# Patient Record
Sex: Male | Born: 1985 | Race: Black or African American | Hispanic: No | Marital: Single
Health system: Southern US, Community
[De-identification: ages and names within clinical notes are randomized; demographics above are authoritative.]

## PROBLEM LIST (undated history)

## (undated) DIAGNOSIS — S27329A Contusion of lung, unspecified, initial encounter: Secondary | ICD-10-CM

---

## 2013-02-24 DIAGNOSIS — W3400XA Accidental discharge from unspecified firearms or gun, initial encounter: Secondary | ICD-10-CM

## 2013-02-24 HISTORY — DX: Accidental discharge from unspecified firearms or gun, initial encounter: W34.00XA

## 2013-03-06 ENCOUNTER — Inpatient Hospital Stay (HOSPITAL_COMMUNITY): Payer: Self-pay

## 2013-03-06 ENCOUNTER — Inpatient Hospital Stay (HOSPITAL_COMMUNITY)
Admission: EM | Admit: 2013-03-06 | Discharge: 2013-03-09 | DRG: 958 | Disposition: A | Discharge status: Home / Self Care | Payer: Self-pay | Attending: General Surgery | Admitting: General Surgery

## 2013-03-06 ENCOUNTER — Emergency Department (HOSPITAL_COMMUNITY): Payer: Self-pay

## 2013-03-06 ENCOUNTER — Inpatient Hospital Stay (HOSPITAL_COMMUNITY): Payer: Self-pay | Admitting: Anesthesiology

## 2013-03-06 ENCOUNTER — Encounter (HOSPITAL_COMMUNITY): Admission: EM | Disposition: A | Discharge status: Home / Self Care | Payer: Self-pay | Source: Home / Self Care

## 2013-03-06 ENCOUNTER — Encounter (HOSPITAL_COMMUNITY): Payer: Self-pay | Admitting: Emergency Medicine

## 2013-03-06 ENCOUNTER — Encounter (HOSPITAL_COMMUNITY): Payer: Self-pay | Admitting: Anesthesiology

## 2013-03-06 DIAGNOSIS — S51839A Puncture wound without foreign body of unspecified forearm, initial encounter: Secondary | ICD-10-CM

## 2013-03-06 DIAGNOSIS — S50851A Superficial foreign body of right forearm, initial encounter: Secondary | ICD-10-CM | POA: Diagnosis present

## 2013-03-06 DIAGNOSIS — S42109A Fracture of unspecified part of scapula, unspecified shoulder, initial encounter for closed fracture: Secondary | ICD-10-CM

## 2013-03-06 DIAGNOSIS — S27329A Contusion of lung, unspecified, initial encounter: Secondary | ICD-10-CM | POA: Diagnosis present

## 2013-03-06 DIAGNOSIS — S21109A Unspecified open wound of unspecified front wall of thorax without penetration into thoracic cavity, initial encounter: Secondary | ICD-10-CM

## 2013-03-06 DIAGNOSIS — S21339A Puncture wound without foreign body of unspecified front wall of thorax with penetration into thoracic cavity, initial encounter: Secondary | ICD-10-CM

## 2013-03-06 DIAGNOSIS — Z1810 Retained metal fragments, unspecified: Secondary | ICD-10-CM

## 2013-03-06 DIAGNOSIS — S20359A Superficial foreign body of unspecified front wall of thorax, initial encounter: Secondary | ICD-10-CM | POA: Diagnosis present

## 2013-03-06 DIAGNOSIS — S52209A Unspecified fracture of shaft of unspecified ulna, initial encounter for closed fracture: Secondary | ICD-10-CM

## 2013-03-06 DIAGNOSIS — F1910 Other psychoactive substance abuse, uncomplicated: Secondary | ICD-10-CM | POA: Diagnosis present

## 2013-03-06 DIAGNOSIS — S2190XA Unspecified open wound of unspecified part of thorax, initial encounter: Secondary | ICD-10-CM

## 2013-03-06 DIAGNOSIS — F1210 Cannabis abuse, uncomplicated: Secondary | ICD-10-CM | POA: Diagnosis present

## 2013-03-06 DIAGNOSIS — S51809A Unspecified open wound of unspecified forearm, initial encounter: Secondary | ICD-10-CM

## 2013-03-06 DIAGNOSIS — S52201A Unspecified fracture of shaft of right ulna, initial encounter for closed fracture: Secondary | ICD-10-CM

## 2013-03-06 DIAGNOSIS — F1110 Opioid abuse, uncomplicated: Secondary | ICD-10-CM | POA: Diagnosis present

## 2013-03-06 DIAGNOSIS — S21331A Puncture wound without foreign body of right front wall of thorax with penetration into thoracic cavity, initial encounter: Secondary | ICD-10-CM

## 2013-03-06 DIAGNOSIS — R042 Hemoptysis: Secondary | ICD-10-CM | POA: Diagnosis present

## 2013-03-06 DIAGNOSIS — F1010 Alcohol abuse, uncomplicated: Secondary | ICD-10-CM | POA: Diagnosis present

## 2013-03-06 DIAGNOSIS — S2239XA Fracture of one rib, unspecified side, initial encounter for closed fracture: Secondary | ICD-10-CM | POA: Diagnosis present

## 2013-03-06 DIAGNOSIS — S52209B Unspecified fracture of shaft of unspecified ulna, initial encounter for open fracture type I or II: Principal | ICD-10-CM | POA: Diagnosis present

## 2013-03-06 DIAGNOSIS — S27309A Unspecified injury of lung, unspecified, initial encounter: Secondary | ICD-10-CM

## 2013-03-06 DIAGNOSIS — S21131A Puncture wound without foreign body of right front wall of thorax without penetration into thoracic cavity, initial encounter: Secondary | ICD-10-CM

## 2013-03-06 DIAGNOSIS — F17200 Nicotine dependence, unspecified, uncomplicated: Secondary | ICD-10-CM | POA: Diagnosis present

## 2013-03-06 DIAGNOSIS — W3400XA Accidental discharge from unspecified firearms or gun, initial encounter: Secondary | ICD-10-CM

## 2013-03-06 DIAGNOSIS — S27321A Contusion of lung, unilateral, initial encounter: Secondary | ICD-10-CM

## 2013-03-06 HISTORY — PX: FOREIGN BODY REMOVAL: SHX962

## 2013-03-06 HISTORY — PX: ORIF ULNAR FRACTURE: SHX5417

## 2013-03-06 LAB — POCT I-STAT, CHEM 8
BUN: 20 mg/dL (ref 6–23)
Calcium, Ion: 1.1 mmol/L — ABNORMAL LOW (ref 1.12–1.23)
Chloride: 103 mEq/L (ref 96–112)
Creatinine, Ser: 1.2 mg/dL (ref 0.50–1.35)
Glucose, Bld: 181 mg/dL — ABNORMAL HIGH (ref 70–99)
HCT: 46 % (ref 39.0–52.0)
Hemoglobin: 15.6 g/dL (ref 13.0–17.0)
Potassium: 3.4 mEq/L — ABNORMAL LOW (ref 3.7–5.3)
Sodium: 140 mEq/L (ref 137–147)
TCO2: 20 mmol/L (ref 0–100)

## 2013-03-06 LAB — COMPREHENSIVE METABOLIC PANEL
ALT: 16 U/L (ref 0–53)
AST: 26 U/L (ref 0–37)
Albumin: 3.8 g/dL (ref 3.5–5.2)
Alkaline Phosphatase: 43 U/L (ref 39–117)
BUN: 20 mg/dL (ref 6–23)
CO2: 21 mEq/L (ref 19–32)
Calcium: 8.2 mg/dL — ABNORMAL LOW (ref 8.4–10.5)
Chloride: 101 mEq/L (ref 96–112)
Creatinine, Ser: 1.21 mg/dL (ref 0.50–1.35)
GFR calc Af Amer: >90 mL/min (ref >90)
GFR calc non Af Amer: 81 mL/min — ABNORMAL LOW (ref >90)
Glucose, Bld: 177 mg/dL — ABNORMAL HIGH (ref 70–99)
Potassium: 3.7 mEq/L (ref 3.7–5.3)
Sodium: 139 mEq/L (ref 137–147)
Total Bilirubin: 0.2 mg/dL — ABNORMAL LOW (ref 0.3–1.2)
Total Protein: 7.2 g/dL (ref 6.0–8.3)

## 2013-03-06 LAB — CBC
HCT: 40.5 % (ref 39.0–52.0)
Hemoglobin: 13.7 g/dL (ref 13.0–17.0)
MCH: 30.9 pg (ref 26.0–34.0)
MCHC: 33.8 g/dL (ref 30.0–36.0)
MCV: 91.4 fL (ref 78.0–100.0)
Platelets: 211 10*3/uL (ref 150–400)
RBC: 4.43 MIL/uL (ref 4.22–5.81)
RDW: 12.4 % (ref 11.5–15.5)
WBC: 13 10*3/uL — ABNORMAL HIGH (ref 4.0–10.5)

## 2013-03-06 LAB — CG4 I-STAT (LACTIC ACID): Lactic Acid, Venous: 4.18 mmol/L — ABNORMAL HIGH (ref 0.5–2.2)

## 2013-03-06 LAB — PROTIME-INR
INR: 1.24 (ref 0.00–1.49)
Prothrombin Time: 15.3 seconds — ABNORMAL HIGH (ref 11.6–15.2)

## 2013-03-06 LAB — MRSA PCR SCREENING: MRSA by PCR: NEGATIVE

## 2013-03-06 LAB — SAMPLE TO BLOOD BANK

## 2013-03-06 SURGERY — OPEN REDUCTION INTERNAL FIXATION (ORIF) ULNAR FRACTURE
Anesthesia: General | Site: Chest | Laterality: Right

## 2013-03-06 MED ORDER — POLYETHYLENE GLYCOL 3350 17 G PO PACK
17.0000 g | PACK | Freq: Every day | ORAL | Status: DC | PRN
  Filled 2013-03-06: qty 1

## 2013-03-06 MED ORDER — ONDANSETRON HCL 4 MG/2ML IJ SOLN: 4.0000 mg | Freq: Four times a day (QID) | INTRAMUSCULAR | Status: DC | PRN

## 2013-03-06 MED ORDER — PROMETHAZINE HCL 25 MG/ML IJ SOLN: 6.2500 mg | INTRAMUSCULAR | Status: DC | PRN

## 2013-03-06 MED ORDER — PHENOL 1.4 % MT LIQD: 1.0000 | OROMUCOSAL | Status: DC | PRN

## 2013-03-06 MED ORDER — ROCURONIUM BROMIDE 100 MG/10ML IV SOLN
INTRAVENOUS | Status: DC | PRN
  Administered 2013-03-06: 20 mg via INTRAVENOUS

## 2013-03-06 MED ORDER — METHOCARBAMOL 100 MG/ML IJ SOLN
500.0000 mg | Freq: Four times a day (QID) | INTRAVENOUS | Status: DC | PRN
  Filled 2013-03-06: qty 5

## 2013-03-06 MED ORDER — HYDROMORPHONE HCL PF 1 MG/ML IJ SOLN
INTRAMUSCULAR | Status: AC
  Filled 2013-03-06: qty 1

## 2013-03-06 MED ORDER — CEFAZOLIN SODIUM 1-5 GM-% IV SOLN: 1.0000 g | INTRAVENOUS | Status: DC

## 2013-03-06 MED ORDER — OXYCODONE HCL 5 MG/5ML PO SOLN: 5.0000 mg | Freq: Once | ORAL | Status: DC | PRN

## 2013-03-06 MED ORDER — OXYCODONE HCL 5 MG PO TABS
5.0000 mg | ORAL_TABLET | ORAL | Status: DC | PRN
  Administered 2013-03-07 – 2013-03-08 (×4): 10 mg via ORAL
  Filled 2013-03-06 (×4): qty 2

## 2013-03-06 MED ORDER — SUCCINYLCHOLINE CHLORIDE 20 MG/ML IJ SOLN
INTRAMUSCULAR | Status: DC | PRN
  Administered 2013-03-06: 120 mg via INTRAVENOUS

## 2013-03-06 MED ORDER — TETANUS-DIPHTHERIA TOXOIDS TD 5-2 LFU IM INJ: 0.5000 mL | INJECTION | Freq: Once | INTRAMUSCULAR | Status: DC

## 2013-03-06 MED ORDER — FENTANYL CITRATE 0.05 MG/ML IJ SOLN
INTRAMUSCULAR | Status: DC | PRN
  Administered 2013-03-06: 50 ug via INTRAVENOUS
  Administered 2013-03-06: 100 ug via INTRAVENOUS
  Administered 2013-03-06: 50 ug via INTRAVENOUS
  Administered 2013-03-06: 100 ug via INTRAVENOUS
  Administered 2013-03-06: 50 ug via INTRAVENOUS

## 2013-03-06 MED ORDER — HYDROMORPHONE HCL PF 1 MG/ML IJ SOLN
0.2500 mg | INTRAMUSCULAR | Status: DC | PRN
  Administered 2013-03-06 (×4): 0.5 mg via INTRAVENOUS

## 2013-03-06 MED ORDER — FENTANYL CITRATE 0.05 MG/ML IJ SOLN
50.0000 ug | Freq: Once | INTRAMUSCULAR | Status: AC
  Administered 2013-03-06: 50 ug via INTRAVENOUS

## 2013-03-06 MED ORDER — DIPHENHYDRAMINE HCL 50 MG/ML IJ SOLN
INTRAMUSCULAR | Status: DC | PRN
  Administered 2013-03-06: 25 mg via INTRAVENOUS

## 2013-03-06 MED ORDER — PANTOPRAZOLE SODIUM 40 MG PO TBEC: 40.0000 mg | DELAYED_RELEASE_TABLET | Freq: Every day | ORAL | Status: DC

## 2013-03-06 MED ORDER — ONDANSETRON HCL 4 MG PO TABS: 4.0000 mg | ORAL_TABLET | Freq: Four times a day (QID) | ORAL | Status: DC | PRN

## 2013-03-06 MED ORDER — PANTOPRAZOLE SODIUM 40 MG IV SOLR
40.0000 mg | Freq: Every day | INTRAVENOUS | Status: DC
  Administered 2013-03-06 – 2013-03-07 (×2): 40 mg via INTRAVENOUS
  Filled 2013-03-06 (×3): qty 40

## 2013-03-06 MED ORDER — ALUM & MAG HYDROXIDE-SIMETH 200-200-20 MG/5ML PO SUSP: 30.0000 mL | ORAL | Status: DC | PRN

## 2013-03-06 MED ORDER — PROPOFOL 10 MG/ML IV BOLUS
INTRAVENOUS | Status: DC | PRN
  Administered 2013-03-06: 150 mg via INTRAVENOUS

## 2013-03-06 MED ORDER — TETANUS-DIPHTH-ACELL PERTUSSIS 5-2.5-18.5 LF-MCG/0.5 IM SUSP
INTRAMUSCULAR | Status: AC
  Administered 2013-03-06: 0.5 mL via INTRAMUSCULAR
  Filled 2013-03-06: qty 0.5

## 2013-03-06 MED ORDER — LIDOCAINE HCL (CARDIAC) 20 MG/ML IV SOLN
INTRAVENOUS | Status: DC | PRN
  Administered 2013-03-06: 50 mg via INTRAVENOUS

## 2013-03-06 MED ORDER — SENNA 8.6 MG PO TABS
1.0000 | ORAL_TABLET | Freq: Two times a day (BID) | ORAL | Status: DC
  Administered 2013-03-07 – 2013-03-09 (×5): 8.6 mg via ORAL
  Filled 2013-03-06 (×7): qty 1

## 2013-03-06 MED ORDER — BUPIVACAINE HCL (PF) 0.25 % IJ SOLN
INTRAMUSCULAR | Status: AC
  Filled 2013-03-06: qty 30

## 2013-03-06 MED ORDER — HYDROMORPHONE HCL PF 1 MG/ML IJ SOLN
1.0000 mg | INTRAMUSCULAR | Status: DC | PRN
  Administered 2013-03-06 (×4): 1 mg via INTRAVENOUS
  Administered 2013-03-07 (×2): 2 mg via INTRAVENOUS
  Administered 2013-03-07: 1 mg via INTRAVENOUS
  Administered 2013-03-07: 2 mg via INTRAVENOUS
  Administered 2013-03-07: 1 mg via INTRAVENOUS
  Administered 2013-03-07 – 2013-03-08 (×7): 2 mg via INTRAVENOUS
  Filled 2013-03-06: qty 2
  Filled 2013-03-06: qty 1
  Filled 2013-03-06: qty 2
  Filled 2013-03-06 (×2): qty 1
  Filled 2013-03-06: qty 2
  Filled 2013-03-06: qty 1
  Filled 2013-03-06 (×4): qty 2
  Filled 2013-03-06: qty 1
  Filled 2013-03-06 (×3): qty 2

## 2013-03-06 MED ORDER — CEFAZOLIN SODIUM 1-5 GM-% IV SOLN
1.0000 g | Freq: Three times a day (TID) | INTRAVENOUS | Status: DC
  Administered 2013-03-06 (×2): 1 g via INTRAVENOUS
  Filled 2013-03-06 (×3): qty 50

## 2013-03-06 MED ORDER — ONDANSETRON HCL 4 MG/2ML IJ SOLN
INTRAMUSCULAR | Status: DC | PRN
  Administered 2013-03-06: 4 mg via INTRAVENOUS

## 2013-03-06 MED ORDER — ACETAMINOPHEN 325 MG PO TABS: 650.0000 mg | ORAL_TABLET | Freq: Four times a day (QID) | ORAL | Status: DC | PRN

## 2013-03-06 MED ORDER — DIPHENHYDRAMINE HCL 12.5 MG/5ML PO ELIX
12.5000 mg | ORAL_SOLUTION | ORAL | Status: DC | PRN
Start: 2013-03-06 — End: 2013-03-09
  Filled 2013-03-06: qty 10

## 2013-03-06 MED ORDER — BISACODYL 10 MG RE SUPP: 10.0000 mg | Freq: Every day | RECTAL | Status: DC | PRN

## 2013-03-06 MED ORDER — POTASSIUM CHLORIDE IN NACL 20-0.9 MEQ/L-% IV SOLN
INTRAVENOUS | Status: DC
  Administered 2013-03-06 – 2013-03-07 (×2): via INTRAVENOUS
  Filled 2013-03-06 (×4): qty 1000

## 2013-03-06 MED ORDER — LACTATED RINGERS IV SOLN
INTRAVENOUS | Status: DC | PRN
  Administered 2013-03-06 (×2): via INTRAVENOUS

## 2013-03-06 MED ORDER — GLYCOPYRROLATE 0.2 MG/ML IJ SOLN
INTRAMUSCULAR | Status: DC | PRN
  Administered 2013-03-06: 0.4 mg via INTRAVENOUS

## 2013-03-06 MED ORDER — NEOSTIGMINE METHYLSULFATE 1 MG/ML IJ SOLN
INTRAMUSCULAR | Status: DC | PRN
  Administered 2013-03-06: 3 mg via INTRAVENOUS

## 2013-03-06 MED ORDER — OXYCODONE HCL 5 MG PO TABS: 5.0000 mg | ORAL_TABLET | Freq: Once | ORAL | Status: DC | PRN

## 2013-03-06 MED ORDER — METHOCARBAMOL 500 MG PO TABS
500.0000 mg | ORAL_TABLET | Freq: Four times a day (QID) | ORAL | Status: DC | PRN
  Administered 2013-03-08 (×2): 500 mg via ORAL
  Filled 2013-03-06 (×3): qty 1

## 2013-03-06 MED ORDER — ACETAMINOPHEN 650 MG RE SUPP: 650.0000 mg | Freq: Four times a day (QID) | RECTAL | Status: DC | PRN

## 2013-03-06 MED ORDER — BUPIVACAINE HCL (PF) 0.25 % IJ SOLN
INTRAMUSCULAR | Status: DC | PRN
  Administered 2013-03-06: 20 mL

## 2013-03-06 MED ORDER — IOHEXOL 300 MG/ML  SOLN
80.0000 mL | Freq: Once | INTRAMUSCULAR | Status: AC | PRN
  Administered 2013-03-06: 80 mL via INTRAVENOUS

## 2013-03-06 MED ORDER — FENTANYL CITRATE 0.05 MG/ML IJ SOLN
INTRAMUSCULAR | Status: AC
  Filled 2013-03-06: qty 2

## 2013-03-06 MED ORDER — MIDAZOLAM HCL 5 MG/5ML IJ SOLN
INTRAMUSCULAR | Status: DC | PRN
  Administered 2013-03-06: 2 mg via INTRAVENOUS

## 2013-03-06 MED ORDER — MENTHOL 3 MG MT LOZG: 1.0000 | LOZENGE | OROMUCOSAL | Status: DC | PRN

## 2013-03-06 MED ORDER — HYDROMORPHONE HCL PF 1 MG/ML IJ SOLN
INTRAMUSCULAR | Status: AC
  Administered 2013-03-06: 1 mg via INTRAVENOUS
  Filled 2013-03-06: qty 1

## 2013-03-06 MED ORDER — HYDROMORPHONE HCL PF 1 MG/ML IJ SOLN
1.0000 mg | Freq: Once | INTRAMUSCULAR | Status: AC
  Administered 2013-03-06: 1 mg via INTRAVENOUS
  Filled 2013-03-06: qty 1

## 2013-03-06 MED ORDER — CEFAZOLIN SODIUM 1-5 GM-% IV SOLN
1.0000 g | Freq: Four times a day (QID) | INTRAVENOUS | Status: AC
  Administered 2013-03-07 (×3): 1 g via INTRAVENOUS
  Filled 2013-03-06 (×4): qty 50

## 2013-03-06 MED ORDER — ZOLPIDEM TARTRATE 5 MG PO TABS: 5.0000 mg | ORAL_TABLET | Freq: Every evening | ORAL | Status: DC | PRN

## 2013-03-06 MED ORDER — DOCUSATE SODIUM 100 MG PO CAPS
100.0000 mg | ORAL_CAPSULE | Freq: Two times a day (BID) | ORAL | Status: DC
  Administered 2013-03-07 – 2013-03-09 (×5): 100 mg via ORAL
  Filled 2013-03-06 (×8): qty 1

## 2013-03-06 MED ORDER — OXYCODONE-ACETAMINOPHEN 5-325 MG PO TABS
1.0000 | ORAL_TABLET | ORAL | Status: DC | PRN
  Filled 2013-03-06: qty 2

## 2013-03-06 MED ORDER — CEFAZOLIN SODIUM 1-5 GM-% IV SOLN
1.0000 g | Freq: Once | INTRAVENOUS | Status: AC
  Administered 2013-03-06: 2 g via INTRAVENOUS

## 2013-03-06 MED ORDER — ENOXAPARIN SODIUM 40 MG/0.4ML ~~LOC~~ SOLN
40.0000 mg | Freq: Every day | SUBCUTANEOUS | Status: DC
  Administered 2013-03-07 – 2013-03-09 (×3): 40 mg via SUBCUTANEOUS
  Filled 2013-03-06 (×3): qty 0.4

## 2013-03-06 SURGICAL SUPPLY — 89 items
BANDAGE ELASTIC 3 VELCRO ST LF (GAUZE/BANDAGES/DRESSINGS) ×4 IMPLANT
BANDAGE ELASTIC 4 VELCRO ST LF (GAUZE/BANDAGES/DRESSINGS) ×4 IMPLANT
BENZOIN TINCTURE PRP APPL 2/3 (GAUZE/BANDAGES/DRESSINGS) ×4 IMPLANT
BIT DRILL 2.5X110 QC LCP DISP (BIT) ×4 IMPLANT
BLADE SURG ROTATE 9660 (MISCELLANEOUS) IMPLANT
BNDG COHESIVE 4X5 TAN STRL (GAUZE/BANDAGES/DRESSINGS) ×4 IMPLANT
BNDG ESMARK 4X9 LF (GAUZE/BANDAGES/DRESSINGS) ×4 IMPLANT
CLOSURE STERI-STRIP 1/2X4 (GAUZE/BANDAGES/DRESSINGS) ×1
CLOTH BEACON ORANGE TIMEOUT ST (SAFETY) ×4 IMPLANT
CLSR STERI-STRIP ANTIMIC 1/2X4 (GAUZE/BANDAGES/DRESSINGS) ×3 IMPLANT
CONT SPEC 4OZ CLIKSEAL STRL BL (MISCELLANEOUS) ×8 IMPLANT
CORDS BIPOLAR (ELECTRODE) ×4 IMPLANT
COVER SURGICAL LIGHT HANDLE (MISCELLANEOUS) ×4 IMPLANT
CUFF TOURNIQUET SINGLE 18IN (TOURNIQUET CUFF) ×4 IMPLANT
CUFF TOURNIQUET SINGLE 24IN (TOURNIQUET CUFF) IMPLANT
DECANTER SPIKE VIAL GLASS SM (MISCELLANEOUS) IMPLANT
DRAPE C-ARM 42X72 X-RAY (DRAPES) ×4 IMPLANT
DRAPE INCISE IOBAN 66X45 STRL (DRAPES) IMPLANT
DRAPE OEC MINIVIEW 54X84 (DRAPES) IMPLANT
DRAPE ORTHO SPLIT 77X108 STRL (DRAPES) ×2
DRAPE SURG ORHT 6 SPLT 77X108 (DRAPES) ×2 IMPLANT
DRAPE U-SHAPE 47X51 STRL (DRAPES) ×4 IMPLANT
DRSG MEPILEX BORDER 4X4 (GAUZE/BANDAGES/DRESSINGS) ×4 IMPLANT
ELECT REM PT RETURN 9FT ADLT (ELECTROSURGICAL)
ELECTRODE REM PT RTRN 9FT ADLT (ELECTROSURGICAL) IMPLANT
GAUZE XEROFORM 1X8 LF (GAUZE/BANDAGES/DRESSINGS) ×4 IMPLANT
GLOVE BIO SURGEON STRL SZ7.5 (GLOVE) ×4 IMPLANT
GLOVE BIO SURGEON STRL SZ8 (GLOVE) ×8 IMPLANT
GLOVE BIOGEL PI IND STRL 8 (GLOVE) ×2 IMPLANT
GLOVE BIOGEL PI INDICATOR 8 (GLOVE) ×2
GLOVE BIOGEL PI ORTHO PRO SZ8 (GLOVE) ×2
GLOVE ORTHO TXT STRL SZ7.5 (GLOVE) IMPLANT
GLOVE PI ORTHO PRO STRL SZ8 (GLOVE) ×2 IMPLANT
GLOVE SS BIOGEL STRL SZ 8 (GLOVE) ×4 IMPLANT
GLOVE SUPERSENSE BIOGEL SZ 8 (GLOVE) ×4
GLOVE SURG ORTHO 8.0 STRL STRW (GLOVE) ×4 IMPLANT
GOWN BRE IMP SLV AUR XL STRL (GOWN DISPOSABLE) ×4 IMPLANT
GOWN STRL NON-REIN LRG LVL3 (GOWN DISPOSABLE) ×8 IMPLANT
GOWN STRL REIN 3XL LVL4 (GOWN DISPOSABLE) ×4 IMPLANT
KIT BASIN OR (CUSTOM PROCEDURE TRAY) ×4 IMPLANT
KIT ROOM TURNOVER OR (KITS) ×4 IMPLANT
LOOP VESSEL MAXI BLUE (MISCELLANEOUS) IMPLANT
MANIFOLD NEPTUNE II (INSTRUMENTS) ×4 IMPLANT
NEEDLE 22X1 1/2 (OR ONLY) (NEEDLE) IMPLANT
NEEDLE BLUNT 16X1.5 OR ONLY (NEEDLE) IMPLANT
NS IRRIG 1000ML POUR BTL (IV SOLUTION) ×4 IMPLANT
PACK ORTHO EXTREMITY (CUSTOM PROCEDURE TRAY) ×4 IMPLANT
PAD ARMBOARD 7.5X6 YLW CONV (MISCELLANEOUS) ×8 IMPLANT
PAD CAST 4YDX4 CTTN HI CHSV (CAST SUPPLIES) ×4 IMPLANT
PADDING CAST ABS 4INX4YD NS (CAST SUPPLIES) ×2
PADDING CAST ABS COTTON 4X4 ST (CAST SUPPLIES) ×2 IMPLANT
PADDING CAST COTTON 4X4 STRL (CAST SUPPLIES) ×4
PROS LCP PLATE 14 189M (Plate) ×4 IMPLANT
PROSTHESIS LCP PLATE 14 189M (Plate) ×2 IMPLANT
SCREW CORTEX 3.5 16MM (Screw) ×4 IMPLANT
SCREW CORTEX 3.5 18MM (Screw) ×4 IMPLANT
SCREW CORTEX 3.5 22MM (Screw) ×2 IMPLANT
SCREW CORTEX 3.5 26MM (Screw) ×2 IMPLANT
SCREW CORTEX 3.5 28MM (Screw) ×2 IMPLANT
SCREW CORTEX 3.5 32MM (Screw) ×2 IMPLANT
SCREW LOCK CORT ST 3.5X16 (Screw) ×4 IMPLANT
SCREW LOCK CORT ST 3.5X18 (Screw) ×4 IMPLANT
SCREW LOCK CORT ST 3.5X22 (Screw) ×2 IMPLANT
SCREW LOCK CORT ST 3.5X26 (Screw) ×2 IMPLANT
SCREW LOCK CORT ST 3.5X28 (Screw) ×2 IMPLANT
SCREW LOCK CORT ST 3.5X32 (Screw) ×2 IMPLANT
SLING ARM FOAM STRAP LRG (SOFTGOODS) ×4 IMPLANT
SPLINT PLASTER CAST XFAST 5X30 (CAST SUPPLIES) ×2 IMPLANT
SPLINT PLASTER XFAST SET 5X30 (CAST SUPPLIES) ×2
SPONGE GAUZE 4X4 12PLY (GAUZE/BANDAGES/DRESSINGS) ×12 IMPLANT
SPONGE GAUZE 4X4 12PLY STER LF (GAUZE/BANDAGES/DRESSINGS) ×4 IMPLANT
SPONGE LAP 4X18 X RAY DECT (DISPOSABLE) IMPLANT
STAPLER VISISTAT 35W (STAPLE) IMPLANT
SUCTION FRAZIER TIP 10 FR DISP (SUCTIONS) ×4 IMPLANT
SUT ETHILON 3 0 PS 1 (SUTURE) ×8 IMPLANT
SUT ETHILON 4 0 PS 2 18 (SUTURE) IMPLANT
SUT MNCRL AB 4-0 PS2 18 (SUTURE) ×4 IMPLANT
SUT PROLENE 4 0 PS 2 18 (SUTURE) IMPLANT
SUT VIC AB 0 CT1 27 (SUTURE) ×2
SUT VIC AB 0 CT1 27XBRD ANBCTR (SUTURE) ×2 IMPLANT
SUT VIC AB 3-0 FS2 27 (SUTURE) IMPLANT
SUT VIC AB 3-0 SH 8-18 (SUTURE) ×4 IMPLANT
SYR CONTROL 10ML LL (SYRINGE) ×4 IMPLANT
TOWEL OR 17X24 6PK STRL BLUE (TOWEL DISPOSABLE) ×4 IMPLANT
TOWEL OR 17X26 10 PK STRL BLUE (TOWEL DISPOSABLE) ×4 IMPLANT
TUBE CONNECTING 12'X1/4 (SUCTIONS) ×2
TUBE CONNECTING 12X1/4 (SUCTIONS) ×6 IMPLANT
WATER STERILE IRR 1000ML POUR (IV SOLUTION) IMPLANT
YANKAUER SUCT BULB TIP NO VENT (SUCTIONS) IMPLANT

## 2013-03-06 NOTE — Progress Notes (Signed)
Orthopedic Tech Progress Note Patient Details:  [name] Ford 04/02/1985 [id number]  Ortho Devices Type of Ortho Device: Ace wrap;Long arm splint Ortho Device/Splint Location: rue Ortho Device/Splint Interventions: Application   Ronnell Makarewicz 03/06/2013, 9:26 AM

## 2013-03-06 NOTE — ED Notes (Signed)
md at bedside to eval pt. Pt assisted with urinal and spo2 dropped to 85%, placed on 2L [place] and spo2 99%.

## 2013-03-06 NOTE — ED Notes (Signed)
Patient transported to CT with RN present. 
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
## 2013-03-06 NOTE — ED Notes (Signed)
Ortho md at bedside to eval pt
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
## 2013-03-06 NOTE — Transfer of Care (Signed)
Immediate Anesthesia Transfer of Care Note  Patient: [name] Ford  Procedure(s) Performed: Procedure(s): OPEN REDUCTION INTERNAL FIXATION (ORIF) ULNAR FRACTURE (Right) REMOVAL FOREIGN BODY RIGHT CHEST (Right)  Patient Location: PACU  Anesthesia Type:General  Level of Consciousness: sedated  Airway & Oxygen Therapy: Patient Spontanous Breathing and Patient connected to face mask oxygen  Post-op Assessment: Report given to PACU RN and Post -op Vital signs reviewed and stable  Post vital signs: Reviewed and stable  Complications: No apparent anesthesia complications

## 2013-03-06 NOTE — Anesthesia Procedure Notes (Signed)
Procedure Name: Intubation Date/Time: 03/06/2013 7:20 PM Performed by: Brien MatesMAHONY, Mj Willis D Pre-anesthesia Checklist: Patient identified, Emergency Drugs available, Suction available, Patient being monitored and Timeout performed Patient Re-evaluated:Patient Re-evaluated prior to inductionOxygen Delivery Method: Circle system utilized Preoxygenation: Pre-oxygenation with 100% oxygen Intubation Type: IV induction, Rapid sequence and Cricoid Pressure applied Laryngoscope Size: Miller and 2 Grade View: Grade I Tube type: Oral Tube size: 8.0 mm Number of attempts: 1 Airway Equipment and Method: Stylet Placement Confirmation: ETT inserted through vocal cords under direct vision,  positive ETCO2 and breath sounds checked- equal and bilateral Secured at: 22 cm Tube secured with: Tape Dental Injury: Teeth and Oropharynx as per pre-operative assessment

## 2013-03-06 NOTE — ED Notes (Signed)
50 mcg of fentanyl given to patient by Maryellen PileEd White RN, the other 50 mcg were wasted. Waste witnessed by Diona FoleyMorgan Barri Neidlinger RN, Vesta MixerKoula Pate RN
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
## 2013-03-06 NOTE — Preoperative (Signed)
Beta Blockers   Reason not to administer Beta Blockers:Not Applicable 
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
## 2013-03-06 NOTE — Anesthesia Preprocedure Evaluation (Addendum)
Anesthesia Evaluation  Patient identified by MRN, date of birth, ID band Patient awake    Reviewed: Allergy & Precautions, H&P , NPO status , Patient's Chart, lab work & pertinent test results  Airway Mallampati: I  Neck ROM: Full    Dental  (+) Dental Advisory Given and Teeth Intact   Pulmonary Current Smoker,  + rhonchi         Cardiovascular Rhythm:Regular Rate:Normal     Neuro/Psych    GI/Hepatic   Endo/Other    Renal/GU      Musculoskeletal   Abdominal   Peds  Hematology   Anesthesia Other Findings   Reproductive/Obstetrics                         Anesthesia Physical Anesthesia Plan  ASA: I and emergent  Anesthesia Plan: General   Post-op Pain Management:    Induction: Intravenous  Airway Management Planned: Oral ETT  Additional Equipment:   Intra-op Plan:   Post-operative Plan: Extubation in OR  Informed Consent: I have reviewed the patients History and Physical, chart, labs and discussed the procedure including the risks, benefits and alternatives for the proposed anesthesia with the patient or authorized representative who has indicated his/her understanding and acceptance.   Dental advisory given  Plan Discussed with: CRNA, Surgeon and Anesthesiologist  Anesthesia Plan Comments:        Anesthesia Quick Evaluation

## 2013-03-06 NOTE — Anesthesia Postprocedure Evaluation (Signed)
  Anesthesia Post-op Note  Patient: [name] Ford  Procedure(s) Performed: Procedure(s): OPEN REDUCTION INTERNAL FIXATION (ORIF) ULNAR FRACTURE (Right) REMOVAL FOREIGN BODY RIGHT CHEST (Right)  Patient Location: PACU  Anesthesia Type:General  Level of Consciousness: awake and sedated  Airway and Oxygen Therapy: Patient Spontanous Breathing  Post-op Pain: mild  Post-op Assessment: Post-op Vital signs reviewed  Post-op Vital Signs: stable  Complications: No apparent anesthesia complications

## 2013-03-06 NOTE — Progress Notes (Signed)
Pt taken to short stay waiting and bedside [facility]. Pt resting with no complaints or distress
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
## 2013-03-06 NOTE — ED Notes (Signed)
Ortho tech here to apply arm splint

## 2013-03-07 ENCOUNTER — Inpatient Hospital Stay (HOSPITAL_COMMUNITY): Payer: Self-pay

## 2013-03-07 ENCOUNTER — Encounter (HOSPITAL_COMMUNITY): Payer: Self-pay | Admitting: Orthopedic Surgery

## 2013-03-07 DIAGNOSIS — S27321A Contusion of lung, unilateral, initial encounter: Secondary | ICD-10-CM

## 2013-03-07 LAB — BASIC METABOLIC PANEL
BUN: 9 mg/dL (ref 6–23)
CO2: 23 mEq/L (ref 19–32)
Calcium: 8.1 mg/dL — ABNORMAL LOW (ref 8.4–10.5)
Chloride: 104 mEq/L (ref 96–112)
Creatinine, Ser: 1.02 mg/dL (ref 0.50–1.35)
GFR calc Af Amer: >90 mL/min (ref >90)
GFR calc non Af Amer: >90 mL/min (ref >90)
Glucose, Bld: 103 mg/dL — ABNORMAL HIGH (ref 70–99)
Potassium: 4 mEq/L (ref 3.7–5.3)
Sodium: 138 mEq/L (ref 137–147)

## 2013-03-07 LAB — CBC
HCT: 37.1 % — ABNORMAL LOW (ref 39.0–52.0)
Hemoglobin: 13.3 g/dL (ref 13.0–17.0)
MCH: 31.9 pg (ref 26.0–34.0)
MCHC: 35.8 g/dL (ref 30.0–36.0)
MCV: 89 fL (ref 78.0–100.0)
Platelets: 197 10*3/uL (ref 150–400)
RBC: 4.17 MIL/uL — ABNORMAL LOW (ref 4.22–5.81)
RDW: 12.2 % (ref 11.5–15.5)
WBC: 10.4 10*3/uL (ref 4.0–10.5)

## 2013-03-07 NOTE — Progress Notes (Signed)
Orthopedic Tech Progress Note Patient Details:  [name] 01/22/1986 [id number] OHF applied to bed. Arm sling provided for comfort use; not applied at this time because patient was asleep. Application instructions explained to patient's family.   Ortho Devices Type of Ortho Device: Arm sling Ortho Device/Splint Location: rue Ortho Device/Splint Interventions: Ordered   Asia R Janee Mornhompson 03/07/2013, 1:09 PM
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
## 2013-03-07 NOTE — Progress Notes (Signed)
OT Cancellation Note  Patient Details Name: [name] Ford MRN: [id number] DOB: 08/18/1985   Cancelled Treatment:    Reason Eval/Treat Not Completed: Pain limiting ability to participate (pt asking for therapist to return later. Will see in am)  [facility] Childrens HouseWARD,HILLARY Daoud Lobue, OTR/L  [phone] 03/07/2013 03/07/2013, 5:35 PM
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
## 2013-03-08 ENCOUNTER — Encounter (HOSPITAL_COMMUNITY): Payer: Self-pay | Admitting: General Practice

## 2013-03-08 DIAGNOSIS — S51839A Puncture wound without foreign body of unspecified forearm, initial encounter: Secondary | ICD-10-CM

## 2013-03-08 DIAGNOSIS — W3400XA Accidental discharge from unspecified firearms or gun, initial encounter: Secondary | ICD-10-CM

## 2013-03-08 LAB — CBC
HCT: 36.9 % — ABNORMAL LOW (ref 39.0–52.0)
Hemoglobin: 13 g/dL (ref 13.0–17.0)
MCH: 31.6 pg (ref 26.0–34.0)
MCHC: 35.2 g/dL (ref 30.0–36.0)
MCV: 89.8 fL (ref 78.0–100.0)
Platelets: 153 10*3/uL (ref 150–400)
RBC: 4.11 MIL/uL — ABNORMAL LOW (ref 4.22–5.81)
RDW: 12.2 % (ref 11.5–15.5)
WBC: 9.7 10*3/uL (ref 4.0–10.5)

## 2013-03-08 MED ORDER — TRAMADOL HCL 50 MG PO TABS
100.0000 mg | ORAL_TABLET | Freq: Four times a day (QID) | ORAL | Status: DC
  Administered 2013-03-08 – 2013-03-09 (×4): 100 mg via ORAL
  Filled 2013-03-08 (×4): qty 2

## 2013-03-08 MED ORDER — HYDROMORPHONE HCL PF 1 MG/ML IJ SOLN
0.5000 mg | INTRAMUSCULAR | Status: DC | PRN
  Administered 2013-03-08 – 2013-03-09 (×2): 0.5 mg via INTRAVENOUS
  Filled 2013-03-08 (×3): qty 1

## 2013-03-08 MED ORDER — MORPHINE SULFATE ER 15 MG PO TBCR
15.0000 mg | EXTENDED_RELEASE_TABLET | Freq: Two times a day (BID) | ORAL | Status: DC
  Administered 2013-03-08 – 2013-03-09 (×2): 15 mg via ORAL
  Filled 2013-03-08 (×3): qty 1

## 2013-03-08 MED ORDER — OXYCODONE HCL 5 MG PO TABS
10.0000 mg | ORAL_TABLET | ORAL | Status: DC | PRN
  Administered 2013-03-08 – 2013-03-09 (×5): 20 mg via ORAL
  Filled 2013-03-08 (×5): qty 4

## 2013-03-08 MED ORDER — HYDROMORPHONE HCL PF 1 MG/ML IJ SOLN
1.0000 mg | INTRAMUSCULAR | Status: DC | PRN
  Administered 2013-03-08 (×2): 1 mg via INTRAVENOUS
  Filled 2013-03-08 (×2): qty 1
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
## 2013-03-08 NOTE — H&P (Signed)
Patient agitated and stated he has already answered these questions .  Writer informed patient that she only intends to ask questions that where not documented and patient angry and refuses to answer . Writer stopped admission assessment for patient comfort /satisfaction.

## 2013-03-08 NOTE — Progress Notes (Signed)
UR completed.  Arihant Pennings, RN BSN [facility] CCM Trauma/Neuro ICU Case Manager [phone]  
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
## 2013-03-08 NOTE — Progress Notes (Signed)
Walking in hall with therapies. Working on pain control which is an issue - likely complicated by his recreational use of prescription pain meds. Patient examined and I agree with the assessment and plan  Violeta GelinasBurke Hallel Denherder, MD, MPH, FACS Pager: [phone]  03/08/2013 12:35 PM

## 2013-03-08 NOTE — Progress Notes (Signed)
Agree with PTA.    Koreen Lizaola, PT [phone]  

## 2013-03-09 DIAGNOSIS — F1910 Other psychoactive substance abuse, uncomplicated: Secondary | ICD-10-CM | POA: Diagnosis present

## 2013-03-09 DIAGNOSIS — S20359A Superficial foreign body of unspecified front wall of thorax, initial encounter: Secondary | ICD-10-CM | POA: Diagnosis present

## 2013-03-09 DIAGNOSIS — S50851A Superficial foreign body of right forearm, initial encounter: Secondary | ICD-10-CM | POA: Diagnosis present

## 2013-03-09 MED ORDER — TRAMADOL HCL 50 MG PO TABS
100.0000 mg | ORAL_TABLET | Freq: Four times a day (QID) | ORAL | Status: DC
Start: 2013-03-09 — End: 2019-06-01

## 2013-03-09 MED ORDER — METHOCARBAMOL 500 MG PO TABS: 500.0000 mg | ORAL_TABLET | Freq: Four times a day (QID) | ORAL | Status: DC | PRN

## 2013-03-09 MED ORDER — MORPHINE SULFATE ER 15 MG PO TBCR: EXTENDED_RELEASE_TABLET | ORAL | Status: DC

## 2013-03-09 MED ORDER — OXYCODONE-ACETAMINOPHEN 10-325 MG PO TABS: 1.0000 | ORAL_TABLET | ORAL | Status: DC | PRN
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
## 2013-03-09 NOTE — Progress Notes (Signed)
Stable for discharge This patient has been seen and I agree with the findings and treatment plan.  Marta LamasJames O. Gae BonWyatt, III, MD, FACS [phone] (pager) [phone] (direct pager) Trauma Surgeon
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
## 2013-03-09 NOTE — Discharge Summary (Signed)
Patient stable for discharge.  This patient has been seen and I agree with the findings and treatment plan.  Marta LamasJames O. Gae BonWyatt, III, MD, FACS [phone] (pager) [phone] (direct pager) Trauma Surgeon

## 2013-03-09 NOTE — Discharge Instructions (Signed)
Ulnar Fracture You have a fracture (broken bone) of the forearm. This is the part of your arm between the elbow and your wrist. Your forearm is made up of two bones. These are the radius and ulna. Your fracture is in the ulna. This is the bone in your forearm located on the little finger side of your forearm. A cast or splint is used to protect and keep your injured bone from moving. The cast or splint will be on generally for about 5 to 6 weeks, with individual variations. HOME CARE INSTRUCTIONS   Keep the injured part elevated while sitting or lying down. Keep the injury above the level of your heart (the center of the chest). This will decrease swelling and pain.  Apply ice to the injury for 15-20 minutes, 03-04 times per day while awake, for 2 days. Put the ice in a plastic bag and place a towel between the bag of ice and your cast or splint.  Move your fingers to avoid stiffness and minimize swelling.  If you have a plaster or fiberglass cast:  Do not try to scratch the skin under the cast using sharp or pointed objects.  Check the skin around the cast every day. You may put lotion on any red or sore areas.  Keep your cast dry and clean.  If you have a plaster splint:  Wear the splint as directed.  You may loosen the elastic around the splint if your fingers become numb, tingle, or turn cold or blue.  Do not put pressure on any part of your cast or splint. It may break. Rest your cast only on a pillow the first 24 hours until it is fully hardened.  Your cast or splint can be protected during bathing with a plastic bag. Do not lower the cast or splint into water.  Only take over-the-counter or prescription medicines for pain, discomfort, or fever as directed by your caregiver. SEEK IMMEDIATE MEDICAL CARE IF:   Your cast gets damaged or breaks.  You have more severe pain or swelling than you did before the cast.  You have severe pain when stretching your fingers.  There is a  bad smell or new stains and/or purulent (pus like) drainage coming from under the cast. Document Released: 07/24/2005 Document Revised: 05/05/2011 Document Reviewed: 12/26/2006 [facility] Patient Information 2014 [employer], [place].   Leave splint intact and keep dry. For other wounds, Wash daily in shower with soap and water. Do not soak. Apply antibiotic ointment (e.g. Neosporin) twice daily and as needed to keep moist. Cover with dry dressing.  No driving while taking oxycodone or morphine.
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
## 2013-03-14 NOTE — Progress Notes (Signed)
Agree with above 
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
## 2019-05-04 ENCOUNTER — Inpatient Hospital Stay (HOSPITAL_COMMUNITY): Payer: Medicaid Other | Admitting: Certified Registered Nurse Anesthetist

## 2019-05-04 ENCOUNTER — Emergency Department (HOSPITAL_COMMUNITY): Payer: Medicaid Other

## 2019-05-04 ENCOUNTER — Encounter (HOSPITAL_COMMUNITY): Admission: EM | Disposition: A | Discharge status: Rehabilitation Facility | Payer: Self-pay | Source: Home / Self Care

## 2019-05-04 ENCOUNTER — Inpatient Hospital Stay (HOSPITAL_COMMUNITY)
Admission: EM | Admit: 2019-05-04 | Discharge: 2019-05-11 | DRG: 957 | Disposition: A | Discharge status: Rehabilitation Facility | Payer: Medicaid Other | Attending: Surgery | Admitting: Surgery

## 2019-05-04 ENCOUNTER — Encounter (HOSPITAL_COMMUNITY): Payer: Self-pay | Admitting: *Deleted

## 2019-05-04 ENCOUNTER — Inpatient Hospital Stay (HOSPITAL_COMMUNITY): Payer: Medicaid Other

## 2019-05-04 ENCOUNTER — Other Ambulatory Visit: Payer: Self-pay

## 2019-05-04 DIAGNOSIS — K592 Neurogenic bowel, not elsewhere classified: Secondary | ICD-10-CM | POA: Diagnosis not present

## 2019-05-04 DIAGNOSIS — K5900 Constipation, unspecified: Secondary | ICD-10-CM | POA: Diagnosis not present

## 2019-05-04 DIAGNOSIS — M4184 Other forms of scoliosis, thoracic region: Secondary | ICD-10-CM | POA: Diagnosis present

## 2019-05-04 DIAGNOSIS — S12000A Unspecified displaced fracture of first cervical vertebra, initial encounter for closed fracture: Secondary | ICD-10-CM | POA: Diagnosis present

## 2019-05-04 DIAGNOSIS — Y92410 Unspecified street and highway as the place of occurrence of the external cause: Secondary | ICD-10-CM | POA: Diagnosis not present

## 2019-05-04 DIAGNOSIS — S22039D Unspecified fracture of third thoracic vertebra, subsequent encounter for fracture with routine healing: Secondary | ICD-10-CM | POA: Diagnosis not present

## 2019-05-04 DIAGNOSIS — S0231XD Fracture of orbital floor, right side, subsequent encounter for fracture with routine healing: Secondary | ICD-10-CM | POA: Diagnosis not present

## 2019-05-04 DIAGNOSIS — S14159A Other incomplete lesion at unspecified level of cervical spinal cord, initial encounter: Secondary | ICD-10-CM | POA: Diagnosis present

## 2019-05-04 DIAGNOSIS — G8220 Paraplegia, unspecified: Secondary | ICD-10-CM | POA: Diagnosis present

## 2019-05-04 DIAGNOSIS — S0231XA Fracture of orbital floor, right side, initial encounter for closed fracture: Secondary | ICD-10-CM | POA: Diagnosis present

## 2019-05-04 DIAGNOSIS — S12600A Unspecified displaced fracture of seventh cervical vertebra, initial encounter for closed fracture: Secondary | ICD-10-CM | POA: Diagnosis present

## 2019-05-04 DIAGNOSIS — D62 Acute posthemorrhagic anemia: Secondary | ICD-10-CM | POA: Diagnosis present

## 2019-05-04 DIAGNOSIS — J9690 Respiratory failure, unspecified, unspecified whether with hypoxia or hypercapnia: Secondary | ICD-10-CM

## 2019-05-04 DIAGNOSIS — N179 Acute kidney failure, unspecified: Secondary | ICD-10-CM | POA: Diagnosis present

## 2019-05-04 DIAGNOSIS — Z20822 Contact with and (suspected) exposure to covid-19: Secondary | ICD-10-CM | POA: Diagnosis present

## 2019-05-04 DIAGNOSIS — F10129 Alcohol abuse with intoxication, unspecified: Secondary | ICD-10-CM | POA: Diagnosis present

## 2019-05-04 DIAGNOSIS — S22049A Unspecified fracture of fourth thoracic vertebra, initial encounter for closed fracture: Secondary | ICD-10-CM | POA: Diagnosis present

## 2019-05-04 DIAGNOSIS — N4830 Priapism, unspecified: Secondary | ICD-10-CM | POA: Diagnosis present

## 2019-05-04 DIAGNOSIS — S27892A Contusion of other specified intrathoracic organs, initial encounter: Secondary | ICD-10-CM | POA: Diagnosis present

## 2019-05-04 DIAGNOSIS — W3400XS Accidental discharge from unspecified firearms or gun, sequela: Secondary | ICD-10-CM

## 2019-05-04 DIAGNOSIS — M549 Dorsalgia, unspecified: Secondary | ICD-10-CM | POA: Diagnosis not present

## 2019-05-04 DIAGNOSIS — Z4789 Encounter for other orthopedic aftercare: Secondary | ICD-10-CM | POA: Diagnosis not present

## 2019-05-04 DIAGNOSIS — S02113A Unspecified occipital condyle fracture, initial encounter for closed fracture: Secondary | ICD-10-CM | POA: Diagnosis present

## 2019-05-04 DIAGNOSIS — S24102A Unspecified injury at T2-T6 level of thoracic spinal cord, initial encounter: Secondary | ICD-10-CM | POA: Diagnosis present

## 2019-05-04 DIAGNOSIS — S22039A Unspecified fracture of third thoracic vertebra, initial encounter for closed fracture: Secondary | ICD-10-CM | POA: Diagnosis present

## 2019-05-04 DIAGNOSIS — N390 Urinary tract infection, site not specified: Secondary | ICD-10-CM | POA: Diagnosis not present

## 2019-05-04 DIAGNOSIS — S2232XA Fracture of one rib, left side, initial encounter for closed fracture: Secondary | ICD-10-CM | POA: Diagnosis present

## 2019-05-04 DIAGNOSIS — Z9911 Dependence on respirator [ventilator] status: Secondary | ICD-10-CM

## 2019-05-04 DIAGNOSIS — Y906 Blood alcohol level of 120-199 mg/100 ml: Secondary | ICD-10-CM | POA: Diagnosis present

## 2019-05-04 DIAGNOSIS — T1490XA Injury, unspecified, initial encounter: Secondary | ICD-10-CM

## 2019-05-04 DIAGNOSIS — F17200 Nicotine dependence, unspecified, uncomplicated: Secondary | ICD-10-CM | POA: Diagnosis present

## 2019-05-04 DIAGNOSIS — R339 Retention of urine, unspecified: Secondary | ICD-10-CM | POA: Diagnosis present

## 2019-05-04 DIAGNOSIS — S24109A Unspecified injury at unspecified level of thoracic spinal cord, initial encounter: Secondary | ICD-10-CM

## 2019-05-04 DIAGNOSIS — Z419 Encounter for procedure for purposes other than remedying health state, unspecified: Secondary | ICD-10-CM

## 2019-05-04 DIAGNOSIS — J9601 Acute respiratory failure with hypoxia: Secondary | ICD-10-CM | POA: Diagnosis not present

## 2019-05-04 DIAGNOSIS — F1290 Cannabis use, unspecified, uncomplicated: Secondary | ICD-10-CM | POA: Diagnosis present

## 2019-05-04 DIAGNOSIS — S12000D Unspecified displaced fracture of first cervical vertebra, subsequent encounter for fracture with routine healing: Secondary | ICD-10-CM | POA: Diagnosis not present

## 2019-05-04 DIAGNOSIS — S2231XA Fracture of one rib, right side, initial encounter for closed fracture: Secondary | ICD-10-CM | POA: Diagnosis present

## 2019-05-04 DIAGNOSIS — S22009A Unspecified fracture of unspecified thoracic vertebra, initial encounter for closed fracture: Secondary | ICD-10-CM | POA: Diagnosis present

## 2019-05-04 DIAGNOSIS — G8221 Paraplegia, complete: Secondary | ICD-10-CM | POA: Diagnosis not present

## 2019-05-04 DIAGNOSIS — R05 Cough: Secondary | ICD-10-CM | POA: Diagnosis not present

## 2019-05-04 DIAGNOSIS — S22049D Unspecified fracture of fourth thoracic vertebra, subsequent encounter for fracture with routine healing: Secondary | ICD-10-CM | POA: Diagnosis not present

## 2019-05-04 DIAGNOSIS — E872 Acidosis: Secondary | ICD-10-CM | POA: Diagnosis present

## 2019-05-04 DIAGNOSIS — G4700 Insomnia, unspecified: Secondary | ICD-10-CM | POA: Diagnosis not present

## 2019-05-04 DIAGNOSIS — Z978 Presence of other specified devices: Secondary | ICD-10-CM

## 2019-05-04 DIAGNOSIS — R509 Fever, unspecified: Secondary | ICD-10-CM

## 2019-05-04 HISTORY — PX: POSTERIOR LUMBAR FUSION 4 LEVEL: SHX6037

## 2019-05-04 HISTORY — DX: Contusion of lung, unspecified, initial encounter: S27.329A

## 2019-05-04 LAB — COMPREHENSIVE METABOLIC PANEL
ALT: 21 U/L (ref 0–44)
AST: 29 U/L (ref 15–41)
Albumin: 3.6 g/dL (ref 3.5–5.0)
Alkaline Phosphatase: 30 U/L — ABNORMAL LOW (ref 38–126)
Anion gap: 12 (ref 5–15)
BUN: 9 mg/dL (ref 6–20)
CO2: 19 mmol/L — ABNORMAL LOW (ref 22–32)
Calcium: 8.6 mg/dL — ABNORMAL LOW (ref 8.9–10.3)
Chloride: 110 mmol/L (ref 98–111)
Creatinine, Ser: 1.35 mg/dL — ABNORMAL HIGH (ref 0.61–1.24)
GFR calc Af Amer: >60 mL/min (ref >60)
GFR calc non Af Amer: >60 mL/min (ref >60)
Glucose, Bld: 109 mg/dL — ABNORMAL HIGH (ref 70–99)
Potassium: 3.4 mmol/L — ABNORMAL LOW (ref 3.5–5.1)
Sodium: 141 mmol/L (ref 135–145)
Total Bilirubin: 0.6 mg/dL (ref 0.3–1.2)
Total Protein: 6.4 g/dL — ABNORMAL LOW (ref 6.5–8.1)

## 2019-05-04 LAB — RAPID URINE DRUG SCREEN, HOSP PERFORMED
Amphetamines: NOT DETECTED
Barbiturates: NOT DETECTED
Benzodiazepines: NOT DETECTED
Cocaine: NOT DETECTED
Opiates: NOT DETECTED
Tetrahydrocannabinol: POSITIVE — AB

## 2019-05-04 LAB — URINALYSIS, ROUTINE W REFLEX MICROSCOPIC
Bilirubin Urine: NEGATIVE
Glucose, UA: NEGATIVE mg/dL
Ketones, ur: NEGATIVE mg/dL
Leukocytes,Ua: NEGATIVE
Nitrite: NEGATIVE
Protein, ur: 100 mg/dL — AB
Specific Gravity, Urine: 1.015 (ref 1.005–1.030)
pH: 7 (ref 5.0–8.0)

## 2019-05-04 LAB — PROTIME-INR
INR: 1.2 (ref 0.8–1.2)
Prothrombin Time: 15.2 seconds (ref 11.4–15.2)

## 2019-05-04 LAB — CBC
HCT: 41.3 % (ref 39.0–52.0)
Hemoglobin: 14.2 g/dL (ref 13.0–17.0)
MCH: 32.2 pg (ref 26.0–34.0)
MCHC: 34.4 g/dL (ref 30.0–36.0)
MCV: 93.7 fL (ref 80.0–100.0)
Platelets: 211 10*3/uL (ref 150–400)
RBC: 4.41 MIL/uL (ref 4.22–5.81)
RDW: 12.7 % (ref 11.5–15.5)
WBC: 10.4 10*3/uL (ref 4.0–10.5)
nRBC: 0 % (ref 0.0–0.2)

## 2019-05-04 LAB — SAMPLE TO BLOOD BANK

## 2019-05-04 LAB — I-STAT CHEM 8, ED
BUN: 10 mg/dL (ref 6–20)
Calcium, Ion: 1.1 mmol/L — ABNORMAL LOW (ref 1.15–1.40)
Chloride: 110 mmol/L (ref 98–111)
Creatinine, Ser: 1.6 mg/dL — ABNORMAL HIGH (ref 0.61–1.24)
Glucose, Bld: 103 mg/dL — ABNORMAL HIGH (ref 70–99)
HCT: 42 % (ref 39.0–52.0)
Hemoglobin: 14.3 g/dL (ref 13.0–17.0)
Potassium: 3.4 mmol/L — ABNORMAL LOW (ref 3.5–5.1)
Sodium: 142 mmol/L (ref 135–145)
TCO2: 22 mmol/L (ref 22–32)

## 2019-05-04 LAB — RESPIRATORY PANEL BY RT PCR (FLU A&B, COVID)
Influenza A by PCR: NEGATIVE
Influenza B by PCR: NEGATIVE
SARS Coronavirus 2 by RT PCR: NEGATIVE

## 2019-05-04 LAB — ABO/RH: ABO/RH(D): AB POS

## 2019-05-04 LAB — LACTIC ACID, PLASMA: Lactic Acid, Venous: 3.2 mmol/L (ref 0.5–1.9)

## 2019-05-04 LAB — ETHANOL: Alcohol, Ethyl (B): 137 mg/dL — ABNORMAL HIGH (ref <10)

## 2019-05-04 SURGERY — POSTERIOR LUMBAR FUSION 4 LEVEL
Anesthesia: General | Site: Back

## 2019-05-04 MED ORDER — PHENYLEPHRINE 40 MCG/ML (10ML) SYRINGE FOR IV PUSH (FOR BLOOD PRESSURE SUPPORT)
PREFILLED_SYRINGE | INTRAVENOUS | Status: AC
  Filled 2019-05-04: qty 10

## 2019-05-04 MED ORDER — PROPOFOL 10 MG/ML IV BOLUS
INTRAVENOUS | Status: AC
  Filled 2019-05-04: qty 20

## 2019-05-04 MED ORDER — ROCURONIUM BROMIDE 10 MG/ML (PF) SYRINGE
PREFILLED_SYRINGE | INTRAVENOUS | Status: DC | PRN
  Administered 2019-05-04 – 2019-05-05 (×4): 50 mg via INTRAVENOUS

## 2019-05-04 MED ORDER — PHENYLEPHRINE HCL-NACL 10-0.9 MG/250ML-% IV SOLN
INTRAVENOUS | Status: DC | PRN
  Administered 2019-05-04: 25 ug/min via INTRAVENOUS

## 2019-05-04 MED ORDER — FENTANYL CITRATE (PF) 100 MCG/2ML IJ SOLN
INTRAMUSCULAR | Status: AC | PRN
  Administered 2019-05-04: 50 ug via INTRAVENOUS

## 2019-05-04 MED ORDER — SODIUM CHLORIDE 0.9 % IV SOLN
10.0000 mL/h | Freq: Once | INTRAVENOUS | Status: AC
  Administered 2019-05-05: 10 mL/h via INTRAVENOUS

## 2019-05-04 MED ORDER — LACTATED RINGERS IV SOLN: INTRAVENOUS | Status: DC | PRN

## 2019-05-04 MED ORDER — FENTANYL CITRATE (PF) 100 MCG/2ML IJ SOLN
50.0000 ug | Freq: Once | INTRAMUSCULAR | Status: AC
  Administered 2019-05-04: 50 ug via INTRAVENOUS

## 2019-05-04 MED ORDER — CEFAZOLIN SODIUM-DEXTROSE 2-3 GM-%(50ML) IV SOLR
INTRAVENOUS | Status: DC | PRN
  Administered 2019-05-04 – 2019-05-05 (×2): 2 g via INTRAVENOUS

## 2019-05-04 MED ORDER — LIDOCAINE-EPINEPHRINE 1 %-1:100000 IJ SOLN
INTRAMUSCULAR | Status: DC | PRN
  Administered 2019-05-04: 5 mL via INTRADERMAL

## 2019-05-04 MED ORDER — MIDAZOLAM HCL 2 MG/2ML IJ SOLN
INTRAMUSCULAR | Status: DC | PRN
  Administered 2019-05-04: 2 mg via INTRAVENOUS

## 2019-05-04 MED ORDER — LIDOCAINE 2% (20 MG/ML) 5 ML SYRINGE
INTRAMUSCULAR | Status: DC | PRN
  Administered 2019-05-04: 60 mg via INTRAVENOUS

## 2019-05-04 MED ORDER — BUPIVACAINE HCL (PF) 0.5 % IJ SOLN
INTRAMUSCULAR | Status: DC | PRN
  Administered 2019-05-04: 5 mL

## 2019-05-04 MED ORDER — 0.9 % SODIUM CHLORIDE (POUR BTL) OPTIME
TOPICAL | Status: DC | PRN
  Administered 2019-05-04: 1000 mL

## 2019-05-04 MED ORDER — BUPIVACAINE HCL (PF) 0.5 % IJ SOLN
INTRAMUSCULAR | Status: AC
  Filled 2019-05-04: qty 30

## 2019-05-04 MED ORDER — PHENYLEPHRINE HCL (PRESSORS) 10 MG/ML IV SOLN
INTRAVENOUS | Status: DC | PRN
  Administered 2019-05-04 (×7): 80 ug via INTRAVENOUS

## 2019-05-04 MED ORDER — ETOMIDATE 2 MG/ML IV SOLN
INTRAVENOUS | Status: DC | PRN
  Administered 2019-05-04: 14 mg via INTRAVENOUS

## 2019-05-04 MED ORDER — SODIUM CHLORIDE 0.9 % IV BOLUS: 1000.0000 mL | Freq: Once | INTRAVENOUS | Status: DC

## 2019-05-04 MED ORDER — DEXAMETHASONE SODIUM PHOSPHATE 10 MG/ML IJ SOLN
INTRAMUSCULAR | Status: DC | PRN
  Administered 2019-05-04: 10 mg via INTRAVENOUS

## 2019-05-04 MED ORDER — LIDOCAINE-EPINEPHRINE 1 %-1:100000 IJ SOLN
INTRAMUSCULAR | Status: AC
  Filled 2019-05-04: qty 1

## 2019-05-04 MED ORDER — CEFAZOLIN SODIUM-DEXTROSE 2-4 GM/100ML-% IV SOLN
INTRAVENOUS | Status: AC
  Administered 2019-05-05: 2 g via INTRAVENOUS
  Filled 2019-05-04: qty 100

## 2019-05-04 MED ORDER — SODIUM CHLORIDE 0.9 % IV SOLN
INTRAVENOUS | Status: AC | PRN
  Administered 2019-05-04: 1000 mL via INTRAVENOUS

## 2019-05-04 MED ORDER — MIDAZOLAM HCL 2 MG/2ML IJ SOLN
INTRAMUSCULAR | Status: AC
  Filled 2019-05-04: qty 2

## 2019-05-04 MED ORDER — LACTATED RINGERS IV SOLN: INTRAVENOUS | Status: DC

## 2019-05-04 MED ORDER — SODIUM BICARBONATE 8.4 % IV SOLN
INTRAVENOUS | Status: AC
  Filled 2019-05-04: qty 150

## 2019-05-04 MED ORDER — ALBUMIN HUMAN 5 % IV SOLN: INTRAVENOUS | Status: DC | PRN

## 2019-05-04 MED ORDER — IOHEXOL 300 MG/ML  SOLN
100.0000 mL | Freq: Once | INTRAMUSCULAR | Status: AC | PRN
  Administered 2019-05-04: 100 mL via INTRAVENOUS

## 2019-05-04 MED ORDER — PROPOFOL 10 MG/ML IV BOLUS
INTRAVENOUS | Status: DC | PRN
  Administered 2019-05-04: 50 mg via INTRAVENOUS

## 2019-05-04 MED ORDER — SUCCINYLCHOLINE CHLORIDE 200 MG/10ML IV SOSY
PREFILLED_SYRINGE | INTRAVENOUS | Status: DC | PRN
  Administered 2019-05-04: 120 mg via INTRAVENOUS

## 2019-05-04 MED ORDER — FENTANYL CITRATE (PF) 100 MCG/2ML IJ SOLN
INTRAMUSCULAR | Status: AC
  Filled 2019-05-04: qty 2

## 2019-05-04 MED ORDER — THROMBIN 5000 UNITS EX SOLR
CUTANEOUS | Status: AC
  Filled 2019-05-04: qty 5000

## 2019-05-04 MED ORDER — THROMBIN 5000 UNITS EX SOLR
OROMUCOSAL | Status: DC | PRN
  Administered 2019-05-04 (×2): 5 mL via TOPICAL

## 2019-05-04 MED ORDER — SODIUM BICARBONATE 8.4 % IV SOLN
INTRAVENOUS | Status: DC | PRN
  Administered 2019-05-04 – 2019-05-05 (×3): 50 meq via INTRAVENOUS

## 2019-05-04 MED ORDER — FENTANYL CITRATE (PF) 250 MCG/5ML IJ SOLN
INTRAMUSCULAR | Status: AC
  Filled 2019-05-04: qty 5

## 2019-05-04 MED ORDER — FENTANYL CITRATE (PF) 250 MCG/5ML IJ SOLN
INTRAMUSCULAR | Status: DC | PRN
  Administered 2019-05-04 – 2019-05-05 (×6): 50 ug via INTRAVENOUS

## 2019-05-04 MED ORDER — PROPOFOL 10 MG/ML IV BOLUS
INTRAVENOUS | Status: AC
  Filled 2019-05-04: qty 40

## 2019-05-04 MED ORDER — CEFAZOLIN SODIUM-DEXTROSE 2-4 GM/100ML-% IV SOLN
2.0000 g | Freq: Once | INTRAVENOUS | Status: AC
  Filled 2019-05-04: qty 100

## 2019-05-04 MED ORDER — SODIUM CHLORIDE 0.9 % IV SOLN
INTRAVENOUS | Status: DC | PRN
  Administered 2019-05-04: 500 mL

## 2019-05-04 MED ORDER — EPHEDRINE SULFATE 50 MG/ML IJ SOLN
INTRAMUSCULAR | Status: DC | PRN
  Administered 2019-05-04: 10 mg via INTRAVENOUS

## 2019-05-04 SURGICAL SUPPLY — 77 items
BAG DECANTER FOR FLEXI CONT (MISCELLANEOUS) ×3 IMPLANT
BASKET BONE COLLECTION (BASKET) ×3 IMPLANT
BATTERY IQ STERILE (MISCELLANEOUS) IMPLANT
BENZOIN TINCTURE PRP APPL 2/3 (GAUZE/BANDAGES/DRESSINGS) IMPLANT
BIT DRILL LONG 3.0X30 (BIT) ×1 IMPLANT
BIT DRILL LONG 3.0X30MM (BIT) ×1
BLADE CLIPPER SURG (BLADE) IMPLANT
BLADE SURG 11 STRL SS (BLADE) ×3 IMPLANT
BUR MATCHSTICK NEURO 3.0 LAGG (BURR) ×3 IMPLANT
BUR PRECISION FLUTE 5.0 (BURR) ×3 IMPLANT
CANISTER SUCT 3000ML PPV (MISCELLANEOUS) ×3 IMPLANT
CLOSURE WOUND 1/2 X4 (GAUZE/BANDAGES/DRESSINGS)
CNTNR URN SCR LID CUP LEK RST (MISCELLANEOUS) ×1 IMPLANT
CONT SPEC 4OZ STRL OR WHT (MISCELLANEOUS) ×2
COVER BACK TABLE 60X90IN (DRAPES) ×3 IMPLANT
COVER WAND RF STERILE (DRAPES) ×3 IMPLANT
DECANTER SPIKE VIAL GLASS SM (MISCELLANEOUS) ×3 IMPLANT
DERMABOND ADVANCED (GAUZE/BANDAGES/DRESSINGS) ×2
DERMABOND ADVANCED .7 DNX12 (GAUZE/BANDAGES/DRESSINGS) ×1 IMPLANT
DRAIN SNY WOU 7FLT (WOUND CARE) ×2 IMPLANT
DRAPE C-ARM 42X72 X-RAY (DRAPES) ×6 IMPLANT
DRAPE C-ARMOR (DRAPES) ×2 IMPLANT
DRAPE LAPAROTOMY 100X72X124 (DRAPES) ×3 IMPLANT
DRAPE SURG 17X23 STRL (DRAPES) ×3 IMPLANT
DRSG OPSITE 4X5.5 SM (GAUZE/BANDAGES/DRESSINGS) ×2 IMPLANT
DRSG OPSITE POSTOP 4X10 (GAUZE/BANDAGES/DRESSINGS) ×2 IMPLANT
DURAPREP 26ML APPLICATOR (WOUND CARE) ×3 IMPLANT
ELECT REM PT RETURN 9FT ADLT (ELECTROSURGICAL) ×3
ELECTRODE REM PT RTRN 9FT ADLT (ELECTROSURGICAL) ×1 IMPLANT
EVACUATOR SILICONE 100CC (DRAIN) ×2 IMPLANT
GAUZE 4X4 16PLY RFD (DISPOSABLE) IMPLANT
GAUZE SPONGE 4X4 12PLY STRL (GAUZE/BANDAGES/DRESSINGS) IMPLANT
GAUZE SPONGE 4X4 16PLY XRAY LF (GAUZE/BANDAGES/DRESSINGS) ×2 IMPLANT
GLOVE BIO SURGEON STRL SZ7 (GLOVE) ×4 IMPLANT
GLOVE BIO SURGEON STRL SZ7.5 (GLOVE) ×8 IMPLANT
GLOVE BIOGEL PI IND STRL 7.5 (GLOVE) ×2 IMPLANT
GLOVE BIOGEL PI INDICATOR 7.5 (GLOVE) ×6
GLOVE EXAM NITRILE LRG STRL (GLOVE) IMPLANT
GLOVE EXAM NITRILE XL STR (GLOVE) IMPLANT
GLOVE EXAM NITRILE XS STR PU (GLOVE) IMPLANT
GOWN STRL REUS W/ TWL LRG LVL3 (GOWN DISPOSABLE) ×4 IMPLANT
GOWN STRL REUS W/ TWL XL LVL3 (GOWN DISPOSABLE) IMPLANT
GOWN STRL REUS W/TWL 2XL LVL3 (GOWN DISPOSABLE) IMPLANT
GOWN STRL REUS W/TWL LRG LVL3 (GOWN DISPOSABLE) ×8
GOWN STRL REUS W/TWL XL LVL3 (GOWN DISPOSABLE)
HEMOSTAT POWDER KIT SURGIFOAM (HEMOSTASIS) ×3 IMPLANT
KIT BASIN OR (CUSTOM PROCEDURE TRAY) ×3 IMPLANT
KIT POSITION SURG JACKSON T1 (MISCELLANEOUS) ×3 IMPLANT
KIT SPINE MAZOR X ROBO DISP (MISCELLANEOUS) ×2 IMPLANT
KIT TURNOVER KIT B (KITS) ×3 IMPLANT
MILL MEDIUM DISP (BLADE) ×5 IMPLANT
NDL HYPO 18GX1.5 BLUNT FILL (NEEDLE) IMPLANT
NDL SPNL 18GX3.5 QUINCKE PK (NEEDLE) IMPLANT
NEEDLE HYPO 18GX1.5 BLUNT FILL (NEEDLE) IMPLANT
NEEDLE HYPO 22GX1.5 SAFETY (NEEDLE) ×3 IMPLANT
NEEDLE SPNL 18GX3.5 QUINCKE PK (NEEDLE) IMPLANT
NS IRRIG 1000ML POUR BTL (IV SOLUTION) ×3 IMPLANT
PACK LAMINECTOMY NEURO (CUSTOM PROCEDURE TRAY) ×3 IMPLANT
PAD ARMBOARD 7.5X6 YLW CONV (MISCELLANEOUS) ×9 IMPLANT
ROD 5.5MM SPINAL SOLERA (Rod) ×2 IMPLANT
SCREW SET SOLERA (Screw) ×18 IMPLANT
SCREW SET SOLERA TI5.5 (Screw) IMPLANT
SCREW SOLERA 4.5X30 (Screw) ×12 IMPLANT
SCREW SPINAL 4.5X30MM COBALT (Screw) ×6 IMPLANT
SPONGE LAP 4X18 RFD (DISPOSABLE) IMPLANT
SPONGE SURGIFOAM ABS GEL 100 (HEMOSTASIS) IMPLANT
STRIP CLOSURE SKIN 1/2X4 (GAUZE/BANDAGES/DRESSINGS) IMPLANT
SUT MNCRL AB 3-0 PS2 18 (SUTURE) ×3 IMPLANT
SUT VIC AB 0 CT1 18XCR BRD8 (SUTURE) ×1 IMPLANT
SUT VIC AB 0 CT1 8-18 (SUTURE) ×4
SUT VIC AB 2-0 CP2 18 (SUTURE) ×5 IMPLANT
SYR 3ML LL SCALE MARK (SYRINGE) IMPLANT
TOWEL GREEN STERILE (TOWEL DISPOSABLE) ×3 IMPLANT
TOWEL GREEN STERILE FF (TOWEL DISPOSABLE) ×3 IMPLANT
TRAY FOLEY MTR SLVR 16FR STAT (SET/KITS/TRAYS/PACK) ×3 IMPLANT
TUBE MAZOR SA REDUCTION (TUBING) ×2 IMPLANT
WATER STERILE IRR 1000ML POUR (IV SOLUTION) ×3 IMPLANT

## 2019-05-04 NOTE — Anesthesia Procedure Notes (Signed)
Procedure Name: Intubation Date/Time: 05/04/2019 8:21 PM Performed by: Dairl Ponder, CRNA Pre-anesthesia Checklist: Patient identified, Emergency Drugs available, Suction available and Patient being monitored Patient Re-evaluated:Patient Re-evaluated prior to induction Oxygen Delivery Method: Circle System Utilized Preoxygenation: Pre-oxygenation with 100% oxygen Induction Type: IV induction, Rapid sequence and Cricoid Pressure applied Laryngoscope Size: Glidescope and 4 Grade View: Grade I Tube type: Oral Tube size: 7.5 mm Number of attempts: 1 Airway Equipment and Method: Stylet and Video-laryngoscopy Placement Confirmation: ETT inserted through vocal cords under direct vision,  positive ETCO2 and breath sounds checked- equal and bilateral Secured at: 23 cm Tube secured with: Tape Dental Injury: Teeth and Oropharynx as per pre-operative assessment  Comments: Ostergard MD at bedside for induction for cervical neck stabilization and traction. Patient reported comfort before anesthesia induction. Utilized Glidescope d/t cervical neck instability. Aspen collar reapplied by Maurice Small MD post induction.

## 2019-05-04 NOTE — Progress Notes (Signed)
Patient not available. Phebe Colla, Chaplain
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
## 2019-05-04 NOTE — ED Notes (Signed)
Father Dijuan Sleeth would like an update. [phone]
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
## 2019-05-04 NOTE — Progress Notes (Signed)
Patient not available.   05/04/19 1800  Clinical Encounter Type  Visited With Patient not available;Health care provider  Visit Type Trauma  Referral From Care management  Consult/Referral To Chaplain

## 2019-05-04 NOTE — Anesthesia Procedure Notes (Signed)
Arterial Line Insertion Start/End3/11/2019 7:50 PM, 05/04/2019 7:55 PM Performed by: Zollie Scale, CRNA, CRNA  Patient location: Pre-op. Preanesthetic checklist: patient identified, IV checked, site marked, risks and benefits discussed, surgical consent, monitors and equipment checked, pre-op evaluation, timeout performed and anesthesia consent Lidocaine 1% used for infiltration Left was placed Catheter size: 20 G Hand hygiene performed , maximum sterile barriers used  and Seldinger technique used Allen's test indicative of satisfactory collateral circulation Attempts: 1 Procedure performed using ultrasound guided technique. Following insertion, dressing applied and Biopatch. Post procedure assessment: normal  Patient tolerated the procedure well with no immediate complications.
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
## 2019-05-05 ENCOUNTER — Encounter: Payer: Self-pay | Admitting: *Deleted

## 2019-05-05 ENCOUNTER — Inpatient Hospital Stay (HOSPITAL_COMMUNITY): Payer: Medicaid Other

## 2019-05-05 LAB — BPAM RBC
Blood Product Expiration Date: 202104032359
Blood Product Expiration Date: 202104052359
ISSUE DATE / TIME: 202103102009
ISSUE DATE / TIME: 202103102009
Unit Type and Rh: 8400
Unit Type and Rh: 8400

## 2019-05-05 LAB — BASIC METABOLIC PANEL
Anion gap: 9 (ref 5–15)
BUN: 8 mg/dL (ref 6–20)
CO2: 24 mmol/L (ref 22–32)
Calcium: 7.7 mg/dL — ABNORMAL LOW (ref 8.9–10.3)
Chloride: 110 mmol/L (ref 98–111)
Creatinine, Ser: 1.07 mg/dL (ref 0.61–1.24)
GFR calc Af Amer: >60 mL/min (ref >60)
GFR calc non Af Amer: >60 mL/min (ref >60)
Glucose, Bld: 128 mg/dL — ABNORMAL HIGH (ref 70–99)
Potassium: 4.2 mmol/L (ref 3.5–5.1)
Sodium: 143 mmol/L (ref 135–145)

## 2019-05-05 LAB — PREPARE RBC (CROSSMATCH)

## 2019-05-05 LAB — TYPE AND SCREEN
ABO/RH(D): AB POS
Antibody Screen: NEGATIVE
Unit division: 0
Unit division: 0

## 2019-05-05 LAB — MAGNESIUM: Magnesium: 1.4 mg/dL — ABNORMAL LOW (ref 1.7–2.4)

## 2019-05-05 LAB — COMPREHENSIVE METABOLIC PANEL
ALT: 20 U/L (ref 0–44)
AST: 44 U/L — ABNORMAL HIGH (ref 15–41)
Albumin: 3.5 g/dL (ref 3.5–5.0)
Alkaline Phosphatase: 19 U/L — ABNORMAL LOW (ref 38–126)
Anion gap: 14 (ref 5–15)
BUN: 6 mg/dL (ref 6–20)
CO2: 18 mmol/L — ABNORMAL LOW (ref 22–32)
Calcium: 7.9 mg/dL — ABNORMAL LOW (ref 8.9–10.3)
Chloride: 110 mmol/L (ref 98–111)
Creatinine, Ser: 0.93 mg/dL (ref 0.61–1.24)
GFR calc Af Amer: >60 mL/min (ref >60)
GFR calc non Af Amer: >60 mL/min (ref >60)
Glucose, Bld: 124 mg/dL — ABNORMAL HIGH (ref 70–99)
Potassium: 3.8 mmol/L (ref 3.5–5.1)
Sodium: 142 mmol/L (ref 135–145)
Total Bilirubin: 1.1 mg/dL (ref 0.3–1.2)
Total Protein: 5.1 g/dL — ABNORMAL LOW (ref 6.5–8.1)

## 2019-05-05 LAB — POCT I-STAT 7, (LYTES, BLD GAS, ICA,H+H)
Acid-base deficit: 4 mmol/L — ABNORMAL HIGH (ref 0.0–2.0)
Acid-base deficit: 5 mmol/L — ABNORMAL HIGH (ref 0.0–2.0)
Acid-base deficit: 6 mmol/L — ABNORMAL HIGH (ref 0.0–2.0)
Acid-base deficit: 6 mmol/L — ABNORMAL HIGH (ref 0.0–2.0)
Bicarbonate: 20.1 mmol/L (ref 20.0–28.0)
Bicarbonate: 19.5 mmol/L — ABNORMAL LOW (ref 20.0–28.0)
Bicarbonate: 20.1 mmol/L (ref 20.0–28.0)
Bicarbonate: 21.5 mmol/L (ref 20.0–28.0)
Calcium, Ion: 1.08 mmol/L — ABNORMAL LOW (ref 1.15–1.40)
Calcium, Ion: 1.08 mmol/L — ABNORMAL LOW (ref 1.15–1.40)
Calcium, Ion: 1.11 mmol/L — ABNORMAL LOW (ref 1.15–1.40)
Calcium, Ion: 1.17 mmol/L (ref 1.15–1.40)
HCT: 30 % — ABNORMAL LOW (ref 39.0–52.0)
HCT: 31 % — ABNORMAL LOW (ref 39.0–52.0)
HCT: 32 % — ABNORMAL LOW (ref 39.0–52.0)
HCT: 37 % — ABNORMAL LOW (ref 39.0–52.0)
Hemoglobin: 10.2 g/dL — ABNORMAL LOW (ref 13.0–17.0)
Hemoglobin: 10.5 g/dL — ABNORMAL LOW (ref 13.0–17.0)
Hemoglobin: 10.9 g/dL — ABNORMAL LOW (ref 13.0–17.0)
Hemoglobin: 12.6 g/dL — ABNORMAL LOW (ref 13.0–17.0)
O2 Saturation: 100 %
O2 Saturation: 100 %
O2 Saturation: 100 %
O2 Saturation: 100 %
Patient temperature: 33.5
Patient temperature: 33.6
Patient temperature: 33.6
Patient temperature: 34.4
Potassium: 3.6 mmol/L (ref 3.5–5.1)
Potassium: 3.8 mmol/L (ref 3.5–5.1)
Potassium: 3.9 mmol/L (ref 3.5–5.1)
Potassium: 4 mmol/L (ref 3.5–5.1)
Sodium: 144 mmol/L (ref 135–145)
Sodium: 141 mmol/L (ref 135–145)
Sodium: 142 mmol/L (ref 135–145)
Sodium: 143 mmol/L (ref 135–145)
TCO2: 21 mmol/L — ABNORMAL LOW (ref 22–32)
TCO2: 21 mmol/L — ABNORMAL LOW (ref 22–32)
TCO2: 21 mmol/L — ABNORMAL LOW (ref 22–32)
TCO2: 23 mmol/L (ref 22–32)
pCO2 arterial: 28.3 mmHg — ABNORMAL LOW (ref 32.0–48.0)
pCO2 arterial: 32 mmHg (ref 32.0–48.0)
pCO2 arterial: 32.7 mmHg (ref 32.0–48.0)
pCO2 arterial: 43.9 mmHg (ref 32.0–48.0)
pH, Arterial: 7.444 (ref 7.350–7.450)
pH, Arterial: 7.284 — ABNORMAL LOW (ref 7.350–7.450)
pH, Arterial: 7.377 (ref 7.350–7.450)
pH, Arterial: 7.38 (ref 7.350–7.450)
pO2, Arterial: 302 mmHg — ABNORMAL HIGH (ref 83.0–108.0)
pO2, Arterial: 283 mmHg — ABNORMAL HIGH (ref 83.0–108.0)
pO2, Arterial: 294 mmHg — ABNORMAL HIGH (ref 83.0–108.0)
pO2, Arterial: 451 mmHg — ABNORMAL HIGH (ref 83.0–108.0)

## 2019-05-05 LAB — MRSA PCR SCREENING: MRSA by PCR: POSITIVE — AB

## 2019-05-05 LAB — PROTIME-INR
INR: 1.5 — ABNORMAL HIGH (ref 0.8–1.2)
INR: 1.4 — ABNORMAL HIGH (ref 0.8–1.2)
Prothrombin Time: 17.8 seconds — ABNORMAL HIGH (ref 11.4–15.2)
Prothrombin Time: 17 seconds — ABNORMAL HIGH (ref 11.4–15.2)

## 2019-05-05 LAB — CBC
HCT: 32.4 % — ABNORMAL LOW (ref 39.0–52.0)
HCT: 31.5 % — ABNORMAL LOW (ref 39.0–52.0)
Hemoglobin: 11.1 g/dL — ABNORMAL LOW (ref 13.0–17.0)
Hemoglobin: 10.8 g/dL — ABNORMAL LOW (ref 13.0–17.0)
MCH: 31.4 pg (ref 26.0–34.0)
MCH: 31.6 pg (ref 26.0–34.0)
MCHC: 34.3 g/dL (ref 30.0–36.0)
MCHC: 34.3 g/dL (ref 30.0–36.0)
MCV: 91.8 fL (ref 80.0–100.0)
MCV: 92.1 fL (ref 80.0–100.0)
Platelets: 145 10*3/uL — ABNORMAL LOW (ref 150–400)
Platelets: 159 10*3/uL (ref 150–400)
RBC: 3.53 MIL/uL — ABNORMAL LOW (ref 4.22–5.81)
RBC: 3.42 MIL/uL — ABNORMAL LOW (ref 4.22–5.81)
RDW: 13.6 % (ref 11.5–15.5)
RDW: 14.1 % (ref 11.5–15.5)
WBC: 10.4 10*3/uL (ref 4.0–10.5)
WBC: 7.7 10*3/uL (ref 4.0–10.5)
nRBC: 0 % (ref 0.0–0.2)
nRBC: 0 % (ref 0.0–0.2)

## 2019-05-05 LAB — POCT I-STAT, CHEM 8
BUN: 7 mg/dL (ref 6–20)
Calcium, Ion: 1.1 mmol/L — ABNORMAL LOW (ref 1.15–1.40)
Chloride: 109 mmol/L (ref 98–111)
Creatinine, Ser: 0.9 mg/dL (ref 0.61–1.24)
Glucose, Bld: 118 mg/dL — ABNORMAL HIGH (ref 70–99)
HCT: 38 % — ABNORMAL LOW (ref 39.0–52.0)
Hemoglobin: 12.9 g/dL — ABNORMAL LOW (ref 13.0–17.0)
Potassium: 4 mmol/L (ref 3.5–5.1)
Sodium: 141 mmol/L (ref 135–145)
TCO2: 21 mmol/L — ABNORMAL LOW (ref 22–32)

## 2019-05-05 LAB — HIV ANTIBODY (ROUTINE TESTING W REFLEX): HIV Screen 4th Generation wRfx: NONREACTIVE

## 2019-05-05 LAB — TRIGLYCERIDES: Triglycerides: 97 mg/dL (ref <150)

## 2019-05-05 LAB — PHOSPHORUS: Phosphorus: 2 mg/dL — ABNORMAL LOW (ref 2.5–4.6)

## 2019-05-05 MED ORDER — SODIUM CHLORIDE 0.9 % IV SOLN: INTRAVENOUS | Status: DC | PRN

## 2019-05-05 MED ORDER — ACETAMINOPHEN 325 MG PO TABS
650.0000 mg | ORAL_TABLET | ORAL | Status: DC | PRN
  Administered 2019-05-08 – 2019-05-09 (×2): 650 mg via ORAL
  Filled 2019-05-05 (×2): qty 2

## 2019-05-05 MED ORDER — PROPOFOL 500 MG/50ML IV EMUL
INTRAVENOUS | Status: DC | PRN
  Administered 2019-05-05: 75 ug/kg/min via INTRAVENOUS

## 2019-05-05 MED ORDER — POTASSIUM CHLORIDE IN NACL 20-0.9 MEQ/L-% IV SOLN
INTRAVENOUS | Status: DC
  Filled 2019-05-05 (×10): qty 1000

## 2019-05-05 MED ORDER — THIAMINE HCL 100 MG/ML IJ SOLN
100.0000 mg | Freq: Every day | INTRAMUSCULAR | Status: DC
  Administered 2019-05-05 – 2019-05-08 (×4): 100 mg via INTRAVENOUS
  Filled 2019-05-05 (×4): qty 2

## 2019-05-05 MED ORDER — PROPOFOL 1000 MG/100ML IV EMUL
5.0000 ug/kg/min | INTRAVENOUS | Status: DC
  Administered 2019-05-05: 30 ug/kg/min via INTRAVENOUS
  Administered 2019-05-05: 74.38 ug/kg/min via INTRAVENOUS
  Administered 2019-05-05 (×2): 35 ug/kg/min via INTRAVENOUS
  Administered 2019-05-05: 30 ug/kg/min via INTRAVENOUS
  Administered 2019-05-06: 20 ug/kg/min via INTRAVENOUS
  Administered 2019-05-06: 30 ug/kg/min via INTRAVENOUS
  Filled 2019-05-05 (×7): qty 100

## 2019-05-05 MED ORDER — BISACODYL 10 MG RE SUPP: 10.0000 mg | Freq: Every day | RECTAL | Status: DC | PRN

## 2019-05-05 MED ORDER — PHENYLEPHRINE HCL-NACL 10-0.9 MG/250ML-% IV SOLN
0.0000 ug/min | INTRAVENOUS | Status: DC
  Administered 2019-05-05 (×2): 20 ug/min via INTRAVENOUS
  Administered 2019-05-05: 26 ug/min via INTRAVENOUS
  Administered 2019-05-05 – 2019-05-06 (×3): 40 ug/min via INTRAVENOUS
  Administered 2019-05-06: 25 ug/min via INTRAVENOUS
  Filled 2019-05-05 (×5): qty 250
  Filled 2019-05-05: qty 500

## 2019-05-05 MED ORDER — ONDANSETRON 4 MG PO TBDP: 4.0000 mg | ORAL_TABLET | Freq: Four times a day (QID) | ORAL | Status: DC | PRN

## 2019-05-05 MED ORDER — DOCUSATE SODIUM 100 MG PO CAPS
100.0000 mg | ORAL_CAPSULE | Freq: Two times a day (BID) | ORAL | Status: DC
  Administered 2019-05-07 – 2019-05-11 (×9): 100 mg via ORAL
  Filled 2019-05-05 (×9): qty 1

## 2019-05-05 MED ORDER — ORAL CARE MOUTH RINSE
15.0000 mL | OROMUCOSAL | Status: DC
  Administered 2019-05-05 – 2019-05-06 (×15): 15 mL via OROMUCOSAL

## 2019-05-05 MED ORDER — FENTANYL 2500MCG IN NS 250ML (10MCG/ML) PREMIX INFUSION
0.0000 ug/h | INTRAVENOUS | Status: DC
  Administered 2019-05-05: 150 ug/h via INTRAVENOUS
  Administered 2019-05-05: 50 ug/h via INTRAVENOUS
  Administered 2019-05-06: 200 ug/h via INTRAVENOUS
  Filled 2019-05-05 (×3): qty 250

## 2019-05-05 MED ORDER — POLYETHYLENE GLYCOL 3350 17 G PO PACK
17.0000 g | PACK | Freq: Every day | ORAL | Status: DC
  Administered 2019-05-07 – 2019-05-08 (×2): 17 g
  Filled 2019-05-05 (×3): qty 1

## 2019-05-05 MED ORDER — PANTOPRAZOLE SODIUM 40 MG IV SOLR
40.0000 mg | Freq: Every day | INTRAVENOUS | Status: DC
  Administered 2019-05-05 – 2019-05-06 (×2): 40 mg via INTRAVENOUS
  Filled 2019-05-05 (×2): qty 40

## 2019-05-05 MED ORDER — CHLORHEXIDINE GLUCONATE CLOTH 2 % EX PADS
6.0000 | MEDICATED_PAD | Freq: Every day | CUTANEOUS | Status: DC
Start: 2019-05-05 — End: 2019-05-11
  Administered 2019-05-05 – 2019-05-09 (×4): 6 via TOPICAL

## 2019-05-05 MED ORDER — CHLORHEXIDINE GLUCONATE CLOTH 2 % EX PADS: 6.0000 | MEDICATED_PAD | Freq: Every day | CUTANEOUS | Status: DC

## 2019-05-05 MED ORDER — OXYCODONE HCL 5 MG PO TABS
5.0000 mg | ORAL_TABLET | ORAL | Status: DC | PRN
  Administered 2019-05-07: 10 mg via ORAL
  Administered 2019-05-08: 5 mg via ORAL
  Administered 2019-05-08 – 2019-05-09 (×3): 10 mg via ORAL
  Filled 2019-05-05 (×5): qty 2
  Filled 2019-05-05: qty 1

## 2019-05-05 MED ORDER — CEFAZOLIN SODIUM-DEXTROSE 2-4 GM/100ML-% IV SOLN
2.0000 g | Freq: Three times a day (TID) | INTRAVENOUS | Status: AC
  Administered 2019-05-05: 2 g via INTRAVENOUS
  Filled 2019-05-05: qty 100

## 2019-05-05 MED ORDER — MUPIROCIN 2 % EX OINT
1.0000 | TOPICAL_OINTMENT | Freq: Two times a day (BID) | CUTANEOUS | Status: AC
Start: 2019-05-05 — End: 2019-05-09
  Administered 2019-05-05 – 2019-05-09 (×10): 1 via NASAL
  Filled 2019-05-05 (×3): qty 22

## 2019-05-05 MED ORDER — POTASSIUM PHOSPHATES 15 MMOLE/5ML IV SOLN
20.0000 mmol | Freq: Once | INTRAVENOUS | Status: AC
  Administered 2019-05-05: 20 mmol via INTRAVENOUS
  Filled 2019-05-05: qty 6.67

## 2019-05-05 MED ORDER — MAGNESIUM SULFATE 2 GM/50ML IV SOLN
2.0000 g | Freq: Once | INTRAVENOUS | Status: AC
  Administered 2019-05-05: 2 g via INTRAVENOUS
  Filled 2019-05-05: qty 50

## 2019-05-05 MED ORDER — ONDANSETRON HCL 4 MG/2ML IJ SOLN
INTRAMUSCULAR | Status: DC | PRN
  Administered 2019-05-05: 4 mg via INTRAVENOUS

## 2019-05-05 MED ORDER — CHLORHEXIDINE GLUCONATE 0.12% ORAL RINSE (MEDLINE KIT)
15.0000 mL | Freq: Two times a day (BID) | OROMUCOSAL | Status: DC
  Administered 2019-05-05 – 2019-05-10 (×9): 15 mL via OROMUCOSAL

## 2019-05-05 MED ORDER — MORPHINE SULFATE (PF) 2 MG/ML IV SOLN
1.0000 mg | INTRAVENOUS | Status: DC | PRN
  Administered 2019-05-06 – 2019-05-08 (×14): 2 mg via INTRAVENOUS
  Filled 2019-05-05 (×14): qty 1

## 2019-05-05 MED ORDER — ONDANSETRON HCL 4 MG/2ML IJ SOLN: 4.0000 mg | Freq: Four times a day (QID) | INTRAMUSCULAR | Status: DC | PRN

## 2019-05-05 MED ORDER — PANTOPRAZOLE SODIUM 40 MG PO TBEC
40.0000 mg | DELAYED_RELEASE_TABLET | Freq: Every day | ORAL | Status: DC
  Administered 2019-05-07 – 2019-05-09 (×3): 40 mg via ORAL
  Filled 2019-05-05 (×3): qty 1

## 2019-05-05 MED FILL — Thrombin For Soln 5000 Unit: CUTANEOUS | Qty: 5000 | Status: AC
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
## 2019-05-05 NOTE — Progress Notes (Signed)
PT Cancellation Note  Patient Details Name: [name] Ford MRN: [id number] DOB: 10/30/1985   Cancelled Treatment:    Reason Eval/Treat Not Completed: Medical issues which prohibited therapy  Pt remains on ventilator and orders for flat bedrest. Will follow and proceed with evaluation as appropriate.   Jerolyn Center, PT Pager [phone]  Zena Amos 05/05/2019, 8:25 AM
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
## 2019-05-05 NOTE — Progress Notes (Signed)
Neurosurgery Service Post-operative progress note  Assessment & Plan: 34 y.o. man s/p thoracic decompression & instrumented fusion. No intra-op issues, pt did lose 1800cc EBL due to diffuse fracture bleeding. Drain in place, I expect it to be high output for 2-3 days.   -fracture is stabilized, can d/c thoracolumbar spine precautions, continue C collar for cervical fractures  [name]  05/05/19 12:43 AM

## 2019-05-05 NOTE — Brief Op Note (Signed)
05/04/2019 - 05/05/2019  12:42 AM  PATIENT:  [name] Ford  34 y.o. male  PRE-OPERATIVE DIAGNOSIS:  THORACIC SPINAL FRACTURE, SPINAL CORD INJURY  POST-OPERATIVE DIAGNOSIS:  THORACIC SPINAL FRACTURE, SPINAL CORD INJURY  PROCEDURE:  Procedure(s): Thoracic one to Thoracic six decompression, Reduction of Fracture, Instrumented Fusion (N/A)  SURGEON:  Surgeon(s) and Role:    * Malyn Aytes, Clovis Pu, MD - Primary  PHYSICIAN ASSISTANT:   ASSISTANTS: none   ANESTHESIA:   general  EBL:  2000 mL   BLOOD ADMINISTERED:none  DRAINS: ( ) Jackson-Pratt drain(s) with closed bulb suction in the subfascial thoracic spine   LOCAL MEDICATIONS USED:  LIDOCAINE   SPECIMEN:  No Specimen  DISPOSITION OF SPECIMEN:  N/A  COUNTS:  YES  TOURNIQUET:  * No tourniquets in log *  DICTATION: .Note written in EPIC  PLAN OF CARE: Admit to inpatient   PATIENT DISPOSITION:  ICU - intubated and hemodynamically stable.   Delay start of Pharmacological VTE agent (>24hrs) due to surgical blood loss or risk of bleeding: yes

## 2019-05-05 NOTE — Progress Notes (Signed)
RN called and update patient's Mom, [name] Ford and Dad [name] Ford. On patient's stable status and set up password of "[place]". Per Mom and dad, they are ok with [name] Ford, his fiance, being the designated visitor and for her to be able to get information on patient. RN will continue to monitor.
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
## 2019-05-05 NOTE — Transfer of Care (Signed)
Immediate Anesthesia Transfer of Care Note  Patient: [name] Ford  Procedure(s) Performed: Thoracic one to Thoracic six decompression, Reduction of Fracture, Instrumented Fusion (N/A Back)  Patient Location: ICU  Anesthesia Type:General  Level of Consciousness: Patient remains intubated per anesthesia plan  Airway & Oxygen Therapy: Patient remains intubated per anesthesia plan and Patient placed on Ventilator (see vital sign flow sheet for setting)  Post-op Assessment: Report given to RN and Post -op Vital signs reviewed and stable  Post vital signs: Reviewed and stable  Last Vitals:  Vitals Value Taken Time  BP 126/80 05/05/19 0046  Temp 33.5 C 05/05/19 0057  Pulse 42 05/05/19 0057  Resp 14 05/05/19 0057  SpO2 100 % 05/05/19 0057  Vitals shown include unvalidated device data.  Last Pain:  Vitals:   05/04/19 1714  TempSrc: Temporal  PainSc:      Report to Tashina RN in 4N ICU. Patient currently on propofol and neo gtt per ICU attendings order sets. RT at bedside, applied to ventilator, PRVC mode, 60% FiO2. VSS. Transfer of patient care in stable condition.    Complications: No apparent anesthesia complications

## 2019-05-05 NOTE — Anesthesia Postprocedure Evaluation (Signed)
Anesthesia Post Note  Patient: [name] Ford  Procedure(s) Performed: Thoracic one to Thoracic six decompression, Reduction of Fracture, Instrumented Fusion (N/A Back)     Patient location during evaluation: ICU Anesthesia Type: General Level of consciousness: patient remains intubated per anesthesia plan Pain management: pain level controlled Vital Signs Assessment: post-procedure vital signs reviewed and stable Respiratory status: patient remains intubated per anesthesia plan Cardiovascular status: stable Postop Assessment: no apparent nausea or vomiting Anesthetic complications: no    Last Vitals:  Vitals:   05/04/19 1815 05/04/19 1830  BP: 103/70 101/64  Pulse: 64 75  Resp: 16 12  Temp: (!) 34.8 C (!) 34.8 C  SpO2: 98% 99%    Last Pain:  Vitals:   05/04/19 1714  TempSrc: Temporal  PainSc:                  Jlee Harkless

## 2019-05-05 NOTE — Progress Notes (Signed)
OT Cancellation Note  Patient Details Name: [name] MRN: [id number] DOB: 17-Dec-1985   Cancelled Treatment:    Reason Eval/Treat Not Completed: Patient not medically ready. Intubated, sedated, and to lay flat per MD order. Will return as schedule allows.   [name] M [name] Sharel Behne MSOT, OTR/L Acute Rehab Pager: [phone] Office: [phone] 05/05/2019, 7:21 AM

## 2019-05-05 NOTE — Plan of Care (Signed)
  Problem: Activity: Goal: Ability to tolerate increased activity will improve Outcome: Progressing   

## 2019-05-06 ENCOUNTER — Inpatient Hospital Stay (HOSPITAL_COMMUNITY): Payer: Medicaid Other

## 2019-05-06 LAB — CBC
HCT: 25.4 % — ABNORMAL LOW (ref 39.0–52.0)
Hemoglobin: 8.3 g/dL — ABNORMAL LOW (ref 13.0–17.0)
MCH: 31.1 pg (ref 26.0–34.0)
MCHC: 32.7 g/dL (ref 30.0–36.0)
MCV: 95.1 fL (ref 80.0–100.0)
Platelets: 120 10*3/uL — ABNORMAL LOW (ref 150–400)
RBC: 2.67 MIL/uL — ABNORMAL LOW (ref 4.22–5.81)
RDW: 14.6 % (ref 11.5–15.5)
WBC: 11.9 10*3/uL — ABNORMAL HIGH (ref 4.0–10.5)
nRBC: 0 % (ref 0.0–0.2)

## 2019-05-06 LAB — BASIC METABOLIC PANEL
Anion gap: 7 (ref 5–15)
BUN: 9 mg/dL (ref 6–20)
CO2: 24 mmol/L (ref 22–32)
Calcium: 7.4 mg/dL — ABNORMAL LOW (ref 8.9–10.3)
Chloride: 112 mmol/L — ABNORMAL HIGH (ref 98–111)
Creatinine, Ser: 0.94 mg/dL (ref 0.61–1.24)
GFR calc Af Amer: >60 mL/min (ref >60)
GFR calc non Af Amer: >60 mL/min (ref >60)
Glucose, Bld: 109 mg/dL — ABNORMAL HIGH (ref 70–99)
Potassium: 4.3 mmol/L (ref 3.5–5.1)
Sodium: 143 mmol/L (ref 135–145)

## 2019-05-06 LAB — PHOSPHORUS: Phosphorus: 2.1 mg/dL — ABNORMAL LOW (ref 2.5–4.6)

## 2019-05-06 LAB — MAGNESIUM: Magnesium: 2 mg/dL (ref 1.7–2.4)

## 2019-05-06 MED ORDER — FENTANYL CITRATE (PF) 100 MCG/2ML IJ SOLN
50.0000 ug | INTRAMUSCULAR | Status: DC | PRN
  Administered 2019-05-07: 100 ug via INTRAVENOUS
  Administered 2019-05-07: 50 ug via INTRAVENOUS
  Administered 2019-05-08: 100 ug via INTRAVENOUS
  Filled 2019-05-06 (×3): qty 2

## 2019-05-06 MED ORDER — SODIUM PHOSPHATES 45 MMOLE/15ML IV SOLN
30.0000 mmol | Freq: Once | INTRAVENOUS | Status: AC
  Administered 2019-05-06: 30 mmol via INTRAVENOUS
  Filled 2019-05-06: qty 10

## 2019-05-06 MED ORDER — IPRATROPIUM-ALBUTEROL 0.5-2.5 (3) MG/3ML IN SOLN: 3.0000 mL | RESPIRATORY_TRACT | Status: DC | PRN

## 2019-05-06 NOTE — Procedures (Signed)
Extubation Procedure Note  Patient Details:   Name: [name] Ford DOB: [birth year] MRN: [id number]   Airway Documentation:    Vent end date: 05/06/19 Vent end time: 1120   Evaluation  O2 sats: transiently fell during during procedure Complications: No apparent complications Patient did tolerate procedure well. Bilateral Breath Sounds: Rhonchi, Diminished   Yes   Positive cuff leak noted. Patient placed on [place] 6 L with humidity, desats to low 80's. Patient placed on Venturi mask 14L 55%, sats improved to 888-92%.  MD notified.  No stridor noted. Patient able to cough and clear secretions.  Patient was not able to use the incentive spirometer at this time due to clearing secretions.  [name] 05/06/2019, 11:55 AM

## 2019-05-06 NOTE — Progress Notes (Signed)
210 ml of Fentanyl gtt wasted and witnessed by Aris Lot, RN
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
## 2019-05-06 NOTE — Progress Notes (Signed)
Patient ID: [name] Ford, male   DOB: [birth year], 34 y.o.   MRN: [id number] Follow up - Trauma Critical Care  Patient Details:    [name] Ford is an 34 y.o. male.  Lines/tubes : Airway 7.5 mm (Active)  Secured at (cm) 24 cm 05/06/19 0315  Measured From Lips 05/06/19 0315  Secured Location Right 05/06/19 0315  Secured By [employer] 05/06/19 0315  Tube Holder Repositioned Yes 05/06/19 0315  Cuff Pressure (cm H2O) 30 cm H2O 05/05/19 1931  Site Condition Dry 05/06/19 0315     Arterial Line 05/04/19 Left (Active)  Site Assessment Clean;Dry;Intact 05/05/19 2000  Line Status Pulsatile blood flow;Positional 05/05/19 2000  Art Line Waveform Appropriate 05/05/19 2000  Art Line Interventions Connections checked and tightened 05/05/19 2000  Color/Movement/Sensation Capillary refill less than 3 sec 05/05/19 2000  Dressing Type Transparent;Occlusive 05/05/19 2000  Dressing Status Clean;Dry;Intact 05/05/19 2000  Dressing Change Due 05/08/19 05/05/19 2000     Closed System Drain 1 Posterior Back Bulb (JP) (Active)  Site Description Unable to view 05/05/19 2000  Dressing Status Clean;Dry;Intact 05/05/19 2000  Drainage Appearance Bloody 05/05/19 2000  Status To suction (Charged) 05/05/19 2000  Output (mL) 60 mL 05/06/19 0700     Urethral Catheter Ricke Hey.- EMT Temperature probe;Straight-tip 16 Fr. (Active)  Indication for Insertion or Continuance of Catheter Unstable spinal/crush injuries / Multisystem Trauma 05/05/19 2000  Site Assessment Clean;Intact 05/05/19 2000  Catheter Maintenance Bag below level of bladder;Catheter secured;Drainage bag/tubing not touching floor;Insertion date on drainage bag;No dependent loops;Seal intact 05/05/19 2000  Collection Container Standard drainage bag 05/05/19 2000  Securement Method Securing device (Describe) 05/05/19 2000  Urinary Catheter Interventions (if applicable) Unclamped 05/05/19 2000  Output (mL) 100 mL 05/06/19 0600     Microbiology/Sepsis markers: Results for orders placed or performed during the hospital encounter of 05/04/19  Respiratory Panel by RT PCR (Flu A&B, Covid) - Nasopharyngeal Swab     Status: None   Collection Time: 05/04/19  5:14 PM   Specimen: Nasopharyngeal Swab  Result Value Ref Range Status   SARS Coronavirus 2 by RT PCR NEGATIVE NEGATIVE Final    Comment: (NOTE) SARS-CoV-2 target nucleic acids are NOT DETECTED. The SARS-CoV-2 RNA is generally detectable in upper respiratoy specimens during the acute phase of infection. The lowest concentration of SARS-CoV-2 viral copies this assay can detect is 131 copies/mL. A negative result does not preclude SARS-Cov-2 infection and should not be used as the sole basis for treatment or other patient management decisions. A negative result may occur with  improper specimen collection/handling, submission of specimen other than nasopharyngeal swab, presence of viral mutation(s) within the areas targeted by this assay, and inadequate number of viral copies (<131 copies/mL). A negative result must be combined with clinical observations, patient history, and epidemiological information. The expected result is Negative. Fact Sheet for Patients:  https://www.moore.com/ Fact Sheet for Healthcare Providers:  https://www.young.biz/ This test is not yet ap proved or cleared by the [place] FDA and  has been authorized for detection and/or diagnosis of SARS-CoV-2 by FDA under an Emergency Use Authorization (EUA). This EUA will remain  in effect (meaning this test can be used) for the duration of the COVID-19 declaration under Section 564(b)(1) of the Act, 21 U.S.C. section 360bbb-3(b)(1), unless the authorization is terminated or revoked sooner.    Influenza A by PCR NEGATIVE NEGATIVE Final   Influenza B by PCR NEGATIVE NEGATIVE Final    Comment: (NOTE) The Xpert Xpress SARS-CoV-2/FLU/RSV assay is  intended as an aid in  the diagnosis of influenza from Nasopharyngeal swab specimens and  should not be used as a sole basis for treatment. Nasal washings and  aspirates are unacceptable for Xpert Xpress SARS-CoV-2/FLU/RSV  testing. Fact Sheet for Patients: https://www.moore.com/ Fact Sheet for Healthcare Providers: https://www.young.biz/ This test is not yet approved or cleared by the [place] FDA and  has been authorized for detection and/or diagnosis of SARS-CoV-2 by  FDA under an Emergency Use Authorization (EUA). This EUA will remain  in effect (meaning this test can be used) for the duration of the  Covid-19 declaration under Section 564(b)(1) of the Act, 21  U.S.C. section 360bbb-3(b)(1), unless the authorization is  terminated or revoked. Performed at [facility], [address], [place], [place] [address]   MRSA PCR Screening     Status: Abnormal   Collection Time: 05/05/19  1:08 AM   Specimen: Nasal Mucosa; Nasopharyngeal  Result Value Ref Range Status   MRSA by PCR POSITIVE (A) NEGATIVE Final    Comment: CRITICAL RESULT CALLED TO, READ BACK BY AND VERIFIED WITH: RN TJacqulyn Liner [phone] @[phone]  THANEY Performed at [facility], [address] [place], [place]     Anti-infectives:  Anti-infectives (From admission, onward)   Start     Dose/Rate Route Frequency Ordered Stop   05/05/19 0800  ceFAZolin (ANCEF) IVPB 2g/100 mL premix     2 g 200 mL/hr over 30 Minutes Intravenous Every 8 hours 05/05/19 0102 05/05/19 0805   05/04/19 2112  bacitracin 50,000 Units in sodium chloride 0.9 % 500 mL irrigation  Status:  Discontinued       As needed 05/04/19 2112 05/05/19 0046   05/04/19 1945  ceFAZolin (ANCEF) IVPB 2g/100 mL premix     2 g 200 mL/hr over 30 Minutes Intravenous  Once 05/04/19 1931 05/05/19 0203      Best Practice/Protocols:  VTE Prophylaxis: Mechanical Continous Sedation  Consults: Treatment  Team:  [id number], MD    Studies:    Events:  Subjective:    Overnight Issues:   Objective:  Vital signs for last 24 hours: Temp:  [95.4 F (35.2 C)-99 F (37.2 C)] 98.6 F (37 C) (03/12 0700) Pulse Rate:  [47-69] 58 (03/12 0700) Resp:  [0-14] 12 (03/12 0700) BP: (85-117)/(42-56) 117/45 (03/12 0700) SpO2:  [96 %-100 %] 99 % (03/12 0700) Arterial Line BP: (97-118)/(43-54) 112/49 (03/12 0600) FiO2 (%):  [40 %] 40 % (03/12 0315)  Hemodynamic parameters for last 24 hours:    Intake/Output from previous day: 03/11 0701 - 03/12 0700 In: 5635.5 [I.V.:4973.7; IV Piggyback:661.8] Out: 2295 [Urine:1620; Drains:675]  Intake/Output this shift: No intake/output data recorded.  Vent settings for last 24 hours: Vent Mode: PRVC FiO2 (%):  [40 %] 40 % Set Rate:  [12 bmp] 12 bmp Vt Set:  2296 mL] 620 mL PEEP:  [5 cmH20] 5 cmH20 Plateau Pressure:  [26 cmH20-28 cmH20] 28 cmH20  Physical Exam:  General: on vent Neuro: arouses and F/C with UE, no movement LE HEENT/Neck: ETT and collar Resp: clear to auscultation bilaterally CVS: RRR GI: soft, NT Extremities: no edema, no erythema, pulses WNL  Results for orders placed or performed during the hospital encounter of 05/04/19 (from the past 24 hour(s))  CBC     Status: Abnormal   Collection Time: 05/06/19  5:22 AM  Result Value Ref Range   WBC 11.9 (H) 4.0 - 10.5 K/uL   RBC 2.67 (L) 4.22 -  5.81 MIL/uL   Hemoglobin 8.3 (L) 13.0 - 17.0 g/dL   HCT 25.4 (L) 39.0 - 52.0 %   MCV 95.1 80.0 - 100.0 fL   MCH 31.1 26.0 - 34.0 pg   MCHC 32.7 30.0 - 36.0 g/dL   RDW 14.6 11.5 - 15.5 %   Platelets 120 (L) 150 - 400 K/uL   nRBC 0.0 0.0 - 0.2 %  Basic metabolic panel     Status: Abnormal   Collection Time: 05/06/19  5:22 AM  Result Value Ref Range   Sodium 143 135 - 145 mmol/L   Potassium 4.3 3.5 - 5.1 mmol/L   Chloride 112 (H) 98 - 111 mmol/L   CO2 24 22 - 32 mmol/L   Glucose, Bld 109 (H) 70 - 99 mg/dL   BUN 9 6 - 20 mg/dL    Creatinine, Ser 0.94 0.61 - 1.24 mg/dL   Calcium 7.4 (L) 8.9 - 10.3 mg/dL   GFR calc non Af Amer >60 >60 mL/min   GFR calc Af Amer >60 >60 mL/min   Anion gap 7 5 - 15  Magnesium     Status: None   Collection Time: 05/06/19  5:22 AM  Result Value Ref Range   Magnesium 2.0 1.7 - 2.4 mg/dL  Phosphorus     Status: Abnormal   Collection Time: 05/06/19  5:22 AM  Result Value Ref Range   Phosphorus 2.1 (L) 2.5 - 4.6 mg/dL    Assessment & Plan: Present on Admission: . Closed fracture of thoracic spine with spinal cord lesion ([place])    LOS: 2 days   Additional comments:I reviewed the patient's new clinical lab test results. . [facility] TBI/subgaleal hematoma - per Dr. Zada Finders. F/C. R orbit FX - per Dr. Janace Hoard Occipital condyle, C1 and C7 FXs - collar per Dr. Zada Finders Complex T3 and T4 FXs - SCI likely complete, S/P fusion T1-6 by Dr. Zada Finders 3/11 FX R 2nd and L 3rd ribs AKI - mild and resolving ABL anemia Acute hypoxic ventilator dependent respiratory failure - wean to extubate today ETOH abuse - watch for WD, will try to assess use Neurogenic urinary retention - foley FEN - replete phos again, try clears if able to extubate VTE - consider Lovenox 3/13 per Dr. Zada Finders Dispo - ICU, vent wean Critical Care Total Time*: 36 Minutes  Georganna Skeans, MD, MPH, FACS Trauma & General Surgery Use [email or web address] to contact on call provider  05/06/2019  *Care during the described time interval was provided by me. I have reviewed this patient's available data, including medical history, events of note, physical examination and test results as part of my evaluation.

## 2019-05-06 NOTE — Progress Notes (Signed)
Rehab Admissions Coordinator Note:  Patient was screened by Clois Dupes for appropriateness for an Inpatient Acute Rehab Consult per therapy recs.  At this time, we are recommending Inpatient Rehab consult. I will place order per protocol.  Clois Dupes RN MSN 05/06/2019, 6:52 PM  I can be reached at [phone].

## 2019-05-06 NOTE — Evaluation (Signed)
Physical Therapy Evaluation Patient Details Name: [name] Ford MRN: [id number] DOB: [birth year] Today's Date: 05/06/2019   History of Present Illness  34 y.o. man s/p [facility] w/ T5 ASIA A 2/2 severe T3/T4 fracture dislocation, also w/ C7 facet frx, C1 frx, occipital condyle frx.  Head Ct showing Right frontoparietal vertex subgaleal hematoma. No significant past medical history.  Clinical Impression  Patient presents with decreased mobility due to pain, paraplegia, decreased activity tolerance, decreased balance and will benefit from skilled PT in the acute setting to progress mobility as tolerated and initiate SCI education.  Patient requiring +2 A for rolling with significant cervical pain.  VSS, but also limited due to decreased arousal.  Previously very active working in [employer].  He has supportive family, but wife works and cares for three children.  Initiated conversation about rehab options.  She is open to getting him to rehab facility best suited for his needs.  Will continue conversations and evaluation of progress to optimize patient outcomes and maximize family support.     Follow Up Recommendations CIR    Equipment Recommendations  Wheelchair cushion (measurements PT);Wheelchair (measurements PT)    Recommendations for Other Services Rehab consult     Precautions / Restrictions Precautions Precautions: Fall;Cervical Precaution Comments: Reviewed cervical precautions and wearing of Aspen at all times Required Braces or Orthoses: Cervical Brace Cervical Brace: Hard collar;At all times Restrictions Weight Bearing Restrictions: No      Mobility  Bed Mobility Overal bed mobility: Needs Assistance Bed Mobility: Rolling Rolling: Max assist;+2 for physical assistance         General bed mobility comments: Max A +2 to bring hips over during rolling. Cues and assist for reach over midline to then grab and pull at rails. Once pt able to pull at rails, pt was able to  participate and maintain side lying position with Mod at hips for support.  EOB deferred due to pain  Transfers                    Ambulation/Gait                Stairs            Wheelchair Mobility    Modified Rankin (Stroke Patients Only)       Balance                                             Pertinent Vitals/Pain Pain Assessment: 0-10 Pain Score: 10-Worst pain ever Pain Location: Back of neck Pain Descriptors / Indicators: Discomfort;Grimacing Pain Intervention(s): Limited activity within patient's tolerance;Monitored during session;Repositioned    Home Living Family/patient expects to be discharged to:: Private residence Living Arrangements: Spouse/significant other;Children Available Help at Discharge: Family(12,6,13 mo) Type of Home: House Home Access: Stairs to enter   Secretary/administrator of Steps: 1 Home Layout: Two level;Bed/bath upstairs Home Equipment: None      Prior Function Level of Independence: Independent         Comments: Works in Psychologist, counselling   Dominant Hand: Right    Extremity/Trunk Assessment   Upper Extremity Assessment Upper Extremity Assessment: Defer to OT evaluation RUE Deficits / Details: Able to perform AROM at hand, wrist, and elbow. Limited shoulder flexion; however, possible due to weakness and heaviness. Weak grasp. increased edema.  RUE Coordination: decreased fine motor;decreased  gross motor LUE Deficits / Details: Able to perform AROM at hand, wrist, and elbow. Limited shoulder flexion; however, possible due to weakness and heaviness. Weak grasp. increased edema.  LUE Coordination: decreased fine motor;decreased gross motor    Lower Extremity Assessment Lower Extremity Assessment: RLE deficits/detail;LLE deficits/detail RLE Deficits / Details: PROM WFL, no active movement, no sensation RLE Sensation: decreased light touch;decreased proprioception LLE  Deficits / Details: PROM WFL, no active movement, no sensation LLE Sensation: decreased light touch;decreased proprioception    Cervical / Trunk Assessment Cervical / Trunk Assessment: Other exceptions Cervical / Trunk Exceptions: sensation decreased just above nipple line  Communication   Communication: No difficulties  Cognition Arousal/Alertness: Lethargic Behavior During Therapy: Flat affect Overall Cognitive Status: Impaired/Different from baseline Area of Impairment: Attention;Memory;Following commands;Safety/judgement;Awareness;Problem solving                   Current Attention Level: Sustained Memory: Decreased short-term memory Following Commands: Follows one step commands with increased time;Follows one step commands consistently Safety/Judgement: Decreased awareness of deficits;Decreased awareness of safety Awareness: Intellectual Problem Solving: Slow processing;Difficulty sequencing General Comments: Pt able to state he is in the hospital for a [facility], the month, and the year. Pt requiring increased time for processing and increased cues. Pt with increased lethargy and requiring cues for opening eyes and sustaining attention      General Comments General comments (skin integrity, edema, etc.): on 6L HFNC SpO2 maintained in 90's, BP stable with in bed activity; wife present and supportive    Exercises Other Exercises Other Exercises: PROM hip flexion, rotation, knee flexion and PF/DF   Assessment/Plan    PT Assessment Patient needs continued PT services  PT Problem List Decreased strength;Decreased activity tolerance;Decreased mobility;Decreased safety awareness;Pain;Impaired sensation;Decreased knowledge of precautions;Decreased knowledge of use of DME;Decreased balance       PT Treatment Interventions DME instruction;Balance training;Wheelchair mobility training;Patient/family education;Therapeutic exercise;Functional mobility training;Therapeutic activities     PT Goals (Current goals can be found in the Care Plan section)  Acute Rehab PT Goals Patient Stated Goal: "Want the best rehab" - wife PT Goal Formulation: With patient/family Time For Goal Achievement: 05/20/19 Potential to Achieve Goals: Fair    Frequency Min 4X/week   Barriers to discharge        Co-evaluation PT/OT/SLP Co-Evaluation/Treatment: Yes Reason for Co-Treatment: Complexity of the patient's impairments (multi-system involvement);For patient/therapist safety;To address functional/ADL transfers PT goals addressed during session: Mobility/safety with mobility;Strengthening/ROM OT goals addressed during session: ADL's and self-care       AM-PAC PT "6 Clicks" Mobility  Outcome Measure Help needed turning from your back to your side while in a flat bed without using bedrails?: Total Help needed moving from lying on your back to sitting on the side of a flat bed without using bedrails?: Total Help needed moving to and from a bed to a chair (including a wheelchair)?: Total Help needed standing up from a chair using your arms (e.g., wheelchair or bedside chair)?: Total Help needed to walk in hospital room?: Total Help needed climbing 3-5 steps with a railing? : Total 6 Click Score: 6    End of Session   Activity Tolerance: Patient limited by pain Patient left: in bed;with call bell/phone within reach;with family/visitor present   PT Visit Diagnosis: Other abnormalities of gait and mobility (R26.89);Other (comment);Pain(Paraplegia) Pain - part of body: (cervical)    Time: 5329-9242 PT Time Calculation (min) (ACUTE ONLY): 29 min   Charges:   PT Evaluation $PT Eval Moderate Complexity:  1 Mod          Sheran Lawless, [place] [facility] [phone] 05/06/2019   Elray Mcgregor 05/06/2019, 5:45 PM

## 2019-05-06 NOTE — Evaluation (Signed)
Occupational Therapy Evaluation Patient Details Name: [name] Ford MRN: [id number] DOB: Jan 16, 1986 Today's Date: 05/06/2019    History of Present Illness 34 y.o. man s/p [facility] w/ T5 ASIA A 2/2 severe T3/T4 fracture dislocation, also w/ C7 facet frx, C1 frx, occipital condyle frx.  Head Ct showing Right frontoparietal vertex subgaleal hematoma. No significant past medical history.    Clinical Impression   PTA, pt was living with his wife and three children and was independent. Pt currently requiring Max-Total A for ADLs and bed mobility. Pt presenting with decreased sensation mid-trunk to LEs; able to perform AROM of BUEs. Pt reporting significant pain at neck. Agreeable to perform rolling in bed. Pt requiring Max A +2 for rolling and using rails to pulls with BUEs increasing his participation. Pt will require further acute OT to facilitate safe dc. Due to pt's age, PLOF, support, and motivation, recommend dc to CIR for further OT to optimize safety, independence with ADLs, and return to PLOF as well as decrease caregiver burden. Also discussed other inpatient rehabs centers for SCI with wife and will continue conversation.     Follow Up Recommendations  CIR;Supervision/Assistance - 24 hour    Equipment Recommendations  Other (comment)(Defer to next venue)    Recommendations for Other Services PT consult;Rehab consult     Precautions / Restrictions Precautions Precautions: Fall;Cervical Precaution Comments: Reviewed cervical precautions and wearing of Aspen at all times Required Braces or Orthoses: Cervical Brace Cervical Brace: Hard collar;At all times Restrictions Weight Bearing Restrictions: No      Mobility Bed Mobility Overal bed mobility: Needs Assistance Bed Mobility: Rolling Rolling: Max assist;+2 for physical assistance         General bed mobility comments: Max A +2 to bring hips over during rolling. Cues and assist for reach over midline to then grab and pull at  rails. Once pt able to pull at rails, pt was able to participate and maintain side lying position with Mod at hips for support  Transfers                      Balance                                           ADL either performed or assessed with clinical judgement   ADL Overall ADL's : Needs assistance/impaired Eating/Feeding: Minimal assistance;Sitting   Grooming: Moderate assistance;Sitting   Upper Body Bathing: Maximal assistance;Bed level   Lower Body Bathing: Total assistance;Bed level   Upper Body Dressing : Total assistance;Bed level   Lower Body Dressing: Total assistance;Bed level                 General ADL Comments: Pt requiring Max-Total A for ADLs and bed mobility     Vision         Perception     Praxis      Pertinent Vitals/Pain Pain Assessment: 0-10 Pain Score: 10-Worst pain ever Pain Location: Back of neck Pain Descriptors / Indicators: Discomfort;Grimacing Pain Intervention(s): Monitored during session;Limited activity within patient's tolerance;Repositioned     Hand Dominance Right   Extremity/Trunk Assessment Upper Extremity Assessment Upper Extremity Assessment: RUE deficits/detail;LUE deficits/detail RUE Deficits / Details: Able to perform AROM at hand, wrist, and elbow. Limited shoulder flexion; however, possible due to weakness and heaviness. Weak grasp. increased edema.  RUE Coordination: decreased fine motor;decreased gross motor  LUE Deficits / Details: Able to perform AROM at hand, wrist, and elbow. Limited shoulder flexion; however, possible due to weakness and heaviness. Weak grasp. increased edema.  LUE Coordination: decreased fine motor;decreased gross motor   Lower Extremity Assessment Lower Extremity Assessment: Defer to PT evaluation   Cervical / Trunk Assessment Cervical / Trunk Assessment: Other exceptions Cervical / Trunk Exceptions: Decreased sensation at mid trunk   Communication  Communication Communication: No difficulties   Cognition Arousal/Alertness: Lethargic Behavior During Therapy: Flat affect Overall Cognitive Status: Impaired/Different from baseline Area of Impairment: Attention;Memory;Following commands;Safety/judgement;Awareness;Problem solving                   Current Attention Level: Sustained;Focused Memory: Decreased short-term memory Following Commands: Follows one step commands inconsistently;Follows one step commands with increased time Safety/Judgement: Decreased awareness of deficits;Decreased awareness of safety Awareness: Intellectual Problem Solving: Slow processing;Difficulty sequencing;Requires verbal cues General Comments: Pt able to state he is in the hospital for a [facility], the month, and the year. Pt requiring increased time for processing and increased cues. Pt with increased lethargy and requiring cues for opening eyes and sustaining attention   General Comments  SpO2 90s on 6L O2. BP stable. HR stable. Wife present throughout    Exercises     Shoulder Instructions      Home Living Family/patient expects to be discharged to:: Private residence Living Arrangements: Spouse/significant other;Children Available Help at Discharge: (12, 6, 13 mo) Type of Home: House Home Access: Stairs to enter [employer] of Steps: 1   Home Layout: Two level;Bed/bath upstairs Alternate Level Stairs-Number of Steps: flight Alternate Level Stairs-Rails: Left Bathroom Shower/Tub: Chief Strategy Officer: Standard     Home Equipment: None          Prior Functioning/Environment Level of Independence: Independent        Comments: Works in Garment/textile technologist Problem List: Decreased strength;Decreased range of motion;Decreased activity tolerance;Impaired balance (sitting and/or standing);Pain;Decreased cognition;Decreased safety awareness;Decreased knowledge of use of DME or AE;Decreased knowledge of  precautions      OT Treatment/Interventions: Self-care/ADL training;Therapeutic exercise;Energy conservation;DME and/or AE instruction;Therapeutic activities;Patient/family education    OT Goals(Current goals can be found in the care plan section) Acute Rehab OT Goals Patient Stated Goal: "Want the best rehab" - wife OT Goal Formulation: With patient/family Time For Goal Achievement: 05/20/19 Potential to Achieve Goals: Good  OT Frequency: Min 2X/week   Barriers to D/C:            Co-evaluation PT/OT/SLP Co-Evaluation/Treatment: Yes Reason for Co-Treatment: For patient/therapist safety;To address functional/ADL transfers   OT goals addressed during session: ADL's and self-care      AM-PAC OT "6 Clicks" Daily Activity     Outcome Measure Help from another person eating meals?: A Little Help from another person taking care of personal grooming?: A Lot Help from another person toileting, which includes using toliet, bedpan, or urinal?: Total Help from another person bathing (including washing, rinsing, drying)?: A Lot Help from another person to put on and taking off regular upper body clothing?: A Lot Help from another person to put on and taking off regular lower body clothing?: Total 6 Click Score: 11   End of Session Equipment Utilized During Treatment: Cervical collar Nurse Communication: Mobility status;Other (comment)(activity tolerance)  Activity Tolerance: Patient tolerated treatment well;Patient limited by lethargy Patient left: in bed;with call bell/phone within reach;with family/visitor present  OT Visit Diagnosis: Unsteadiness on feet (R26.81);Other abnormalities  of gait and mobility (R26.89);Muscle weakness (generalized) (M62.81)                Time: 6060-0459 OT Time Calculation (min): 33 min Charges:  OT General Charges $OT Visit: 1 Visit OT Evaluation $OT Eval Moderate Complexity: 1 Mod  Kennth Vanbenschoten MSOT, OTR/L Acute Rehab Pager:  [phone] Office: [phone]  Theodoro Grist Daine Croker 05/06/2019, 5:07 PM

## 2019-05-07 LAB — BASIC METABOLIC PANEL
Anion gap: 7 (ref 5–15)
BUN: 8 mg/dL (ref 6–20)
CO2: 22 mmol/L (ref 22–32)
Calcium: 7.7 mg/dL — ABNORMAL LOW (ref 8.9–10.3)
Chloride: 112 mmol/L — ABNORMAL HIGH (ref 98–111)
Creatinine, Ser: 1.03 mg/dL (ref 0.61–1.24)
GFR calc Af Amer: >60 mL/min (ref >60)
GFR calc non Af Amer: >60 mL/min (ref >60)
Glucose, Bld: 102 mg/dL — ABNORMAL HIGH (ref 70–99)
Potassium: 3.8 mmol/L (ref 3.5–5.1)
Sodium: 141 mmol/L (ref 135–145)

## 2019-05-07 LAB — CBC
HCT: 23.2 % — ABNORMAL LOW (ref 39.0–52.0)
Hemoglobin: 7.7 g/dL — ABNORMAL LOW (ref 13.0–17.0)
MCH: 31.4 pg (ref 26.0–34.0)
MCHC: 33.2 g/dL (ref 30.0–36.0)
MCV: 94.7 fL (ref 80.0–100.0)
Platelets: 111 10*3/uL — ABNORMAL LOW (ref 150–400)
RBC: 2.45 MIL/uL — ABNORMAL LOW (ref 4.22–5.81)
RDW: 14 % (ref 11.5–15.5)
WBC: 11.7 10*3/uL — ABNORMAL HIGH (ref 4.0–10.5)
nRBC: 0 % (ref 0.0–0.2)

## 2019-05-07 MED ORDER — WHITE PETROLATUM EX OINT
TOPICAL_OINTMENT | CUTANEOUS | Status: AC
  Administered 2019-05-07: 0.2
  Filled 2019-05-07: qty 28.35

## 2019-05-07 NOTE — Progress Notes (Addendum)
Follow up - Trauma Critical Care  Patient Details:    [name] Ford is an 34 y.o. male.  Anti-infectives:  Anti-infectives (From admission, onward)   Start     Dose/Rate Route Frequency Ordered Stop   05/05/19 0800  ceFAZolin (ANCEF) IVPB 2g/100 mL premix     2 g 200 mL/hr over 30 Minutes Intravenous Every 8 hours 05/05/19 0102 05/05/19 0805   05/04/19 2112  bacitracin 50,000 Units in sodium chloride 0.9 % 500 mL irrigation  Status:  Discontinued       As needed 05/04/19 2112 05/05/19 0046   05/04/19 1945  ceFAZolin (ANCEF) IVPB 2g/100 mL premix     2 g 200 mL/hr over 30 Minutes Intravenous  Once 05/04/19 1931 05/05/19 0203      Best Practice/Protocols:  VTE Prophylaxis: Mechanical Continous Sedation  Consults: Treatment Team:  [name], MD    Subjective:    Overnight Issues:  No overnight events.    Objective:  Vital signs for last 24 hours: Temp:  [97.2 F (36.2 C)-100.2 F (37.9 C)] 99.1 F (37.3 C) (03/13 0820) Pulse Rate:  [25-100] 80 (03/13 0820) Resp:  [0-24] 20 (03/13 0820) BP: (90-148)/(24-66) 119/64 (03/13 0800) SpO2:  [83 %-100 %] 94 % (03/13 0820) Arterial Line BP: (85-165)/(48-98) 161/81 (03/13 0645) FiO2 (%):  [40 %-55 %] 55 % (03/12 1126)  Intake/Output from previous day: 03/12 0701 - 03/13 0700 In: 2815.4 [I.V.:2555.3; IV Piggyback:260] Out: 2325 [Urine:1685; Drains:640]  Intake/Output this shift: Total I/O In: -  Out: 205 [Urine:125; Drains:80]  Vent settings for last 24 hours: Vent Mode: PSV;CPAP FiO2 (%):  [40 %-55 %] 55 % PEEP:  [5 cmH20] 5 cmH20 Pressure Support:  [5 cmH20] 5 cmH20  Physical Exam:  General: extubated.  Sitting up.  Alert and oriented. Neuro: follows commands.  Good strength BUE.  No motor BLE.   HEENT/Neck: cervical collar in place.  Resp: clear to auscultation bilaterally.  No wheezes.   CVS: RRR, no murmurs.   GI: soft, NT, ND, no HSM Extremities: no edema, no erythema, pulses WNL  Results for  orders placed or performed during the hospital encounter of 05/04/19 (from the past 24 hour(s))  CBC     Status: Abnormal   Collection Time: 05/07/19  3:50 AM  Result Value Ref Range   WBC 11.7 (H) 4.0 - 10.5 K/uL   RBC 2.45 (L) 4.22 - 5.81 MIL/uL   Hemoglobin 7.7 (L) 13.0 - 17.0 g/dL   HCT 94.8 (L) 54.6 - 27.0 %   MCV 94.7 80.0 - 100.0 fL   MCH 31.4 26.0 - 34.0 pg   MCHC 33.2 30.0 - 36.0 g/dL   RDW 35.0 09.3 - 81.8 %   Platelets 111 (L) 150 - 400 K/uL   nRBC 0.0 0.0 - 0.2 %  Basic metabolic panel     Status: Abnormal   Collection Time: 05/07/19  3:50 AM  Result Value Ref Range   Sodium 141 135 - 145 mmol/L   Potassium 3.8 3.5 - 5.1 mmol/L   Chloride 112 (H) 98 - 111 mmol/L   CO2 22 22 - 32 mmol/L   Glucose, Bld 102 (H) 70 - 99 mg/dL   BUN 8 6 - 20 mg/dL   Creatinine, Ser 2.99 0.61 - 1.24 mg/dL   Calcium 7.7 (L) 8.9 - 10.3 mg/dL   GFR calc non Af Amer >60 >60 mL/min   GFR calc Af Amer >60 >60 mL/min   Anion gap 7 5 -  15    Assessment & Plan: Present on Admission: . 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 . [facility] TBI/subgaleal hematoma - per Dr. Zada Finders. F/C. R orbit FX - per Dr. Janace Hoard Occipital condyle, C1 and C7 FXs - collar per Dr. Zada Finders Complex T3 and T4 FXs - SCI likely complete, S/P fusion T1-6 by Dr. Zada Finders 3/11 FX R 2nd and L 3rd ribs AKI - mild and resolving ABL anemia- stable Acute hypoxic ventilator dependent respiratory failure - resolved ETOH abuse - watch for WD, will try to assess use Neurogenic urinary retention - foley FEN - clear liquid diet. Protecting airway thus far with clears.  Recheck mag and phos tomorrow. VTE - consider Lovenox 3/13 per Dr. Zandra Abts - ICU today.  Some coughing with clears.  Seems to be protecting airway, but would like close observation for now.  D/c art line.    Milus Height, MD  FACS Surgical Oncology, General Surgery, Trauma and [facility], [place] for weekday/non holidays Check [email or web address] for coverage night/weekend/holidays  Do not use SecureChat as it is not reliable for patient care.     05/07/2019  *Care during the described time interval was provided by me. I have reviewed this patient's available data, including medical history, events of note, physical examination and test results as part of my evaluation.   Lines/tubes : Airway 7.5 mm (Active)  Secured at (cm) 24 cm 05/06/19 0315  Measured From Lips 05/06/19 0315  Secured Location Right 05/06/19 0315  Secured By [employer] 05/06/19 0315  Tube Holder Repositioned Yes 05/06/19 0315  Cuff Pressure (cm H2O) 30 cm H2O 05/05/19 1931  Site Condition Dry 05/06/19 0315     Arterial Line 05/04/19 Left (Active)  Site Assessment Clean;Dry;Intact 05/05/19 2000  Line Status Pulsatile blood flow;Positional 05/05/19 2000  Art Line Waveform Appropriate 05/05/19 2000  Art Line Interventions Connections checked and tightened 05/05/19 2000  Color/Movement/Sensation Capillary refill less than 3 sec 05/05/19 2000  Dressing Type Transparent;Occlusive 05/05/19 2000  Dressing Status Clean;Dry;Intact 05/05/19 2000  Dressing Change Due 05/08/19 05/05/19 2000     Closed System Drain 1 Posterior Back Bulb (JP) (Active)  Site Description Unable to view 05/05/19 2000  Dressing Status Clean;Dry;Intact 05/05/19 2000  Drainage Appearance Bloody 05/05/19 2000  Status To suction (Charged) 05/05/19 2000  Output (mL) 60 mL 05/06/19 0700     Urethral Catheter Joylene Grapes.- EMT Temperature probe;Straight-tip 16 Fr. (Active)  Indication for Insertion or Continuance of Catheter Unstable spinal/crush injuries / Multisystem Trauma 05/05/19 2000  Site Assessment Clean;Intact 05/05/19 2000  Catheter Maintenance Bag below level of bladder;Catheter secured;Drainage bag/tubing not touching  floor;Insertion date on drainage bag;No dependent loops;Seal intact 05/05/19 2000  Collection Container Standard drainage bag 05/05/19 2000  Securement Method Securing device (Describe) 05/05/19 2000  Urinary Catheter Interventions (if applicable) Unclamped 58/09/98 2000  Output (mL) 100 mL 05/06/19 0600    Microbiology/Sepsis markers: Results for orders placed or performed during the hospital encounter of 05/04/19  Respiratory Panel by RT PCR (Flu A&B, Covid) - Nasopharyngeal Swab     Status: None   Collection Time: 05/04/19  5:14 PM   Specimen: Nasopharyngeal Swab  Result Value Ref Range Status   SARS Coronavirus 2 by RT PCR NEGATIVE NEGATIVE Final    Comment: (NOTE) SARS-CoV-2 target nucleic acids are NOT DETECTED. The SARS-CoV-2  RNA is generally detectable in upper respiratoy specimens during the acute phase of infection. The lowest concentration of SARS-CoV-2 viral copies this assay can detect is 131 copies/mL. A negative result does not preclude SARS-Cov-2 infection and should not be used as the sole basis for treatment or other patient management decisions. A negative result may occur with  improper specimen collection/handling, submission of specimen other than nasopharyngeal swab, presence of viral mutation(s) within the areas targeted by this assay, and inadequate number of viral copies (<131 copies/mL). A negative result must be combined with clinical observations, patient history, and epidemiological information. The expected result is Negative. Fact Sheet for Patients:  https://www.moore.com/ Fact Sheet for Healthcare Providers:  https://www.young.biz/ This test is not yet ap proved or cleared by the [place] FDA and  has been authorized for detection and/or diagnosis of SARS-CoV-2 by FDA under an Emergency Use Authorization (EUA). This EUA will remain  in effect (meaning this test can be used) for the duration of  the COVID-19 declaration under Section 564(b)(1) of the Act, 21 U.S.C. section 360bbb-3(b)(1), unless the authorization is terminated or revoked sooner.    Influenza A by PCR NEGATIVE NEGATIVE Final   Influenza B by PCR NEGATIVE NEGATIVE Final    Comment: (NOTE) The Xpert Xpress SARS-CoV-2/FLU/RSV assay is intended as an aid in  the diagnosis of influenza from Nasopharyngeal swab specimens and  should not be used as a sole basis for treatment. Nasal washings and  aspirates are unacceptable for Xpert Xpress SARS-CoV-2/FLU/RSV  testing. Fact Sheet for Patients: https://www.moore.com/ Fact Sheet for Healthcare Providers: https://www.young.biz/ This test is not yet approved or cleared by the [place] FDA and  has been authorized for detection and/or diagnosis of SARS-CoV-2 by  FDA under an Emergency Use Authorization (EUA). This EUA will remain  in effect (meaning this test can be used) for the duration of the  Covid-19 declaration under Section 564(b)(1) of the Act, 21  U.S.C. section 360bbb-3(b)(1), unless the authorization is  terminated or revoked. Performed at [facility], [address], [place], [place] [address]   MRSA PCR Screening     Status: Abnormal   Collection Time: 05/05/19  1:08 AM   Specimen: Nasal Mucosa; Nasopharyngeal  Result Value Ref Range Status   MRSA by PCR POSITIVE (A) NEGATIVE Final    Comment: CRITICAL RESULT CALLED TO, READ BACK BY AND VERIFIED WITH: RN TJacqulyn Liner [phone] @0234  THANEY Performed at [facility], [address], [place], [place]

## 2019-05-07 NOTE — Progress Notes (Signed)
Patient ID: [name] Ford, male   DOB: 22-Oct-1985, 34 y.o.   MRN: [id number] BP 102/67   Pulse 64   Temp 98.8 F (37.1 C)   Resp 15   Ht 6' (1.829 m)   Wt 108.9 kg   SpO2 93%   BMI 32.55 kg/m  Alert, following commands In cervical collar Wound is clean, dry Drain still with profuse sanguineous drainage. Will leave in place.

## 2019-05-08 LAB — BASIC METABOLIC PANEL
Anion gap: 6 (ref 5–15)
BUN: 11 mg/dL (ref 6–20)
CO2: 21 mmol/L — ABNORMAL LOW (ref 22–32)
Calcium: 7.9 mg/dL — ABNORMAL LOW (ref 8.9–10.3)
Chloride: 112 mmol/L — ABNORMAL HIGH (ref 98–111)
Creatinine, Ser: 1.09 mg/dL (ref 0.61–1.24)
GFR calc Af Amer: >60 mL/min (ref >60)
GFR calc non Af Amer: >60 mL/min (ref >60)
Glucose, Bld: 116 mg/dL — ABNORMAL HIGH (ref 70–99)
Potassium: 3.8 mmol/L (ref 3.5–5.1)
Sodium: 139 mmol/L (ref 135–145)

## 2019-05-08 LAB — CBC
HCT: 22.9 % — ABNORMAL LOW (ref 39.0–52.0)
Hemoglobin: 7.8 g/dL — ABNORMAL LOW (ref 13.0–17.0)
MCH: 31.5 pg (ref 26.0–34.0)
MCHC: 34.1 g/dL (ref 30.0–36.0)
MCV: 92.3 fL (ref 80.0–100.0)
Platelets: 135 10*3/uL — ABNORMAL LOW (ref 150–400)
RBC: 2.48 MIL/uL — ABNORMAL LOW (ref 4.22–5.81)
RDW: 13.3 % (ref 11.5–15.5)
WBC: 10.7 10*3/uL — ABNORMAL HIGH (ref 4.0–10.5)
nRBC: 0 % (ref 0.0–0.2)

## 2019-05-08 LAB — TRIGLYCERIDES: Triglycerides: 67 mg/dL (ref <150)

## 2019-05-08 LAB — PHOSPHORUS: Phosphorus: 2.1 mg/dL — ABNORMAL LOW (ref 2.5–4.6)

## 2019-05-08 LAB — MAGNESIUM: Magnesium: 1.9 mg/dL (ref 1.7–2.4)

## 2019-05-08 MED ORDER — HYDROMORPHONE HCL 1 MG/ML IJ SOLN
0.5000 mg | INTRAMUSCULAR | Status: DC | PRN
  Administered 2019-05-08 – 2019-05-09 (×3): 0.5 mg via INTRAVENOUS
  Filled 2019-05-08 (×3): qty 1

## 2019-05-08 NOTE — Progress Notes (Signed)
Patient ID: [name] Ford, male   DOB: [birth year], 34 y.o.   MRN: [id number] Alert, following commands In cervical collar No neurological change Wound is cleaN, DRY

## 2019-05-08 NOTE — Progress Notes (Signed)
Patient ID: [name] Ford, male   DOB: 07/12/1985, 34 y.o.   MRN: [id number] Ok to start dvt prophylaxis

## 2019-05-08 NOTE — Progress Notes (Signed)
Follow up - Trauma Critical Care  Patient Details:    [name] Ford is an 34 y.o. male.  Anti-infectives:  Anti-infectives (From admission, onward)   Start     Dose/Rate Route Frequency Ordered Stop   05/05/19 0800  ceFAZolin (ANCEF) IVPB 2g/100 mL premix     2 g 200 mL/hr over 30 Minutes Intravenous Every 8 hours 05/05/19 0102 05/05/19 0805   05/04/19 2112  bacitracin 50,000 Units in sodium chloride 0.9 % 500 mL irrigation  Status:  Discontinued       As needed 05/04/19 2112 05/05/19 0046   05/04/19 1945  ceFAZolin (ANCEF) IVPB 2g/100 mL premix     2 g 200 mL/hr over 30 Minutes Intravenous  Once 05/04/19 1931 05/05/19 0203      Best Practice/Protocols:  VTE Prophylaxis: Mechanical   Consults: Treatment Team:  [name], MD    Subjective:    Overnight Issues: febrile overnight. Feels well.    Objective:  Vital signs for last 24 hours: Temp:  [98.8 F (37.1 C)-101.3 F (38.5 C)] 99.7 F (37.6 C) (03/14 0730) Pulse Rate:  [25-106] 64 (03/14 0730) Resp:  [12-23] 17 (03/14 0730) BP: (102-140)/(50-79) 129/79 (03/14 0700) SpO2:  [90 %-100 %] 90 % (03/14 0730)  Intake/Output from previous day: 03/13 0701 - 03/14 0700 In: 1952.4 [P.O.:360; I.V.:1592.4] Out: 1320 [Urine:700; Drains:620]  Intake/Output this shift: No intake/output data recorded.  Vent settings for last 24 hours:    Physical Exam:  General: extubated.  Sitting up.  Alert and oriented. Neuro: follows commands.  Good strength BUE.  No motor BLE.   HEENT/Neck: cervical collar in place.  Resp: clear to auscultation bilaterally.  No wheezes.   CVS: RRR, no murmurs.   GI: soft, NT, ND, no HSM Extremities: no edema, no erythema, pulses WNL  Results for orders placed or performed during the hospital encounter of 05/04/19 (from the past 24 hour(s))  Triglycerides     Status: None   Collection Time: 05/08/19  1:52 AM  Result Value Ref Range   Triglycerides 67 <150 mg/dL  CBC     Status:  Abnormal   Collection Time: 05/08/19  1:52 AM  Result Value Ref Range   WBC 10.7 (H) 4.0 - 10.5 K/uL   RBC 2.48 (L) 4.22 - 5.81 MIL/uL   Hemoglobin 7.8 (L) 13.0 - 17.0 g/dL   HCT 28.7 (L) 86.7 - 67.2 %   MCV 92.3 80.0 - 100.0 fL   MCH 31.5 26.0 - 34.0 pg   MCHC 34.1 30.0 - 36.0 g/dL   RDW 09.4 70.9 - 62.8 %   Platelets 135 (L) 150 - 400 K/uL   nRBC 0.0 0.0 - 0.2 %  Basic metabolic panel     Status: Abnormal   Collection Time: 05/08/19  1:52 AM  Result Value Ref Range   Sodium 139 135 - 145 mmol/L   Potassium 3.8 3.5 - 5.1 mmol/L   Chloride 112 (H) 98 - 111 mmol/L   CO2 21 (L) 22 - 32 mmol/L   Glucose, Bld 116 (H) 70 - 99 mg/dL   BUN 11 6 - 20 mg/dL   Creatinine, Ser 3.66 0.61 - 1.24 mg/dL   Calcium 7.9 (L) 8.9 - 10.3 mg/dL   GFR calc non Af Amer >60 >60 mL/min   GFR calc Af Amer >60 >60 mL/min   Anion gap 6 5 - 15  Phosphorus     Status: Abnormal   Collection Time: 05/08/19  1:52 AM  Result Value Ref Range   Phosphorus 2.1 (L) 2.5 - 4.6 mg/dL  Magnesium     Status: None   Collection Time: 05/08/19  1:52 AM  Result Value Ref Range   Magnesium 1.9 1.7 - 2.4 mg/dL    Assessment & Plan: Present on Admission: . 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 . [facility] TBI/subgaleal hematoma - per Dr. Zada Finders. F/C. R orbit FX - per Dr. Janace Hoard Occipital condyle, C1 and C7 FXs - collar per Dr. Zada Finders Complex T3 and T4 FXs - SCI likely complete, S/P fusion T1-6 by Dr. Zada Finders 3/11 FX R 2nd and L 3rd ribs AKI - CR stable ABL anemia- stable Acute hypoxic ventilator dependent respiratory failure - resolved ETOH abuse - watch for WD, will try to assess use Neurogenic urinary retention - foley FEN - soft diet today. Decrease IVF--> DC if taking good PO VTE - consider Lovenox 3/13 per Dr. Zada Finders  Dispo - transfer to 4NP.  Tolerating clears, will advance diet.  If fever recurs workup with cultures/ CXR, for now start incentive spirometry.    Clovis Riley , MD [facility] [place] Check [email or web address] for coverage night/weekend/holidays  Do not use SecureChat as it is not reliable for patient care.     05/08/2019  *Care during the described time interval was provided by me. I have reviewed this patient's available data, including medical history, events of note, physical examination and test results as part of my evaluation.   Lines/tubes : Airway 7.5 mm (Active)  Secured at (cm) 24 cm 05/06/19 0315  Measured From Lips 05/06/19 0315  Secured Location Right 05/06/19 0315  Secured By [employer] 05/06/19 0315  Tube Holder Repositioned Yes 05/06/19 0315  Cuff Pressure (cm H2O) 30 cm H2O 05/05/19 1931  Site Condition Dry 05/06/19 0315     Arterial Line 05/04/19 Left (Active)  Site Assessment Clean;Dry;Intact 05/05/19 2000  Line Status Pulsatile blood flow;Positional 05/05/19 2000  Art Line Waveform Appropriate 05/05/19 2000  Art Line Interventions Connections checked and tightened 05/05/19 2000  Color/Movement/Sensation Capillary refill less than 3 sec 05/05/19 2000  Dressing Type Transparent;Occlusive 05/05/19 2000  Dressing Status Clean;Dry;Intact 05/05/19 2000  Dressing Change Due 05/08/19 05/05/19 2000     Closed System Drain 1 Posterior Back Bulb (JP) (Active)  Site Description Unable to view 05/05/19 2000  Dressing Status Clean;Dry;Intact 05/05/19 2000  Drainage Appearance Bloody 05/05/19 2000  Status To suction (Charged) 05/05/19 2000  Output (mL) 60 mL 05/06/19 0700     Urethral Catheter Joylene Grapes.- EMT Temperature probe;Straight-tip 16 Fr. (Active)  Indication for Insertion or Continuance of Catheter Unstable spinal/crush injuries / Multisystem Trauma 05/05/19 2000  Site Assessment Clean;Intact 05/05/19 2000  Catheter Maintenance Bag below level of bladder;Catheter secured;Drainage bag/tubing not  touching floor;Insertion date on drainage bag;No dependent loops;Seal intact 05/05/19 2000  Collection Container Standard drainage bag 05/05/19 2000  Securement Method Securing device (Describe) 05/05/19 2000  Urinary Catheter Interventions (if applicable) Unclamped 85/27/78 2000  Output (mL) 100 mL 05/06/19 0600    Microbiology/Sepsis markers: Results for orders placed or performed during the hospital encounter of 05/04/19  Respiratory Panel by RT PCR (Flu A&B, Covid) - Nasopharyngeal Swab     Status: None   Collection Time: 05/04/19  5:14 PM   Specimen: Nasopharyngeal Swab  Result Value Ref Range Status   SARS Coronavirus  2 by RT PCR NEGATIVE NEGATIVE Final    Comment: (NOTE) SARS-CoV-2 target nucleic acids are NOT DETECTED. The SARS-CoV-2 RNA is generally detectable in upper respiratoy specimens during the acute phase of infection. The lowest concentration of SARS-CoV-2 viral copies this assay can detect is 131 copies/mL. A negative result does not preclude SARS-Cov-2 infection and should not be used as the sole basis for treatment or other patient management decisions. A negative result may occur with  improper specimen collection/handling, submission of specimen other than nasopharyngeal swab, presence of viral mutation(s) within the areas targeted by this assay, and inadequate number of viral copies (<131 copies/mL). A negative result must be combined with clinical observations, patient history, and epidemiological information. The expected result is Negative. Fact Sheet for Patients:  https://www.moore.com/ Fact Sheet for Healthcare Providers:  https://www.young.biz/ This test is not yet ap proved or cleared by the [place] FDA and  has been authorized for detection and/or diagnosis of SARS-CoV-2 by FDA under an Emergency Use Authorization (EUA). This EUA will remain  in effect (meaning this test can be used) for the duration of  the COVID-19 declaration under Section 564(b)(1) of the Act, 21 U.S.C. section 360bbb-3(b)(1), unless the authorization is terminated or revoked sooner.    Influenza A by PCR NEGATIVE NEGATIVE Final   Influenza B by PCR NEGATIVE NEGATIVE Final    Comment: (NOTE) The Xpert Xpress SARS-CoV-2/FLU/RSV assay is intended as an aid in  the diagnosis of influenza from Nasopharyngeal swab specimens and  should not be used as a sole basis for treatment. Nasal washings and  aspirates are unacceptable for Xpert Xpress SARS-CoV-2/FLU/RSV  testing. Fact Sheet for Patients: https://www.moore.com/ Fact Sheet for Healthcare Providers: https://www.young.biz/ This test is not yet approved or cleared by the [place] FDA and  has been authorized for detection and/or diagnosis of SARS-CoV-2 by  FDA under an Emergency Use Authorization (EUA). This EUA will remain  in effect (meaning this test can be used) for the duration of the  Covid-19 declaration under Section 564(b)(1) of the Act, 21  U.S.C. section 360bbb-3(b)(1), unless the authorization is  terminated or revoked. Performed at [facility], [address], [place], [place] [address]   MRSA PCR Screening     Status: Abnormal   Collection Time: 05/05/19  1:08 AM   Specimen: Nasal Mucosa; Nasopharyngeal  Result Value Ref Range Status   MRSA by PCR POSITIVE (A) NEGATIVE Final    Comment: CRITICAL RESULT CALLED TO, READ BACK BY AND VERIFIED WITH: RN TJacqulyn Liner [phone] @0234  THANEY Performed at [facility], [address] [place], [place]

## 2019-05-09 ENCOUNTER — Encounter (HOSPITAL_COMMUNITY): Payer: Self-pay

## 2019-05-09 ENCOUNTER — Inpatient Hospital Stay (HOSPITAL_COMMUNITY): Payer: Medicaid Other

## 2019-05-09 DIAGNOSIS — G8220 Paraplegia, unspecified: Secondary | ICD-10-CM

## 2019-05-09 LAB — COMPREHENSIVE METABOLIC PANEL
ALT: 44 U/L (ref 0–44)
AST: 71 U/L — ABNORMAL HIGH (ref 15–41)
Albumin: 2.7 g/dL — ABNORMAL LOW (ref 3.5–5.0)
Alkaline Phosphatase: 36 U/L — ABNORMAL LOW (ref 38–126)
Anion gap: 11 (ref 5–15)
BUN: 16 mg/dL (ref 6–20)
CO2: 23 mmol/L (ref 22–32)
Calcium: 8.1 mg/dL — ABNORMAL LOW (ref 8.9–10.3)
Chloride: 104 mmol/L (ref 98–111)
Creatinine, Ser: 1.13 mg/dL (ref 0.61–1.24)
GFR calc Af Amer: >60 mL/min (ref >60)
GFR calc non Af Amer: >60 mL/min (ref >60)
Glucose, Bld: 114 mg/dL — ABNORMAL HIGH (ref 70–99)
Potassium: 3.3 mmol/L — ABNORMAL LOW (ref 3.5–5.1)
Sodium: 138 mmol/L (ref 135–145)
Total Bilirubin: 1 mg/dL (ref 0.3–1.2)
Total Protein: 5.9 g/dL — ABNORMAL LOW (ref 6.5–8.1)

## 2019-05-09 LAB — CBC
HCT: 24.2 % — ABNORMAL LOW (ref 39.0–52.0)
Hemoglobin: 8.1 g/dL — ABNORMAL LOW (ref 13.0–17.0)
MCH: 30.7 pg (ref 26.0–34.0)
MCHC: 33.5 g/dL (ref 30.0–36.0)
MCV: 91.7 fL (ref 80.0–100.0)
Platelets: 186 10*3/uL (ref 150–400)
RBC: 2.64 MIL/uL — ABNORMAL LOW (ref 4.22–5.81)
RDW: 13.2 % (ref 11.5–15.5)
WBC: 9.9 10*3/uL (ref 4.0–10.5)
nRBC: 0.2 % (ref 0.0–0.2)

## 2019-05-09 LAB — URINALYSIS, ROUTINE W REFLEX MICROSCOPIC
Bilirubin Urine: NEGATIVE
Glucose, UA: NEGATIVE mg/dL
Ketones, ur: NEGATIVE mg/dL
Leukocytes,Ua: NEGATIVE
Nitrite: NEGATIVE
Protein, ur: NEGATIVE mg/dL
Specific Gravity, Urine: 1.009 (ref 1.005–1.030)
pH: 5 (ref 5.0–8.0)

## 2019-05-09 LAB — PHOSPHORUS: Phosphorus: 3.4 mg/dL (ref 2.5–4.6)

## 2019-05-09 LAB — BASIC METABOLIC PANEL
Anion gap: 11 (ref 5–15)
BUN: 13 mg/dL (ref 6–20)
CO2: 22 mmol/L (ref 22–32)
Calcium: 8 mg/dL — ABNORMAL LOW (ref 8.9–10.3)
Chloride: 109 mmol/L (ref 98–111)
Creatinine, Ser: 1.05 mg/dL (ref 0.61–1.24)
GFR calc Af Amer: >60 mL/min (ref >60)
GFR calc non Af Amer: >60 mL/min (ref >60)
Glucose, Bld: 111 mg/dL — ABNORMAL HIGH (ref 70–99)
Potassium: 3.9 mmol/L (ref 3.5–5.1)
Sodium: 142 mmol/L (ref 135–145)

## 2019-05-09 LAB — MAGNESIUM: Magnesium: 1.9 mg/dL (ref 1.7–2.4)

## 2019-05-09 MED ORDER — METHOCARBAMOL 500 MG PO TABS: 1000.0000 mg | ORAL_TABLET | Freq: Three times a day (TID) | ORAL | Status: DC

## 2019-05-09 MED ORDER — FOLIC ACID 1 MG PO TABS
1.0000 mg | ORAL_TABLET | Freq: Every day | ORAL | Status: DC
  Administered 2019-05-10 – 2019-05-11 (×2): 1 mg via ORAL
  Filled 2019-05-09 (×2): qty 1

## 2019-05-09 MED ORDER — ENOXAPARIN SODIUM 40 MG/0.4ML ~~LOC~~ SOLN
40.0000 mg | Freq: Two times a day (BID) | SUBCUTANEOUS | Status: DC
  Administered 2019-05-09 – 2019-05-11 (×5): 40 mg via SUBCUTANEOUS
  Filled 2019-05-09 (×5): qty 0.4

## 2019-05-09 MED ORDER — LORAZEPAM 2 MG/ML IJ SOLN: 1.0000 mg | INTRAMUSCULAR | Status: DC | PRN

## 2019-05-09 MED ORDER — BISACODYL 10 MG RE SUPP
10.0000 mg | Freq: Once | RECTAL | Status: DC
  Filled 2019-05-09: qty 1

## 2019-05-09 MED ORDER — ACETAMINOPHEN 500 MG PO TABS
1000.0000 mg | ORAL_TABLET | Freq: Three times a day (TID) | ORAL | Status: DC
  Administered 2019-05-09 – 2019-05-11 (×7): 1000 mg via ORAL
  Filled 2019-05-09 (×7): qty 2

## 2019-05-09 MED ORDER — GUAIFENESIN ER 600 MG PO TB12
600.0000 mg | ORAL_TABLET | Freq: Two times a day (BID) | ORAL | Status: DC
  Administered 2019-05-09 – 2019-05-11 (×5): 600 mg via ORAL
  Filled 2019-05-09 (×5): qty 1

## 2019-05-09 MED ORDER — BACLOFEN 10 MG PO TABS
5.0000 mg | ORAL_TABLET | Freq: Three times a day (TID) | ORAL | Status: DC
  Administered 2019-05-09 – 2019-05-11 (×7): 5 mg via ORAL
  Filled 2019-05-09 (×7): qty 1

## 2019-05-09 MED ORDER — THIAMINE HCL 100 MG PO TABS
100.0000 mg | ORAL_TABLET | Freq: Every day | ORAL | Status: DC
  Administered 2019-05-09 – 2019-05-11 (×3): 100 mg via ORAL
  Filled 2019-05-09 (×3): qty 1

## 2019-05-09 MED ORDER — FUROSEMIDE 10 MG/ML IJ SOLN
40.0000 mg | Freq: Once | INTRAMUSCULAR | Status: AC
  Administered 2019-05-09: 40 mg via INTRAVENOUS
  Filled 2019-05-09: qty 4

## 2019-05-09 MED ORDER — ADULT MULTIVITAMIN W/MINERALS CH
1.0000 | ORAL_TABLET | Freq: Every day | ORAL | Status: DC
  Administered 2019-05-10 – 2019-05-11 (×2): 1 via ORAL
  Filled 2019-05-09 (×3): qty 1

## 2019-05-09 MED ORDER — POLYETHYLENE GLYCOL 3350 17 G PO PACK
17.0000 g | PACK | Freq: Every day | ORAL | Status: DC
  Administered 2019-05-09 – 2019-05-10 (×2): 17 g via ORAL
  Filled 2019-05-09: qty 1

## 2019-05-09 MED ORDER — OXYCODONE HCL 5 MG PO TABS
5.0000 mg | ORAL_TABLET | ORAL | Status: DC | PRN
  Administered 2019-05-09 – 2019-05-11 (×13): 10 mg via ORAL
  Filled 2019-05-09 (×12): qty 2

## 2019-05-09 MED ORDER — GABAPENTIN 600 MG PO TABS
300.0000 mg | ORAL_TABLET | Freq: Three times a day (TID) | ORAL | Status: DC
  Administered 2019-05-09: 300 mg via ORAL
  Filled 2019-05-09: qty 1

## 2019-05-09 MED ORDER — METHOCARBAMOL 500 MG PO TABS
500.0000 mg | ORAL_TABLET | Freq: Three times a day (TID) | ORAL | Status: DC
  Administered 2019-05-09: 500 mg via ORAL
  Filled 2019-05-09: qty 1

## 2019-05-09 MED ORDER — SODIUM CHLORIDE 0.9 % IV SOLN: 250.0000 mL | INTRAVENOUS | Status: DC | PRN

## 2019-05-09 MED ORDER — LORAZEPAM 1 MG PO TABS: 1.0000 mg | ORAL_TABLET | ORAL | Status: DC | PRN

## 2019-05-09 MED ORDER — SODIUM CHLORIDE 0.9% FLUSH
3.0000 mL | INTRAVENOUS | Status: DC | PRN
  Administered 2019-05-10: 3 mL via INTRAVENOUS

## 2019-05-09 MED ORDER — GABAPENTIN 300 MG PO CAPS
300.0000 mg | ORAL_CAPSULE | Freq: Three times a day (TID) | ORAL | Status: DC
  Administered 2019-05-09 – 2019-05-11 (×6): 300 mg via ORAL
  Filled 2019-05-09 (×6): qty 1

## 2019-05-09 MED ORDER — HYDROMORPHONE HCL 1 MG/ML IJ SOLN
0.5000 mg | Freq: Four times a day (QID) | INTRAMUSCULAR | Status: DC | PRN
  Administered 2019-05-09: 0.5 mg via INTRAVENOUS
  Filled 2019-05-09: qty 1

## 2019-05-09 MED ORDER — SODIUM CHLORIDE 0.9% FLUSH
3.0000 mL | Freq: Two times a day (BID) | INTRAVENOUS | Status: DC
  Administered 2019-05-09 – 2019-05-11 (×5): 3 mL via INTRAVENOUS

## 2019-05-09 MED ORDER — HYDROMORPHONE HCL 1 MG/ML IJ SOLN
0.2500 mg | Freq: Four times a day (QID) | INTRAMUSCULAR | Status: DC | PRN
  Administered 2019-05-09: 0.25 mg via INTRAVENOUS
  Filled 2019-05-09 (×2): qty 1

## 2019-05-09 NOTE — Progress Notes (Addendum)
[facility] Progress Note  5 Days Post-Op  Subjective: Patient's main complaint is back pain. He was asking for breakthrough pain medication specifically per nursing staff. We discussed that the oral pain medication will provide longer lasting pain relief and to maximize this prior to IV pain medication today. Patient denies SOB but does report some cough that is sometimes productive but he feels like phlegm is getting stuck at times. He reports he is drinking fluids well but not eating much, low appetite. Denies nausea, has not had a BM in several days. Feels like extremities are a little swollen.   Review of Systems  Constitutional: Positive for fever (low grade). Negative for chills.  Respiratory: Positive for cough. Negative for shortness of breath and wheezing.   Cardiovascular: Negative for chest pain and palpitations.  Gastrointestinal: Positive for constipation. Negative for abdominal pain, nausea and vomiting.  Musculoskeletal: Positive for back pain and neck pain.     Objective: Vital signs in last 24 hours: Temp:  [98.4 F (36.9 C)-100.8 F (38.2 C)] 100.8 F (38.2 C) (03/15 0745) Pulse Rate:  [58-96] 84 (03/15 0745) Resp:  [14-23] 14 (03/15 0745) BP: (109-141)/(58-80) 135/77 (03/15 0745) SpO2:  [97 %-100 %] 99 % (03/15 0745) Last BM Date: (PTA)  Intake/Output from previous day: 03/14 0701 - 03/15 0700 In: 2294 [P.O.:1080; I.V.:1214] Out: 1765 [Urine:1130; Drains:635] Intake/Output this shift: Total I/O In: -  Out: 100 [Urine:100]  PE: General: pleasant, WD, male who is sitting in bed in NAD HEENT: abrasions to forehead well healing without signs of infection.  PERRL.  Ears and nose without any masses or lesions.  Mouth is pink and moist Neck: Cervical collar present Heart: regular, rate, and rhythm.  Normal s1,s2. No obvious murmurs, gallops, or rubs noted.  Palpable radial and pedal pulses bilaterally Lungs: CTAB, no wheezes, rhonchi, or rales noted.   Respiratory effort nonlabored Abd: soft, NT, ND, +BS, no masses, hernias, or organomegaly MS: BL LEs edematous, BL UEs with trace edema, BL rhomboids tense but no active spasm, drain present with SS drainage Skin: warm and dry with no masses, lesions, or rashes Neuro: Cranial nerves 2-12 grossly intact, no sensation below the level of nipples, good motor function and strength in BL UEs Psych: A&Ox3 with an appropriate affect.   Lab Results:  Recent Labs    05/08/19 0152 05/09/19 0611  WBC 10.7* 9.9  HGB 7.8* 8.1*  HCT 22.9* 24.2*  PLT 135* 186   BMET Recent Labs    05/08/19 0152 05/09/19 0611  NA 139 142  K 3.8 3.9  CL 112* 109  CO2 21* 22  GLUCOSE 116* 111*  BUN 11 13  CREATININE 1.09 1.05  CALCIUM 7.9* 8.0*   PT/INR No results for input(s): LABPROT, INR in the last 72 hours. CMP     Component Value Date/Time   NA 142 05/09/2019 0611   K 3.9 05/09/2019 0611   CL 109 05/09/2019 0611   CO2 22 05/09/2019 0611   GLUCOSE 111 (H) 05/09/2019 0611   BUN 13 05/09/2019 0611   CREATININE 1.05 05/09/2019 0611   CALCIUM 8.0 (L) 05/09/2019 0611   PROT 5.1 (L) 05/05/2019 0104   ALBUMIN 3.5 05/05/2019 0104   AST 44 (H) 05/05/2019 0104   ALT 20 05/05/2019 0104   ALKPHOS 19 (L) 05/05/2019 0104   BILITOT 1.1 05/05/2019 0104   GFRNONAA >60 05/09/2019 0611   GFRAA >60 05/09/2019 0611   Lipase  No results found for: LIPASE  Studies/Results: No results found.  Anti-infectives: Anti-infectives (From admission, onward)   Start     Dose/Rate Route Frequency Ordered Stop   05/05/19 0800  ceFAZolin (ANCEF) IVPB 2g/100 mL premix     2 g 200 mL/hr over 30 Minutes Intravenous Every 8 hours 05/05/19 0102 05/05/19 0805   05/04/19 2112  bacitracin 50,000 Units in sodium chloride 0.9 % 500 mL irrigation  Status:  Discontinued       As needed 05/04/19 2112 05/05/19 0046   05/04/19 1945  ceFAZolin (ANCEF) IVPB 2g/100 mL premix     2 g 200 mL/hr over 30 Minutes Intravenous   Once 05/04/19 1931 05/05/19 0203       Assessment/Plan [facility] TBI/subgaleal hematoma - per Dr. Zada Finders. F/C. R orbit FX - per Dr. Janace Hoard Occipital condyle, C1 and C7 FXs - collar per Dr. Zada Finders Complex T3 and T4 FXs - SCI likely complete, S/P fusion T1-6 by Dr. Zada Finders 3/11 FX R 2nd and L 3rd ribs AKI - CR stable ABL anemia- stable Acute hypoxic ventilator dependent respiratory failure - resolved ETOH abuse - watch for WD, will try to assess use Neurogenic urinary retention - foley Fever - low grade 100.8, CXR and UA, WBC normal  Sinus pause - episodes overnight, some brady, asymptomatic, continue to monitor   FEN - soft diet, SLIV VTE - lovenox ID - Ancef 3/10-3/11 Pain - added scheduled tylenol and robaxin today, encourage PO pain medication over IV  Dispo - CXR and UA for low grade fever. Encourage IS. Pain control. Possible CIR?  LOS: 5 days    Brigid Re , [facility] Surgery 05/09/2019, 8:25 AM Please see Amion for pager number during day hours 7:00am-4:30pm

## 2019-05-09 NOTE — Plan of Care (Signed)
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
## 2019-05-09 NOTE — Progress Notes (Signed)
Neurosurgery Service Progress Note  Subjective: No acute events overnight  Objective: Vitals:   05/08/19 2342 05/09/19 0318 05/09/19 0514 05/09/19 0745  BP:    135/77  Pulse:   73 84  Resp:    14  Temp: 99.9 F (37.7 C) 98.4 F (36.9 C)  (!) 100.8 F (38.2 C)  TempSrc:    Axillary  SpO2:   100% 99%  Weight:      Height:       Temp (24hrs), Avg:99.7 F (37.6 C), Min:98.4 F (36.9 C), Max:100.8 F (38.2 C)  CBC Latest Ref Rng & Units 05/08/2019 05/07/2019 05/06/2019  WBC 4.0 - 10.5 K/uL 10.7(H) 11.7(H) 11.9(H)  Hemoglobin 13.0 - 17.0 g/dL 7.8(L) 7.7(L) 8.3(L)  Hematocrit 39.0 - 52.0 % 22.9(L) 23.2(L) 25.4(L)  Platelets 150 - 400 K/uL 135(L) 111(L) 120(L)   BMP Latest Ref Rng & Units 05/08/2019 05/07/2019 05/06/2019  Glucose 70 - 99 mg/dL 116(H) 102(H) 109(H)  BUN 6 - 20 mg/dL 11 8 9   Creatinine 0.61 - 1.24 mg/dL 1.09 1.03 0.94  Sodium 135 - 145 mmol/L 139 141 143  Potassium 3.5 - 5.1 mmol/L 3.8 3.8 4.3  Chloride 98 - 111 mmol/L 112(H) 112(H) 112(H)  CO2 22 - 32 mmol/L 21(L) 22 24  Calcium 8.9 - 10.3 mg/dL 7.9(L) 7.7(L) 7.4(L)    Intake/Output Summary (Last 24 hours) at 05/09/2019 0748 Last data filed at 05/09/2019 0746 Gross per 24 hour  Intake 2294.03 ml  Output 1865 ml  Net 429.03 ml    Current Facility-Administered Medications:  .  0.9 % NaCl with KCl 20 mEq/ L  infusion, , Intravenous, Continuous, Romana Juniper A, MD, Last Rate: 50 mL/hr at 05/08/19 2209, Restarted at 05/08/19 2209 .  acetaminophen (TYLENOL) tablet 650 mg, 650 mg, Oral, Q4H PRN, Stark Klein, MD, 650 mg at 05/09/19 0203 .  bisacodyl (DULCOLAX) suppository 10 mg, 10 mg, Rectal, Daily PRN, Georganna Skeans, MD .  chlorhexidine gluconate (MEDLINE KIT) (PERIDEX) 0.12 % solution 15 mL, 15 mL, Mouth Rinse, BID, Stark Klein, MD, 15 mL at 05/08/19 0800 .  Chlorhexidine Gluconate Cloth 2 % PADS 6 each, 6 each, Topical, Daily, Stark Klein, MD, 6 each at 05/08/19 0915 .  docusate sodium (COLACE) capsule  100 mg, 100 mg, Oral, BID, Stark Klein, MD, 100 mg at 05/08/19 2209 .  HYDROmorphone (DILAUDID) injection 0.5 mg, 0.5 mg, Intravenous, Q4H PRN, Romana Juniper A, MD, 0.5 mg at 05/09/19 0515 .  ipratropium-albuterol (DUONEB) 0.5-2.5 (3) MG/3ML nebulizer solution 3 mL, 3 mL, Nebulization, Q4H PRN, Georganna Skeans, MD .  mupirocin ointment (BACTROBAN) 2 % 1 application, 1 application, Nasal, BID, Stark Klein, MD, 1 application at 67/61/95 2209 .  ondansetron (ZOFRAN-ODT) disintegrating tablet 4 mg, 4 mg, Oral, Q6H PRN **OR** ondansetron (ZOFRAN) injection 4 mg, 4 mg, Intravenous, Q6H PRN, Stark Klein, MD .  oxyCODONE (Oxy IR/ROXICODONE) immediate release tablet 5-10 mg, 5-10 mg, Oral, Q4H PRN, Stark Klein, MD, 10 mg at 05/09/19 0203 .  pantoprazole (PROTONIX) EC tablet 40 mg, 40 mg, Oral, Daily, 40 mg at 05/08/19 0907 **OR** pantoprazole (PROTONIX) injection 40 mg, 40 mg, Intravenous, Daily, Stark Klein, MD, 40 mg at 05/06/19 1100 .  polyethylene glycol (MIRALAX / GLYCOLAX) packet 17 g, 17 g, Per Tube, Daily, Georganna Skeans, MD, 17 g at 05/08/19 0909 .  thiamine (B-1) injection 100 mg, 100 mg, Intravenous, Daily, Stark Klein, MD, 100 mg at 05/08/19 0908   Physical Exam: Awake/alert, FC in BUE, 0/5 in BLE Drain w/  serosang output  Assessment & Plan: 34 y.o. man s/p [facility] w/ T5 ASIA A 2/2 severe T3/T4 fracture dislocation, also w/ C7 facet frx, C1 frx, occipital condyle frx.  -continue rigid C-collar for cervical / O-1 fractures -drain clearing up, likely CSF given his nerve root avulsions, could also have ventral durotomy that wasn't visible intra-op, will remove drain later this morning -okay for DVT chemoprophylaxis  Judith Part  05/09/19 7:48 AM

## 2019-05-09 NOTE — Progress Notes (Signed)
8864: Notified on-call provider regarding 2.04s sinus pause. Pt asymptomatic. No new orders received. Will continue to monitor.   0543: Pt's mother called asking for an update. Answered all questions.

## 2019-05-09 NOTE — Progress Notes (Signed)
Physical Therapy Treatment Patient Details Name: [name] Ford MRN: [id number] DOB: [birth year] Today's Date: 05/09/2019    History of Present Illness 35 y.o. man s/p [facility] w/ T5 ASIA A 2/2 severe T3/T4 fracture dislocation, also w/ C7 facet frx, C1 frx, occipital condyle frx.  Head Ct showing Right frontoparietal vertex subgaleal hematoma. 05/05/19 T3, T4 laminectomies with decompression of spinal cord, transpedicular reduction of fracture; T1-T6 posterior instrumented fusion; No significant past medical history.    PT Comments    Patient resistant to working with PT while his pain meds are making him sleepy. Girlfriend present and helped explain to pt why he must work with PT when his meds are working well. Ultimately patient participated with moving bed into chair-like position and working on using his UEs to pull his torso upright into unsupported sitting. Educated pt/girlfriend in goal next session to sit at EOB.    Follow Up Recommendations  CIR     Equipment Recommendations  Wheelchair cushion (measurements PT);Wheelchair (measurements PT)    Recommendations for Other Services       Precautions / Restrictions Precautions Precautions: Fall;Cervical Precaution Comments: Reviewed cervical precautions and proper wearing of Aspen (pt observed to "pop" his chin up out of brace and perform AROM cervical lateral flexion.  Required Braces or Orthoses: Cervical Brace Cervical Brace: Hard collar;At all times(collar tightened) Restrictions Weight Bearing Restrictions: No    Mobility  Bed Mobility Overal bed mobility: Needs Assistance             General bed mobility comments: gradually used chair position in bed to achieve upright position; MAP 68 (at 45 degrees), 65 (at 62 degrees) , 60 (at 64 degrees with feet lowered--using SCDs  Transfers                    Ambulation/Gait                 Stairs             Wheelchair Mobility    Modified Rankin  (Stroke Patients Only)       Balance Overall balance assessment: Needs assistance Sitting-balance support: Bilateral upper extremity supported(in long-sitting, using bed rails) Sitting balance-Leahy Scale: Poor                                      Cognition Arousal/Alertness: Lethargic;Suspect due to medications Behavior During Therapy: Flat affect Overall Cognitive Status: Impaired/Different from baseline Area of Impairment: Attention;Following commands;Safety/judgement;Awareness;Problem solving                   Current Attention Level: Sustained   Following Commands: Follows one step commands with increased time;Follows one step commands consistently Safety/Judgement: Decreased awareness of deficits;Decreased awareness of safety Awareness: Intellectual Problem Solving: Slow processing;Difficulty sequencing;Decreased initiation;Requires verbal cues;Requires tactile cues General Comments: Very sleepy from pain meds and requires max cues to stay awake      Exercises Other Exercises Other Exercises: AAROM progressing to AROM: pt using bil UEs to pull torso from supported sitting to unsupported sitting (HOB 64degrees and using bil bed rails x 5 reps)    General Comments General comments (skin integrity, edema, etc.): Pt repeatedly stating he wants to sleep while his pain medicine is working. Gradually began to understand importance of therapy while his pain meds are working.       Pertinent Vitals/Pain Pain Assessment: Faces Faces Pain Scale: Hurts  a little bit Pain Location: Back of neck Pain Descriptors / Indicators: Discomfort(falling asleep throoughout session) Pain Intervention(s): Limited activity within patient's tolerance;Monitored during session;Premedicated before session    Home Living                      Prior Function            PT Goals (current goals can now be found in the care plan section) Acute Rehab PT Goals Patient  Stated Goal: "Want the best rehab" - wife Time For Goal Achievement: 05/20/19 Potential to Achieve Goals: Fair Progress towards PT goals: Progressing toward goals    Frequency    Min 4X/week      PT Plan Current plan remains appropriate    Co-evaluation              AM-PAC PT "6 Clicks" Mobility   Outcome Measure  Help needed turning from your back to your side while in a flat bed without using bedrails?: Total Help needed moving from lying on your back to sitting on the side of a flat bed without using bedrails?: Total Help needed moving to and from a bed to a chair (including a wheelchair)?: Total Help needed standing up from a chair using your arms (e.g., wheelchair or bedside chair)?: Total Help needed to walk in hospital room?: Total Help needed climbing 3-5 steps with a railing? : Total 6 Click Score: 6    End of Session Equipment Utilized During Treatment: Cervical collar Activity Tolerance: Patient limited by pain;Patient limited by lethargy Patient left: in bed;with call bell/phone within reach;with family/visitor present;with bed alarm set   PT Visit Diagnosis: Other abnormalities of gait and mobility (R26.89);Other (comment);Pain(Paraplegia) Pain - part of body: (cervical)     Time: 9379-0240 PT Time Calculation (min) (ACUTE ONLY): 28 min  Charges:  $Therapeutic Activity: 23-37 mins                      Arby Barrette, PT Pager [phone]    Rexanne Mano 05/09/2019, 5:44 PM

## 2019-05-09 NOTE — Progress Notes (Signed)
Inpatient Rehabilitation Admissions Coordinator   I met with patient with his significant other, Brandi at bedside. Patient just received pain med so sleeping throughout. We discussed rehab goals and expectations with wheelchair level of care at d/c. I will contact financial counselor for assist with disability and medicaid and follow up tomorrow.  Danne Baxter, RN, MSN Rehab Admissions Coordinator [phone] 05/09/2019 3:55 PM

## 2019-05-10 LAB — CBC
HCT: 25.1 % — ABNORMAL LOW (ref 39.0–52.0)
Hemoglobin: 8.3 g/dL — ABNORMAL LOW (ref 13.0–17.0)
MCH: 31.3 pg (ref 26.0–34.0)
MCHC: 33.1 g/dL (ref 30.0–36.0)
MCV: 94.7 fL (ref 80.0–100.0)
Platelets: 221 10*3/uL (ref 150–400)
RBC: 2.65 MIL/uL — ABNORMAL LOW (ref 4.22–5.81)
RDW: 13.6 % (ref 11.5–15.5)
WBC: 10.4 10*3/uL (ref 4.0–10.5)
nRBC: 0.2 % (ref 0.0–0.2)

## 2019-05-10 LAB — URINE CULTURE: Culture: NO GROWTH

## 2019-05-10 LAB — BASIC METABOLIC PANEL
Anion gap: 9 (ref 5–15)
BUN: 18 mg/dL (ref 6–20)
CO2: 25 mmol/L (ref 22–32)
Calcium: 8 mg/dL — ABNORMAL LOW (ref 8.9–10.3)
Chloride: 107 mmol/L (ref 98–111)
Creatinine, Ser: 1.33 mg/dL — ABNORMAL HIGH (ref 0.61–1.24)
GFR calc Af Amer: >60 mL/min (ref >60)
GFR calc non Af Amer: >60 mL/min (ref >60)
Glucose, Bld: 117 mg/dL — ABNORMAL HIGH (ref 70–99)
Potassium: 3.4 mmol/L — ABNORMAL LOW (ref 3.5–5.1)
Sodium: 141 mmol/L (ref 135–145)

## 2019-05-10 MED ORDER — KETOROLAC TROMETHAMINE 30 MG/ML IJ SOLN: 30.0000 mg | Freq: Three times a day (TID) | INTRAMUSCULAR | Status: DC

## 2019-05-10 MED ORDER — KETOROLAC TROMETHAMINE 30 MG/ML IJ SOLN
30.0000 mg | Freq: Three times a day (TID) | INTRAMUSCULAR | Status: DC | PRN
  Administered 2019-05-10 – 2019-05-11 (×3): 30 mg via INTRAVENOUS
  Filled 2019-05-10 (×3): qty 1

## 2019-05-10 MED ORDER — POLYETHYLENE GLYCOL 3350 17 G PO PACK
17.0000 g | PACK | Freq: Two times a day (BID) | ORAL | Status: DC
  Administered 2019-05-10 – 2019-05-11 (×2): 17 g via ORAL
  Filled 2019-05-10 (×2): qty 1

## 2019-05-10 NOTE — Progress Notes (Signed)
Physical Therapy Treatment Patient Details Name: [name] Ford MRN: [id number] DOB: 05-05-1985 Today's Date: 05/10/2019    History of Present Illness 34 y.o. man s/p [facility] w/ T5 ASIA A 2/2 severe T3/T4 fracture dislocation, also w/ C7 facet frx, C1 frx, occipital condyle frx.  Head Ct showing Right frontoparietal vertex subgaleal hematoma. 05/05/19 T3, T4 laminectomies with decompression of spinal cord, transpedicular reduction of fracture; T1-T6 posterior instrumented fusion; No significant past medical history.    PT Comments    Patient much more alert and cooperative during session. Able to tolerate sitting EOB with bil LEs wrapped with 2 ace wraps with 2 person assist for balance. Pt able to correct his imbalance if leans left or right, however had tendency to lose balance posteriorly and cannot self correct as easily (needs up to max assist). After 7 minutes, pt reporting light headedness and BP had decreased. Assisted back to bed with symptoms improving.   Supine with HOB elevated 117/64  Sitting at EOB 116/73  Sitting at EOB after 7 minutes 107/55     Follow Up Recommendations  CIR     Equipment Recommendations  Wheelchair cushion (measurements PT);Wheelchair (measurements PT)    Recommendations for Other Services       Precautions / Restrictions Precautions Precautions: Fall;Cervical Precaution Comments: Pt reports he is supposed to get new pads for his c-collar. Educated he should be lying flat and roll for doffing brace (with nursing assist) Required Braces or Orthoses: Cervical Brace Cervical Brace: Hard collar;At all times Restrictions Weight Bearing Restrictions: No    Mobility  Bed Mobility Overal bed mobility: Needs Assistance Bed Mobility: Rolling;Sit to Sidelying;Supine to Sit Rolling: Max assist;+2 for physical assistance   Supine to sit: Total assist;+2 for physical assistance;+2 for safety/equipment;HOB elevated   Sit to sidelying: Max assist;+2 for  physical assistance;+2 for safety/equipment General bed mobility comments: From HOB elevated, assisted pt to pivot to sit at rt EOB and then mattress flattened to allow pt to work on sitting balance.   Transfers                    Ambulation/Gait                 Stairs             Wheelchair Mobility    Modified Rankin (Stroke Patients Only)       Balance Overall balance assessment: Needs assistance Sitting-balance support: Bilateral upper extremity supported;Feet supported Sitting balance-Leahy Scale: Poor Sitting balance - Comments: loses balance posteriorly; able to maintain midline with minguard for up to 15 seconds                                    Cognition Arousal/Alertness: Awake/alert Behavior During Therapy: WFL for tasks assessed/performed Overall Cognitive Status: Within Functional Limits for tasks assessed                                 General Comments: Asked "what would you say if I just got up and walked?"      Exercises Other Exercises Other Exercises: PROM bil ankles WNL; hip and knee flexion with rolling and sitting    General Comments General comments (skin integrity, edema, etc.): bil LEs wrapped with 2 ace wraps each leg (up to knee) for BP support. Pt denied dizziness x 5-6  minutes and then began to feel light-headed with BP decreased.       Pertinent Vitals/Pain Pain Assessment: Faces Faces Pain Scale: Hurts whole lot Pain Location: Back and neck Pain Descriptors / Indicators: Discomfort;Grimacing Pain Intervention(s): Limited activity within patient's tolerance;Monitored during session;Premedicated before session;Repositioned    Home Living                      Prior Function            PT Goals (current goals can now be found in the care plan section) Acute Rehab PT Goals Patient Stated Goal: "Want the best rehab" - wife Time For Goal Achievement: 05/20/19 Potential to  Achieve Goals: Fair Progress towards PT goals: Progressing toward goals    Frequency    Min 4X/week      PT Plan Current plan remains appropriate    Co-evaluation PT/OT/SLP Co-Evaluation/Treatment: Yes Reason for Co-Treatment: Complexity of the patient's impairments (multi-system involvement);For patient/therapist safety;To address functional/ADL transfers PT goals addressed during session: Mobility/safety with mobility;Balance;Strengthening/ROM        AM-PAC PT "6 Clicks" Mobility   Outcome Measure  Help needed turning from your back to your side while in a flat bed without using bedrails?: Total Help needed moving from lying on your back to sitting on the side of a flat bed without using bedrails?: Total Help needed moving to and from a bed to a chair (including a wheelchair)?: Total Help needed standing up from a chair using your arms (e.g., wheelchair or bedside chair)?: Total Help needed to walk in hospital room?: Total Help needed climbing 3-5 steps with a railing? : Total 6 Click Score: 6    End of Session Equipment Utilized During Treatment: Cervical collar Activity Tolerance: Patient limited by pain;Treatment limited secondary to medical complications (Comment)(decr BP) Patient left: in bed;with call bell/phone within reach;with family/visitor present Nurse Communication: Other (comment)(RN off unit; told NT about session) PT Visit Diagnosis: Other abnormalities of gait and mobility (R26.89);Other (comment);Pain(Paraplegia) Pain - part of body: (cervical)     Time: 8891-6945 PT Time Calculation (min) (ACUTE ONLY): 41 min  Charges:  $Neuromuscular Re-education: 23-37 mins                      Arby Barrette, PT Pager [phone]    Rexanne Mano 05/10/2019, 3:02 PM

## 2019-05-10 NOTE — Progress Notes (Addendum)
Occupational Therapy Treatment Patient Details Name: [name] Ford MRN: [id number] DOB: [birth year] Today's Date: 05/10/2019    History of present illness 34 y.o. man s/p [facility] w/ T5 ASIA A 2/2 severe T3/T4 fracture dislocation, also w/ C7 facet frx, C1 frx, occipital condyle frx.  Head Ct showing Right frontoparietal vertex subgaleal hematoma. 05/05/19 T3, T4 laminectomies with decompression of spinal cord, transpedicular reduction of fracture; T1-T6 posterior instrumented fusion; No significant past medical history.   OT comments  Pt progressing towards established OT goals. Demonstrating increased arousal and engagement this session; motivated to participate in therapy. Pt performing UB bathing with Min A and LB bathing with Mod A at bed level with HOB elevated. Pt requiring Mod-Max A +2 for bed mobility. Sitting at EOB with Min-Mod A for sitting balance tolerating ~7 minutes with ace wraps applied to BLEs. Challenging pt's sitting balance at EOB and focusing on increasing his awareness and correction of positional changes. Continue to recommend dc to CIR and will continue to follow acutely as admitted.   Orthostatic BPs   Supine with HOB elevated 117/64  Sitting at EOB 116/73  Sitting at EOB after 7 minutes 107/55     Follow Up Recommendations  CIR;Supervision/Assistance - 24 hour    Equipment Recommendations  Other (comment)(Defer to next venue)    Recommendations for Other Services PT consult;Rehab consult    Precautions / Restrictions Precautions Precautions: Fall;Cervical Precaution Comments: Pt reports he is supposed to get new pads for his c-collar. Educated he should be lying flat and roll for doffing brace (with nursing assist) Required Braces or Orthoses: Cervical Brace Cervical Brace: Hard collar;At all times Restrictions Weight Bearing Restrictions: No       Mobility Bed Mobility Overal bed mobility: Needs Assistance Bed Mobility: Rolling;Sit to Sidelying;Supine to  Sit Rolling: Max assist;+2 for physical assistance   Supine to sit: Total assist;+2 for physical assistance;+2 for safety/equipment;HOB elevated   Sit to sidelying: Max assist;+2 for physical assistance;+2 for safety/equipment General bed mobility comments: From HOB elevated, assisted pt to pivot to sit at rt EOB and then mattress flattened to allow pt to work on sitting balance.   Transfers                      Balance Overall balance assessment: Needs assistance Sitting-balance support: Bilateral upper extremity supported;Feet supported Sitting balance-Leahy Scale: Poor Sitting balance - Comments: loses balance posteriorly; able to maintain midline with minguard for up to 15 seconds                                   ADL either performed or assessed with clinical judgement   ADL Overall ADL's : Needs assistance/impaired     Grooming: Applying deodorant;Set up;Supervision/safety;Bed level(HOB elevated into sitting posture) Grooming Details (indicate cue type and reason): Pt applying deotorant while sitting upright in bed Upper Body Bathing: Minimal assistance;Bed level Upper Body Bathing Details (indicate cue type and reason): HOB elevated so pt able to wash his chest and axillary areas. Min A for washing his back in sidelying position Lower Body Bathing: Moderate assistance;Bed level Lower Body Bathing Details (indicate cue type and reason): Pt able to wash peri area with HOB eleavted in upright posture at bed level. Mod A for washing buttocks. in sidelying Upper Body Dressing : Minimal assistance;Bed level Upper Body Dressing Details (indicate cue type and reason): Donned new gown.  Lower Body  Dressing: Total assistance;Bed level Lower Body Dressing Details (indicate cue type and reason): Total A for donning ace wrap               General ADL Comments: Pt participating in UB and LB bathing at bed level. Pt then paritcipating in balance activity at EOB.  BP stable throughout.      Vision       Perception     Praxis      Cognition Arousal/Alertness: Awake/alert Behavior During Therapy: WFL for tasks assessed/performed Overall Cognitive Status: Impaired/Different from baseline Area of Impairment: Awareness;Problem solving;Memory                     Memory: Decreased short-term memory     Awareness: Emergent Problem Solving: Requires tactile cues;Requires verbal cues General Comments: Asked "what would you say if I just got up and walked?" Pt with decreased awareness of deficits and how these deficits impact functional performance and progress. Pt more engaged this session        Exercises Exercises: Other exercises Other Exercises Other Exercises: PROM bil ankles WNL; hip and knee flexion with rolling and sitting   Shoulder Instructions       General Comments bil LEs wrapped with 2 ace wraps each leg (up to knee) for BP support. Pt denied dizziness x 3-4 minutes and then began to feel light-headed with BP decreased.     Pertinent Vitals/ Pain       Pain Assessment: Faces Faces Pain Scale: Hurts whole lot Pain Location: Back and neck Pain Descriptors / Indicators: Discomfort;Grimacing Pain Intervention(s): Monitored during session;Limited activity within patient's tolerance;Repositioned  Home Living   Living Arrangements: (Lives with girlfriend, [name] 83 and 45 year old and 53 month) Available Help at Discharge: Family;Available 24 hours/day(between [name], his family and friends)         Home Layout: Two level;Bed/bath upstairs;1/2 bath on main level(aware need to arrange for him down stairs)           Bathroom Accessibility: No(full bath upstairs; only half bath downstairs)          Lives With: Spouse;Family(the 80 month old , Terrilyn Saver, is their daughter)    Prior Functioning/Environment              Frequency  Min 2X/week        Progress Toward Goals  OT Goals(current goals can now  be found in the care plan section)  Progress towards OT goals: Progressing toward goals  Acute Rehab OT Goals Patient Stated Goal: "Want the best rehab" - wife OT Goal Formulation: With patient/family Time For Goal Achievement: 05/20/19 Potential to Achieve Goals: Good ADL Goals Pt Will Perform Grooming: with set-up;with supervision;sitting;bed level Pt Will Perform Upper Body Bathing: with min assist;bed level Pt Will Transfer to Toilet: with mod assist;anterior/posterior transfer;with transfer board;bedside commode(drop arm) Additional ADL Goal #1: Pt will perform bed mobility with Mod A in preparation for ADLs Additional ADL Goal #2: Pt will tolerate sitting at EOB with Mod A for ~10 minutes in preparation for ADLs Additional ADL Goal #3: Pt will sustain attention to an ADL task with Min cues  Plan Discharge plan remains appropriate    Co-evaluation    PT/OT/SLP Co-Evaluation/Treatment: Yes Reason for Co-Treatment: For patient/therapist safety;To address functional/ADL transfers PT goals addressed during session: Mobility/safety with mobility;Balance;Strengthening/ROM OT goals addressed during session: ADL's and self-care      AM-PAC OT "6 Clicks" Daily Activity  Outcome Measure   Help from another person eating meals?: A Little Help from another person taking care of personal grooming?: A Little Help from another person toileting, which includes using toliet, bedpan, or urinal?: A Lot Help from another person bathing (including washing, rinsing, drying)?: A Lot Help from another person to put on and taking off regular upper body clothing?: A Little Help from another person to put on and taking off regular lower body clothing?: Total 6 Click Score: 14    End of Session Equipment Utilized During Treatment: Cervical collar  OT Visit Diagnosis: Unsteadiness on feet (R26.81);Other abnormalities of gait and mobility (R26.89);Muscle weakness (generalized) (M62.81)    Activity Tolerance Patient tolerated treatment well   Patient Left in bed;with call bell/phone within reach;with bed alarm set;with nursing/sitter in room   Nurse Communication Mobility status        Time: 6270-3500 OT Time Calculation (min): 42 min  Charges: OT General Charges $OT Visit: 1 Visit OT Treatments $Self Care/Home Management : 8-22 mins  Kasi Lasky MSOT, OTR/L Acute Rehab Pager: [phone] Office: [phone]   Theodoro Grist Jet Armbrust 05/10/2019, 6:08 PM

## 2019-05-10 NOTE — Progress Notes (Signed)
Patient ID: [name] Ford, male   DOB: 08/07/1985, 34 y.o.   MRN: [id number]    6 Days Post-Op  Subjective: No new complaints this morning.  Still with pain, but on a lot of different medications to help control his pain.  He once again asks about his prognosis and seems surprised when we discussed his diagnosis of paraplegia.  He has been told by Dr. Janee Morn and Dr. Bedelia Person before my visit today.  He is eating well.  ROS: See above, otherwise other systems negative  Objective: Vital signs in last 24 hours: Temp:  [97.5 F (36.4 C)-100 F (37.8 C)] 99.4 F (37.4 C) (03/16 0733) Pulse Rate:  [58-102] 81 (03/16 0733) Resp:  [12-23] 12 (03/16 0733) BP: (98-118)/(49-62) 111/62 (03/16 0733) SpO2:  [97 %-100 %] 100 % (03/16 0733) Last BM Date: (PTA)  Intake/Output from previous day: 03/15 0701 - 03/16 0700 In: 450 [P.O.:450] Out: 2850 [Urine:2600; Drains:250] Intake/Output this shift: Total I/O In: -  Out: 850 [Urine:850]  PE: General: WD, male who is sitting in bed in NAD HEENT: abrasions to forehead well healing without signs of infection.  PERRL.  Ears and nose without any masses or lesions.  Mouth is pink and moist Neck: Cervical collar present Heart: regular, rate, and rhythm.  Normal s1,s2. No obvious murmurs, gallops, or rubs noted.  Palpable radial and pedal pulses bilaterally Lungs: CTAB, no wheezes, rhonchi, or rales noted.  Respiratory effort nonlabored Abd: soft, NT, ND, +BS, no masses, hernias, or organomegaly MS: BL LEs edematous, BL UEs with trace edema, drain present with SS drainage Skin: warm and dry with no masses, lesions, or rashes Neuro: Cranial nerves 2-12 grossly intact, no sensation below the level of nipples, good motor function and strength in BL UEs.   Psych: A&Ox3 with an appropriate affect.   Lab Results:  Recent Labs    05/09/19 0611 05/10/19 0543  WBC 9.9 10.4  HGB 8.1* 8.3*  HCT 24.2* 25.1*  PLT 186 221   BMET Recent Labs    05/09/19  1906 05/10/19 0543  NA 138 141  K 3.3* 3.4*  CL 104 107  CO2 23 25  GLUCOSE 114* 117*  BUN 16 18  CREATININE 1.13 1.33*  CALCIUM 8.1* 8.0*   PT/INR No results for input(s): LABPROT, INR in the last 72 hours. CMP     Component Value Date/Time   NA 141 05/10/2019 0543   K 3.4 (L) 05/10/2019 0543   CL 107 05/10/2019 0543   CO2 25 05/10/2019 0543   GLUCOSE 117 (H) 05/10/2019 0543   BUN 18 05/10/2019 0543   CREATININE 1.33 (H) 05/10/2019 0543   CALCIUM 8.0 (L) 05/10/2019 0543   PROT 5.9 (L) 05/09/2019 1906   ALBUMIN 2.7 (L) 05/09/2019 1906   AST 71 (H) 05/09/2019 1906   ALT 44 05/09/2019 1906   ALKPHOS 36 (L) 05/09/2019 1906   BILITOT 1.0 05/09/2019 1906   GFRNONAA >60 05/10/2019 0543   GFRAA >60 05/10/2019 0543   Lipase  No results found for: LIPASE     Studies/Results: DG CHEST PORT 1 VIEW  Result Date: 05/09/2019 CLINICAL DATA:  Sudden onset fever. EXAM: PORTABLE CHEST 1 VIEW COMPARISON:  05/06/2019 and CT chest 05/04/2019. FINDINGS: Curvilinear density seen between fusion rods may be external to the patient. Heart size normal. Small to moderate bilateral pleural effusions with bibasilar airspace opacification, left greater right. IMPRESSION: 1. Small to moderate layering bilateral pleural effusions. 2. Bibasilar airspace opacification, left greater than right,  which may be due to atelectasis. Difficult to exclude left lower lobe pneumonia. Electronically Signed   By: Lorin Picket M.D.   On: 05/09/2019 09:31    Anti-infectives: Anti-infectives (From admission, onward)   Start     Dose/Rate Route Frequency Ordered Stop   05/05/19 0800  ceFAZolin (ANCEF) IVPB 2g/100 mL premix     2 g 200 mL/hr over 30 Minutes Intravenous Every 8 hours 05/05/19 0102 05/05/19 0805   05/04/19 2112  bacitracin 50,000 Units in sodium chloride 0.9 % 500 mL irrigation  Status:  Discontinued       As needed 05/04/19 2112 05/05/19 0046   05/04/19 1945  ceFAZolin (ANCEF) IVPB 2g/100 mL  premix     2 g 200 mL/hr over 30 Minutes Intravenous  Once 05/04/19 1931 05/05/19 0203       Assessment/Plan [facility] TBI/subgaleal hematoma- per Dr. Zada Finders. F/C. R orbit FX- per Dr. Janace Hoard Occipital condyle, C1 and C7 FXs - collar per Dr. Zada Finders Complex T3 and T4 FXs/paraplegia- SCI likely complete, S/P fusion T1-6 by Dr. Zada Finders 3/11 FX R 2nd and L 3rd ribs AKI- CR stable ABL anemia- stable Acute hypoxic ventilator dependent respiratory failure- resolved ETOH abuse- watch for WD, no signs Neurogenic urinary retention- foley Fever - resolved.  Temp of 59DG 387 almost certainly secondary to atelectasis and immobility Sinus pause - asymptomatic, continue to monitor   FEN -soft diet, SLIV, bowel regimen, multimodal pain control VTE- lovenox ID - Ancef 3/10-3/11  Dispo- CIR likely tomorrow   LOS: 6 days    Henreitta Cea , [facility] 05/10/2019, 10:36 AM Please see Amion for pager number during day hours 7:00am-4:30pm or 7:00am -11:30am on weekends
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
## 2019-05-10 NOTE — Progress Notes (Signed)
Neurosurgery Service Progress Note  Subjective: No acute events overnight  Objective: Vitals:   05/10/19 0014 05/10/19 0020 05/10/19 0733 05/10/19 1131  BP:  (!) 118/49 111/62 113/63  Pulse:  (!) 102 81 87  Resp:  (!) 23 12 20   Temp: 100 F (37.8 C)  99.4 F (37.4 C) 98.6 F (37 C)  TempSrc:   Oral Oral  SpO2:  100% 100% 99%  Weight:      Height:       Temp (24hrs), Avg:98.8 F (37.1 C), Min:97.7 F (36.5 C), Max:100 F (37.8 C)  CBC Latest Ref Rng & Units 05/10/2019 05/09/2019 05/08/2019  WBC 4.0 - 10.5 K/uL 10.4 9.9 10.7(H)  Hemoglobin 13.0 - 17.0 g/dL 8.3(L) 8.1(L) 7.8(L)  Hematocrit 39.0 - 52.0 % 25.1(L) 24.2(L) 22.9(L)  Platelets 150 - 400 K/uL 221 186 135(L)   BMP Latest Ref Rng & Units 05/10/2019 05/09/2019 05/09/2019  Glucose 70 - 99 mg/dL 117(H) 114(H) 111(H)  BUN 6 - 20 mg/dL 18 16 13   Creatinine 0.61 - 1.24 mg/dL 1.33(H) 1.13 1.05  Sodium 135 - 145 mmol/L 141 138 142  Potassium 3.5 - 5.1 mmol/L 3.4(L) 3.3(L) 3.9  Chloride 98 - 111 mmol/L 107 104 109  CO2 22 - 32 mmol/L 25 23 22   Calcium 8.9 - 10.3 mg/dL 8.0(L) 8.1(L) 8.0(L)    Intake/Output Summary (Last 24 hours) at 05/10/2019 1245 Last data filed at 05/10/2019 1131 Gross per 24 hour  Intake 250 ml  Output 2400 ml  Net -2150 ml    Current Facility-Administered Medications:  .  0.9 %  sodium chloride infusion, 250 mL, Intravenous, PRN, Rayburn, Kelly A, PA-C .  acetaminophen (TYLENOL) tablet 1,000 mg, 1,000 mg, Oral, Q8H, Rayburn, Kelly A, PA-C, 1,000 mg at 05/10/19 0757 .  baclofen (LIORESAL) tablet 5 mg, 5 mg, Oral, TID, Rayburn, Kelly A, PA-C, 5 mg at 05/10/19 0859 .  bisacodyl (DULCOLAX) suppository 10 mg, 10 mg, Rectal, Once, Rayburn, Kelly A, PA-C .  chlorhexidine gluconate (MEDLINE KIT) (PERIDEX) 0.12 % solution 15 mL, 15 mL, Mouth Rinse, BID, Stark Klein, MD, 15 mL at 05/09/19 2004 .  Chlorhexidine Gluconate Cloth 2 % PADS 6 each, 6 each, Topical, Daily, Stark Klein, MD, 6 each at 05/09/19  0949 .  docusate sodium (COLACE) capsule 100 mg, 100 mg, Oral, BID, Stark Klein, MD, 100 mg at 05/10/19 0859 .  enoxaparin (LOVENOX) injection 40 mg, 40 mg, Subcutaneous, Q12H, Rayburn, Kelly A, PA-C, 40 mg at 05/10/19 0800 .  folic acid (FOLVITE) tablet 1 mg, 1 mg, Oral, Daily, Jesusita Oka, MD, 1 mg at 05/10/19 0859 .  gabapentin (NEURONTIN) capsule 300 mg, 300 mg, Oral, TID, Rayburn, Kelly A, PA-C, 300 mg at 05/10/19 0859 .  guaiFENesin (MUCINEX) 12 hr tablet 600 mg, 600 mg, Oral, BID, Rayburn, Kelly A, PA-C, 600 mg at 05/10/19 0859 .  HYDROmorphone (DILAUDID) injection 0.25 mg, 0.25 mg, Intravenous, Q6H PRN, Jesusita Oka, MD, 0.25 mg at 05/09/19 2157 .  ipratropium-albuterol (DUONEB) 0.5-2.5 (3) MG/3ML nebulizer solution 3 mL, 3 mL, Nebulization, Q4H PRN, Georganna Skeans, MD .  ketorolac (TORADOL) 30 MG/ML injection 30 mg, 30 mg, Intravenous, Q8H PRN, Saverio Danker, PA-C .  LORazepam (ATIVAN) tablet 1-4 mg, 1-4 mg, Oral, Q1H PRN **OR** LORazepam (ATIVAN) injection 1-4 mg, 1-4 mg, Intravenous, Q1H PRN, Jesusita Oka, MD .  multivitamin with minerals tablet 1 tablet, 1 tablet, Oral, Daily, Jesusita Oka, MD, 1 tablet at 05/10/19 0858 .  ondansetron (ZOFRAN-ODT) disintegrating  tablet 4 mg, 4 mg, Oral, Q6H PRN **OR** ondansetron (ZOFRAN) injection 4 mg, 4 mg, Intravenous, Q6H PRN, Stark Klein, MD .  oxyCODONE (Oxy IR/ROXICODONE) immediate release tablet 5-10 mg, 5-10 mg, Oral, Q4H PRN, Rayburn, Kelly A, PA-C, 10 mg at 05/10/19 0859 .  polyethylene glycol (MIRALAX / GLYCOLAX) packet 17 g, 17 g, Oral, BID, Saverio Danker, PA-C .  sodium chloride flush (NS) 0.9 % injection 3 mL, 3 mL, Intravenous, Q12H, Rayburn, Kelly A, PA-C, 3 mL at 05/10/19 0902 .  sodium chloride flush (NS) 0.9 % injection 3 mL, 3 mL, Intravenous, PRN, Rayburn, Kelly A, PA-C .  thiamine tablet 100 mg, 100 mg, Oral, Daily, Rayburn, Kelly A, PA-C, 100 mg at 05/10/19 8127   Physical Exam: Awake/alert, 5/5 in BUE  w/ intact sensation, T5 ASIA A  Assessment & Plan: 34 y.o. man s/p [facility] w/ T5 ASIA A 2/2 severe T3/T4 fracture dislocation, also w/ C7 facet frx, C1 frx, occipital condyle frx. 3/15 drain d/c'd  -continue rigid C-collar for cervical / O-1 fractures -okay for DVT chemoprophylaxis  Judith Part  05/10/19 12:45 PM

## 2019-05-10 NOTE — Progress Notes (Signed)
Inpatient Rehabilitation Admissions Coordinator  I met with patient and his girlfriend, [name], at bedside. We discussed bowel and bladder training that will need to occur with his rehab stay. We will plan admit to CIR tomorrow and they are in agreement.  Danne Baxter, RN, MSN Rehab Admissions Coordinator [phone] 05/10/2019 1:09 PM

## 2019-05-11 ENCOUNTER — Encounter (HOSPITAL_COMMUNITY): Payer: Self-pay | Admitting: General Practice

## 2019-05-11 ENCOUNTER — Encounter (HOSPITAL_COMMUNITY): Payer: Self-pay | Admitting: Physical Medicine and Rehabilitation

## 2019-05-11 ENCOUNTER — Inpatient Hospital Stay (HOSPITAL_COMMUNITY)
Admission: RE | Admit: 2019-05-11 | Discharge: 2019-06-01 | DRG: 560 | Disposition: A | Discharge status: Home Health | Payer: Medicaid Other | Source: Intra-hospital | Attending: Physical Medicine and Rehabilitation | Admitting: Physical Medicine and Rehabilitation

## 2019-05-11 DIAGNOSIS — N4830 Priapism, unspecified: Secondary | ICD-10-CM | POA: Diagnosis not present

## 2019-05-11 DIAGNOSIS — F17210 Nicotine dependence, cigarettes, uncomplicated: Secondary | ICD-10-CM | POA: Diagnosis present

## 2019-05-11 DIAGNOSIS — N390 Urinary tract infection, site not specified: Secondary | ICD-10-CM | POA: Diagnosis not present

## 2019-05-11 DIAGNOSIS — G904 Autonomic dysreflexia: Secondary | ICD-10-CM | POA: Diagnosis present

## 2019-05-11 DIAGNOSIS — G4700 Insomnia, unspecified: Secondary | ICD-10-CM | POA: Diagnosis present

## 2019-05-11 DIAGNOSIS — Z09 Encounter for follow-up examination after completed treatment for conditions other than malignant neoplasm: Secondary | ICD-10-CM

## 2019-05-11 DIAGNOSIS — D62 Acute posthemorrhagic anemia: Secondary | ICD-10-CM | POA: Diagnosis not present

## 2019-05-11 DIAGNOSIS — R159 Full incontinence of feces: Secondary | ICD-10-CM | POA: Diagnosis present

## 2019-05-11 DIAGNOSIS — S12000D Unspecified displaced fracture of first cervical vertebra, subsequent encounter for fracture with routine healing: Secondary | ICD-10-CM | POA: Diagnosis not present

## 2019-05-11 DIAGNOSIS — K592 Neurogenic bowel, not elsewhere classified: Secondary | ICD-10-CM | POA: Diagnosis present

## 2019-05-11 DIAGNOSIS — G8221 Paraplegia, complete: Secondary | ICD-10-CM | POA: Diagnosis present

## 2019-05-11 DIAGNOSIS — S22049D Unspecified fracture of fourth thoracic vertebra, subsequent encounter for fracture with routine healing: Secondary | ICD-10-CM

## 2019-05-11 DIAGNOSIS — T1490XS Injury, unspecified, sequela: Secondary | ICD-10-CM | POA: Diagnosis present

## 2019-05-11 DIAGNOSIS — R411 Anterograde amnesia: Secondary | ICD-10-CM | POA: Diagnosis present

## 2019-05-11 DIAGNOSIS — K5900 Constipation, unspecified: Secondary | ICD-10-CM | POA: Diagnosis present

## 2019-05-11 DIAGNOSIS — S24112A Complete lesion at T2-T6 level of thoracic spinal cord, initial encounter: Secondary | ICD-10-CM

## 2019-05-11 DIAGNOSIS — Z4789 Encounter for other orthopedic aftercare: Secondary | ICD-10-CM | POA: Diagnosis present

## 2019-05-11 DIAGNOSIS — S22039D Unspecified fracture of third thoracic vertebra, subsequent encounter for fracture with routine healing: Secondary | ICD-10-CM

## 2019-05-11 DIAGNOSIS — R412 Retrograde amnesia: Secondary | ICD-10-CM | POA: Diagnosis present

## 2019-05-11 DIAGNOSIS — N319 Neuromuscular dysfunction of bladder, unspecified: Secondary | ICD-10-CM | POA: Diagnosis present

## 2019-05-11 DIAGNOSIS — S0231XD Fracture of orbital floor, right side, subsequent encounter for fracture with routine healing: Secondary | ICD-10-CM

## 2019-05-11 DIAGNOSIS — G8220 Paraplegia, unspecified: Secondary | ICD-10-CM | POA: Diagnosis present

## 2019-05-11 DIAGNOSIS — S22040S Wedge compression fracture of fourth thoracic vertebra, sequela: Secondary | ICD-10-CM

## 2019-05-11 DIAGNOSIS — S02113D Unspecified occipital condyle fracture, subsequent encounter for fracture with routine healing: Secondary | ICD-10-CM

## 2019-05-11 DIAGNOSIS — M4850XA Collapsed vertebra, not elsewhere classified, site unspecified, initial encounter for fracture: Secondary | ICD-10-CM | POA: Diagnosis present

## 2019-05-11 LAB — BASIC METABOLIC PANEL
Anion gap: 9 (ref 5–15)
BUN: 20 mg/dL (ref 6–20)
CO2: 25 mmol/L (ref 22–32)
Calcium: 8.4 mg/dL — ABNORMAL LOW (ref 8.9–10.3)
Chloride: 108 mmol/L (ref 98–111)
Creatinine, Ser: 1.06 mg/dL (ref 0.61–1.24)
GFR calc Af Amer: >60 mL/min (ref >60)
GFR calc non Af Amer: >60 mL/min (ref >60)
Glucose, Bld: 104 mg/dL — ABNORMAL HIGH (ref 70–99)
Potassium: 3.8 mmol/L (ref 3.5–5.1)
Sodium: 142 mmol/L (ref 135–145)

## 2019-05-11 MED ORDER — MORPHINE SULFATE ER 15 MG PO TBCR
15.0000 mg | EXTENDED_RELEASE_TABLET | Freq: Three times a day (TID) | ORAL | Status: DC
  Administered 2019-05-11 (×2): 15 mg via ORAL
  Filled 2019-05-11 (×2): qty 1

## 2019-05-11 MED ORDER — FLEET ENEMA 7-19 GM/118ML RE ENEM: 1.0000 | ENEMA | Freq: Once | RECTAL | Status: DC | PRN

## 2019-05-11 MED ORDER — GUAIFENESIN-DM 100-10 MG/5ML PO SYRP: 5.0000 mL | ORAL_SOLUTION | Freq: Four times a day (QID) | ORAL | Status: DC | PRN

## 2019-05-11 MED ORDER — ADULT MULTIVITAMIN W/MINERALS CH
1.0000 | ORAL_TABLET | Freq: Every day | ORAL | Status: DC
  Administered 2019-05-11 – 2019-06-01 (×22): 1 via ORAL
  Filled 2019-05-11 (×22): qty 1

## 2019-05-11 MED ORDER — BACLOFEN 5 MG HALF TABLET
5.0000 mg | ORAL_TABLET | Freq: Three times a day (TID) | ORAL | Status: DC | PRN
  Administered 2019-05-16 – 2019-05-21 (×2): 5 mg via ORAL
  Filled 2019-05-11 (×3): qty 1

## 2019-05-11 MED ORDER — ALUM & MAG HYDROXIDE-SIMETH 200-200-20 MG/5ML PO SUSP: 30.0000 mL | ORAL | Status: DC | PRN

## 2019-05-11 MED ORDER — TRAMADOL HCL 50 MG PO TABS
100.0000 mg | ORAL_TABLET | Freq: Four times a day (QID) | ORAL | Status: DC
  Administered 2019-05-11 – 2019-05-18 (×23): 100 mg via ORAL
  Filled 2019-05-11 (×30): qty 2

## 2019-05-11 MED ORDER — DIPHENHYDRAMINE HCL 12.5 MG/5ML PO ELIX: 12.5000 mg | ORAL_SOLUTION | Freq: Four times a day (QID) | ORAL | Status: DC | PRN

## 2019-05-11 MED ORDER — METHOCARBAMOL 500 MG PO TABS: 500.0000 mg | ORAL_TABLET | Freq: Four times a day (QID) | ORAL | Status: DC | PRN

## 2019-05-11 MED ORDER — ACETAMINOPHEN 325 MG PO TABS
325.0000 mg | ORAL_TABLET | ORAL | Status: DC | PRN
  Administered 2019-05-31 – 2019-06-01 (×2): 650 mg via ORAL
  Administered 2019-06-01: 325 mg via ORAL
  Filled 2019-05-11: qty 1
  Filled 2019-05-11 (×3): qty 2

## 2019-05-11 MED ORDER — OXYCODONE-ACETAMINOPHEN 5-325 MG PO TABS
1.0000 | ORAL_TABLET | ORAL | Status: DC | PRN
  Administered 2019-05-12 – 2019-05-13 (×5): 2 via ORAL
  Administered 2019-05-14 (×4): 1 via ORAL
  Administered 2019-05-14: 2 via ORAL
  Administered 2019-05-15: 1 via ORAL
  Administered 2019-05-16 – 2019-05-21 (×21): 2 via ORAL
  Administered 2019-05-21: 1 via ORAL
  Administered 2019-05-21 – 2019-05-31 (×44): 2 via ORAL
  Filled 2019-05-11 (×11): qty 2
  Filled 2019-05-11: qty 1
  Filled 2019-05-11 (×25): qty 2
  Filled 2019-05-11: qty 1
  Filled 2019-05-11 (×27): qty 2
  Filled 2019-05-11: qty 1
  Filled 2019-05-11 (×13): qty 2

## 2019-05-11 MED ORDER — PROCHLORPERAZINE MALEATE 5 MG PO TABS: 5.0000 mg | ORAL_TABLET | Freq: Four times a day (QID) | ORAL | Status: DC | PRN

## 2019-05-11 MED ORDER — BISACODYL 10 MG RE SUPP: 10.0000 mg | Freq: Every day | RECTAL | Status: DC | PRN

## 2019-05-11 MED ORDER — BISACODYL 10 MG RE SUPP
10.0000 mg | Freq: Every day | RECTAL | Status: DC
  Administered 2019-05-11: 10 mg via RECTAL
  Filled 2019-05-11: qty 1

## 2019-05-11 MED ORDER — OXYCODONE-ACETAMINOPHEN 10-325 MG PO TABS: 1.0000 | ORAL_TABLET | ORAL | Status: DC | PRN

## 2019-05-11 MED ORDER — SENNA 8.6 MG PO TABS
2.0000 | ORAL_TABLET | Freq: Every day | ORAL | Status: DC
  Administered 2019-05-12 – 2019-05-17 (×6): 17.2 mg via ORAL
  Filled 2019-05-11 (×8): qty 2

## 2019-05-11 MED ORDER — OXYCODONE HCL 5 MG PO TABS
5.0000 mg | ORAL_TABLET | ORAL | Status: DC | PRN
  Administered 2019-05-12 – 2019-05-13 (×6): 10 mg via ORAL
  Administered 2019-05-14 – 2019-05-15 (×6): 5 mg via ORAL
  Filled 2019-05-11 (×2): qty 1
  Filled 2019-05-11 (×2): qty 2
  Filled 2019-05-11: qty 1
  Filled 2019-05-11 (×7): qty 2

## 2019-05-11 MED ORDER — LIDOCAINE HCL URETHRAL/MUCOSAL 2 % EX GEL: 1.0000 "application " | CUTANEOUS | Status: DC | PRN

## 2019-05-11 MED ORDER — OXYCODONE-ACETAMINOPHEN 10-325 MG PO TABS
1.0000 | ORAL_TABLET | ORAL | Status: DC | PRN
Start: 2019-05-11 — End: 2019-05-11

## 2019-05-11 MED ORDER — PROCHLORPERAZINE EDISYLATE 10 MG/2ML IJ SOLN: 5.0000 mg | Freq: Four times a day (QID) | INTRAMUSCULAR | Status: DC | PRN

## 2019-05-11 MED ORDER — ENOXAPARIN SODIUM 40 MG/0.4ML ~~LOC~~ SOLN
40.0000 mg | SUBCUTANEOUS | Status: DC
  Administered 2019-05-12 – 2019-06-01 (×21): 40 mg via SUBCUTANEOUS
  Filled 2019-05-11 (×21): qty 0.4

## 2019-05-11 MED ORDER — PROCHLORPERAZINE 25 MG RE SUPP: 12.5000 mg | Freq: Four times a day (QID) | RECTAL | Status: DC | PRN

## 2019-05-11 MED ORDER — TRAZODONE HCL 50 MG PO TABS
25.0000 mg | ORAL_TABLET | Freq: Every evening | ORAL | Status: DC | PRN
Start: 2019-05-11 — End: 2019-06-01
  Administered 2019-05-15 – 2019-05-17 (×2): 50 mg via ORAL
  Filled 2019-05-11 (×2): qty 1

## 2019-05-11 MED ORDER — POLYETHYLENE GLYCOL 3350 17 G PO PACK
17.0000 g | PACK | Freq: Every day | ORAL | Status: DC | PRN
Start: 2019-05-11 — End: 2019-06-01

## 2019-05-11 NOTE — Progress Notes (Signed)
Inpatient Rehabilitation Medication Review by a Pharmacist  A complete drug regimen review was completed for this patient to identify any potential clinically significant medication issues.  Clinically significant medication issues were identified:  no  Pharmacist comments: none  Time spent performing this drug regimen review (minutes): 5 mins  Fabio Neighbors, PharmD PGY1 Ambulatory Care Resident [employer] # [phone]

## 2019-05-11 NOTE — TOC Transition Note (Signed)
Transition of Care [facility]) - CM/SW Discharge Note   Patient Details  Name: Michiah Mudry MRN: [id number] Date of Birth: Jan 08, 1986  Transition of Care [facility]) CM/SW Contact:  Glennon Mac, RN Phone Number: 05/11/2019, 2:13 PM   Clinical Narrative:  34 y.o. man s/p [facility] w/ T5 ASIA A 2/2 severe T3/T4 fracture dislocation, also w/ C7 facet frx, C1 frx, occipital condyle frx.  Head Ct showing Right frontoparietal vertex subgaleal hematoma. 05/05/19 T3, T4 laminectomies with decompression of spinal cord, transpedicular reduction of fracture; T1-T6 posterior instrumented fusion. PTA, pt independent, lives at home with fiance and children.  PT/OT recommending CIR, and pt accepted for admission today.  Family able to provide 24h assistance at discharge.       Final next level of care: IP Rehab Facility Barriers to Discharge: Barriers Resolved                       Discharge Plan and Services   Discharge Planning Services: CM Consult Post Acute Care Choice: IP Rehab                                 Readmission Risk Interventions No flowsheet data found.   Quintella Baton, RN, BSN  Trauma/Neuro ICU Case Manager [phone]
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
## 2019-05-11 NOTE — Plan of Care (Signed)
  Problem: Consults Goal: RH SPINAL CORD INJURY PATIENT EDUCATION Description:  See Patient Education module for education specifics.  Outcome: Progressing Goal: Skin Care Protocol Initiated - if Braden Score 18 or less Description: If consults are not indicated, leave blank or document N/A Outcome: Progressing   Problem: SCI BOWEL ELIMINATION Goal: RH STG MANAGE BOWEL WITH ASSISTANCE Description: STG Manage Bowel with max Assistance. Outcome: Progressing Goal: RH STG SCI MANAGE BOWEL WITH MEDICATION WITH ASSISTANCE Description: STG SCI Manage bowel with medication with max assistance. Outcome: Progressing Goal: RH STG SCI MANAGE BOWEL PROGRAM W/ASSIST OR AS APPROPRIATE Description: STG SCI Manage bowel program w/ max assist or as appropriate. Outcome: Progressing   Problem: SCI BLADDER ELIMINATION Goal: RH STG MANAGE BLADDER WITH ASSISTANCE Description: STG Manage Bladder With max Assistance Outcome: Progressing   Problem: RH SKIN INTEGRITY Goal: RH STG SKIN FREE OF INFECTION/BREAKDOWN Description: Skin to remain free from breakdown with max assist while on rehab. Outcome: Progressing Goal: RH STG MAINTAIN SKIN INTEGRITY WITH ASSISTANCE Description: STG Maintain Skin Integrity With max Assistance. Outcome: Progressing Goal: RH STG ABLE TO PERFORM INCISION/WOUND CARE W/ASSISTANCE Description: STG Able To Perform Incision/Wound Care With max Assistance. Outcome: Progressing   Problem: RH SAFETY Goal: RH STG ADHERE TO SAFETY PRECAUTIONS W/ASSISTANCE/DEVICE Description: STG Adhere to Safety Precautions With max Assistance appropriate assistive Device. Outcome: Progressing   Problem: RH PAIN MANAGEMENT Goal: RH STG PAIN MANAGED AT OR BELOW PT'S PAIN GOAL Description: <4 on a 0-10 pain scale. Outcome: Progressing   Problem: RH KNOWLEDGE DEFICIT SCI Goal: RH STG INCREASE KNOWLEDGE OF SELF CARE AFTER SCI Description: Patient and caregiver will demonstrate knowledge of  medication management, bowel and bladder management, skin care management, and follow up care with the MD post discharge with min assist from staff. Outcome: Progressing   
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
## 2019-05-11 NOTE — Progress Notes (Signed)
Patient discharged to CIR today. Report was called, 1 IV left intact per CIR RN, all patient belongings packed and patient escorted to CIR with wife.
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
## 2019-05-11 NOTE — Progress Notes (Signed)
Patient ID: [name] Ford, male   DOB: [birth year], 34 y.o.   MRN: [id number]    7 Days Post-Op  Subjective: Patient sleepy today.  Wakes up some and states he is ok.  No BM. Otherwise doesn't talk much secondary to sleeping.  ROS: See above, otherwise other systems negative  Objective: Vital signs in last 24 hours: Temp:  [98.3 F (36.8 C)-98.6 F (37 C)] 98.5 F (36.9 C) (03/17 0758) Pulse Rate:  [77-94] 77 (03/17 0758) Resp:  [16-25] 16 (03/17 0758) BP: (113-136)/(49-81) 124/49 (03/17 0758) SpO2:  [98 %-99 %] 98 % (03/17 0758) Last BM Date: (PTA)  Intake/Output from previous day: 03/16 0701 - 03/17 0700 In: 750 [P.O.:750] Out: 1375 [Urine:1375] Intake/Output this shift: Total I/O In: -  Out: 350 [Urine:350]  PE: General: WD, NAD, sleepy HEENT:abrasions to forehead well healing without signs of infection.PERRL.  Neck: Cervical collar present Heart: regular, rate, and rhythm. Normal s1,s2. No obvious murmurs, gallops, or rubs noted. Palpable radial and pedal pulses bilaterally Lungs: CTAB, no wheezes, rhonchi, or rales noted. Respiratory effort nonlabored Abd: soft, NT, ND, +BS, no masses, hernias, or organomegaly MS:BL LEs edematous, BL UEs with trace edema, drain removed Skin: warm and dry with no masses, lesions, or rashes Neuro: Cranial nerves 2-12 grossly intact,no sensation below the level of nipples, good motor function and strength in BL UEs.   Psych: sleepy but oriented with an appropriate affect.  Lab Results:  Recent Labs    05/09/19 0611 05/10/19 0543  WBC 9.9 10.4  HGB 8.1* 8.3*  HCT 24.2* 25.1*  PLT 186 221   BMET Recent Labs    05/09/19 1906 05/10/19 0543  NA 138 141  K 3.3* 3.4*  CL 104 107  CO2 23 25  GLUCOSE 114* 117*  BUN 16 18  CREATININE 1.13 1.33*  CALCIUM 8.1* 8.0*   PT/INR No results for input(s): LABPROT, INR in the last 72 hours. CMP     Component Value Date/Time   NA 141 05/10/2019 0543   K 3.4 (L) 05/10/2019  0543   CL 107 05/10/2019 0543   CO2 25 05/10/2019 0543   GLUCOSE 117 (H) 05/10/2019 0543   BUN 18 05/10/2019 0543   CREATININE 1.33 (H) 05/10/2019 0543   CALCIUM 8.0 (L) 05/10/2019 0543   PROT 5.9 (L) 05/09/2019 1906   ALBUMIN 2.7 (L) 05/09/2019 1906   AST 71 (H) 05/09/2019 1906   ALT 44 05/09/2019 1906   ALKPHOS 36 (L) 05/09/2019 1906   BILITOT 1.0 05/09/2019 1906   GFRNONAA >60 05/10/2019 0543   GFRAA >60 05/10/2019 0543   Lipase  No results found for: LIPASE     Studies/Results: No results found.  Anti-infectives: Anti-infectives (From admission, onward)   Start     Dose/Rate Route Frequency Ordered Stop   05/05/19 0800  ceFAZolin (ANCEF) IVPB 2g/100 mL premix     2 g 200 mL/hr over 30 Minutes Intravenous Every 8 hours 05/05/19 0102 05/05/19 0805   05/04/19 2112  bacitracin 50,000 Units in sodium chloride 0.9 % 500 mL irrigation  Status:  Discontinued       As needed 05/04/19 2112 05/05/19 0046   05/04/19 1945  ceFAZolin (ANCEF) IVPB 2g/100 mL premix     2 g 200 mL/hr over 30 Minutes Intravenous  Once 05/04/19 1931 05/05/19 0203       Assessment/Plan [facility] TBI/subgaleal hematoma- per Dr. Maurice Small. F/C. R orbit FX- per Dr. Jearld Fenton Occipital condyle, C1 and C7 FXs - collar per  Dr. Zada Finders Complex T3 and T4 FXs/paraplegia- SCI likely complete, S/P fusion T1-6 by Dr. Zada Finders 3/11 FX R 2nd and L 3rd ribs AKI- CR stable ABL anemia- stable Acute hypoxic ventilator dependent respiratory failure- resolved ETOH abuse- watch for WD, no signs Neurogenic urinary retention- foley Fever- resolved.   Sinus pause- asymptomatic, continue to monitor   FEN -soft diet, SLIV, bowel regimen with daily suppository, multimodal pain control VTE-lovenox ID- Ancef 3/10-3/11  Dispo-CIR today hopefully   LOS: 7 days    Henreitta Cea , [facility] Surgery 05/11/2019, 9:24 AM Please see Amion for pager number during day hours 7:00am-4:30pm or 7:00am  -11:30am on weekends

## 2019-05-11 NOTE — Progress Notes (Signed)
Patient arrived to unit via bed. A/O x4. Accompanied by fiance. Patient was oriented to unit. Fall safety plan and visitors rules have been reviewed. No concerns to report at this time. Leane Para, LPN

## 2019-05-12 ENCOUNTER — Inpatient Hospital Stay (HOSPITAL_COMMUNITY): Payer: Self-pay | Admitting: Physical Therapy

## 2019-05-12 ENCOUNTER — Inpatient Hospital Stay (HOSPITAL_COMMUNITY): Payer: Self-pay

## 2019-05-12 DIAGNOSIS — N319 Neuromuscular dysfunction of bladder, unspecified: Secondary | ICD-10-CM | POA: Diagnosis present

## 2019-05-12 DIAGNOSIS — T1490XS Injury, unspecified, sequela: Secondary | ICD-10-CM | POA: Diagnosis present

## 2019-05-12 DIAGNOSIS — G8220 Paraplegia, unspecified: Secondary | ICD-10-CM

## 2019-05-12 DIAGNOSIS — S24112A Complete lesion at T2-T6 level of thoracic spinal cord, initial encounter: Secondary | ICD-10-CM | POA: Diagnosis present

## 2019-05-12 DIAGNOSIS — K592 Neurogenic bowel, not elsewhere classified: Secondary | ICD-10-CM | POA: Diagnosis present

## 2019-05-12 DIAGNOSIS — G8221 Paraplegia, complete: Secondary | ICD-10-CM | POA: Diagnosis present

## 2019-05-12 LAB — CBC WITH DIFFERENTIAL/PLATELET
Abs Immature Granulocytes: 0.22 10*3/uL — ABNORMAL HIGH (ref 0.00–0.07)
Basophils Absolute: 0 10*3/uL (ref 0.0–0.1)
Basophils Relative: 0 %
Eosinophils Absolute: 0.2 10*3/uL (ref 0.0–0.5)
Eosinophils Relative: 2 %
HCT: 27.9 % — ABNORMAL LOW (ref 39.0–52.0)
Hemoglobin: 9 g/dL — ABNORMAL LOW (ref 13.0–17.0)
Immature Granulocytes: 2 %
Lymphocytes Relative: 18 %
Lymphs Abs: 1.8 10*3/uL (ref 0.7–4.0)
MCH: 31.1 pg (ref 26.0–34.0)
MCHC: 32.3 g/dL (ref 30.0–36.0)
MCV: 96.5 fL (ref 80.0–100.0)
Monocytes Absolute: 1.3 10*3/uL — ABNORMAL HIGH (ref 0.1–1.0)
Monocytes Relative: 13 %
Neutro Abs: 6.6 10*3/uL (ref 1.7–7.7)
Neutrophils Relative %: 65 %
Platelets: 266 10*3/uL (ref 150–400)
RBC: 2.89 MIL/uL — ABNORMAL LOW (ref 4.22–5.81)
RDW: 13.6 % (ref 11.5–15.5)
WBC: 10.1 10*3/uL (ref 4.0–10.5)
nRBC: 0 % (ref 0.0–0.2)

## 2019-05-12 LAB — URINALYSIS, ROUTINE W REFLEX MICROSCOPIC
Bilirubin Urine: NEGATIVE
Glucose, UA: NEGATIVE mg/dL
Ketones, ur: NEGATIVE mg/dL
Nitrite: NEGATIVE
Protein, ur: 30 mg/dL — AB
RBC / HPF: >50 RBC/hpf — ABNORMAL HIGH (ref 0–5)
Specific Gravity, Urine: 1.024 (ref 1.005–1.030)
pH: 6 (ref 5.0–8.0)

## 2019-05-12 LAB — COMPREHENSIVE METABOLIC PANEL
ALT: 79 U/L — ABNORMAL HIGH (ref 0–44)
AST: 113 U/L — ABNORMAL HIGH (ref 15–41)
Albumin: 2.5 g/dL — ABNORMAL LOW (ref 3.5–5.0)
Alkaline Phosphatase: 52 U/L (ref 38–126)
Anion gap: 10 (ref 5–15)
BUN: 20 mg/dL (ref 6–20)
CO2: 27 mmol/L (ref 22–32)
Calcium: 8.5 mg/dL — ABNORMAL LOW (ref 8.9–10.3)
Chloride: 105 mmol/L (ref 98–111)
Creatinine, Ser: 1.16 mg/dL (ref 0.61–1.24)
GFR calc Af Amer: >60 mL/min (ref >60)
GFR calc non Af Amer: >60 mL/min (ref >60)
Glucose, Bld: 110 mg/dL — ABNORMAL HIGH (ref 70–99)
Potassium: 4 mmol/L (ref 3.5–5.1)
Sodium: 142 mmol/L (ref 135–145)
Total Bilirubin: 1 mg/dL (ref 0.3–1.2)
Total Protein: 6 g/dL — ABNORMAL LOW (ref 6.5–8.1)

## 2019-05-12 MED ORDER — CHLORHEXIDINE GLUCONATE CLOTH 2 % EX PADS
6.0000 | MEDICATED_PAD | Freq: Every day | CUTANEOUS | Status: DC
  Administered 2019-05-12 – 2019-05-18 (×5): 6 via TOPICAL

## 2019-05-12 MED ORDER — BISACODYL 10 MG RE SUPP
10.0000 mg | Freq: Every day | RECTAL | Status: DC
  Administered 2019-05-12 – 2019-05-31 (×19): 10 mg via RECTAL
  Filled 2019-05-12 (×25): qty 1

## 2019-05-12 NOTE — Progress Notes (Signed)
Inpatient Rehabilitation  Patient information reviewed and entered into eRehab system by Rosea Dory M. Rasheda Ledger, M.A., CCC/SLP, PPS Coordinator.  Information including medical coding, functional ability and quality indicators will be reviewed and updated through discharge.    
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
## 2019-05-12 NOTE — Progress Notes (Signed)
Urine collected this morning for urine culture and urinalysis and was sent to Lab. Lajoyce Corners Lanaiya Lantry, LPN

## 2019-05-12 NOTE — Progress Notes (Signed)
Patient  Had scheduled Morphine Sulfate last night at 2200. Patient requested not to take Morphine Sulfate anymore. Patient stated "it felt like I can't breath". Honey comb dressing in patient back have moderate amount of drainage. Will continue to monitor patient.

## 2019-05-12 NOTE — Progress Notes (Signed)
Occupational Therapy Session Note  Patient Details  Name: [name] Ford MRN: [id number] Date of Birth: 1985/08/07  Today's Date: 05/12/2019 OT Individual Time: 1045-1130 OT Individual Time Calculation (min): 45 min  and Today's Date: 05/12/2019 OT Missed Time: 30 Minutes Missed Time Reason: Patient fatigue;Pain   Short Term Goals: Week 1:  OT Short Term Goal 1 (Week 1): Pt will maintain sitting balance with max A of 1 caregiver for 5 min to demo improved balance OT Short Term Goal 2 (Week 1): Pt will complete 2/4 steps of UB dressing with S OT Short Term Goal 3 (Week 1): Pt will complete transfer to w/c wiht MAX A of 1 caregiver in prep for [facility] transfer  Skilled Therapeutic Interventions/Progress Updates:    Pt resting in bed with HOB elevated.  Pt on phone with fiancee present.  Pt questioned how many therapists was he going to see during the day.  Explained to pt that he would be receiving a minimum of 3-3.5 hours of therapy daily which could equate to 5 therapy sessions.  Pt surprised by amount of therapy because he stated he didn't get that much in other floor of hospital.  Discussed role of OT and home setup/DME requirements. Pt with difficulty keeping eyes open during session.  Focus on orientation to rehab process, discharge planning, LTGs, DME requirements and role of OT. Pt remained in bed with fiancee present.   Therapy Documentation Precautions:  Precautions Precautions: Fall, Cervical Precaution Comments: Pt reports he is supposed to get new pads for his c-collar. Educated he should be lying flat and roll for doffing brace (with nursing assist) Required Braces or Orthoses: Cervical Brace Cervical Brace: Hard collar, At all times Restrictions Weight Bearing Restrictions: No General: General OT Amount of Missed Time: 30 Minutes Pain:  Pt c/o back and neck pain (unrated); emotional support and repositioned   Therapy/Group: Individual Therapy  [name] 05/12/2019, 2:37 PM

## 2019-05-12 NOTE — Progress Notes (Signed)
Patient and fiance requested to have x-ray of L elbow. Pt stated concerns of elbow being sore to touch, and painful on movement. Patient refused X-rays x 2. Will continue to monitor. Leane Para, LPN

## 2019-05-13 ENCOUNTER — Inpatient Hospital Stay (HOSPITAL_COMMUNITY): Payer: Medicaid Other

## 2019-05-13 ENCOUNTER — Inpatient Hospital Stay (HOSPITAL_COMMUNITY): Payer: Self-pay | Admitting: Physical Therapy

## 2019-05-13 ENCOUNTER — Inpatient Hospital Stay (HOSPITAL_COMMUNITY): Payer: Self-pay

## 2019-05-13 LAB — URINE CULTURE: Culture: NO GROWTH

## 2019-05-13 NOTE — Progress Notes (Signed)
Patient alert and oriented and has requested and recived scheduled and prn pain meds throughout shift. Patient voices some relief. Patient request that surgical dressing not be removed X3 this shift. Educated and informed patient that there was an ordered place; patient request to have the surgical honeycomb dressing  removed on next shift.  Will check with patient about removal of dressing before shift ends and make oncoming nurse aware and provider if unable too.
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
## 2019-05-13 NOTE — Progress Notes (Signed)
Orthopedic Tech Progress Note Patient Details:  [name] Ford [birth year] [id number]  Ortho Devices Type of Ortho Device: Sling immobilizer Ortho Device/Splint Location: left Ortho Device/Splint Interventions: Application   Post Interventions Patient Tolerated: Well Instructions Provided: Care of device   [name] 05/13/2019, 3:59 PM

## 2019-05-13 NOTE — Progress Notes (Signed)
Patient request to have honeycomb surgical dressing removed by next shift. Dressing on and intact with dried drainage observed. Oncoming nurse made aware of request for dressing removal and dressing change. Will continue to monitor

## 2019-05-13 NOTE — Progress Notes (Signed)
Occupational Therapy Session Note  Patient Details  Name: Deray Dawes MRN: [id number] Date of Birth: 24-Oct-1985  Today's Date: 05/13/2019 OT Individual Time: 0800-0900 OT Individual Time Calculation (min): 60 min    Short Term Goals: Week 1:  OT Short Term Goal 1 (Week 1): Pt will maintain sitting balance with max A of 1 caregiver for 5 min to demo improved balance OT Short Term Goal 2 (Week 1): Pt will complete 2/4 steps of UB dressing with S OT Short Term Goal 3 (Week 1): Pt will complete transfer to w/c wiht MAX A of 1 caregiver in prep for [facility] transfer  Skilled Therapeutic Interventions/Progress Updates:    Pt resting in bed upon arrival. OT intervention with focus on BADL retraining and bed mobility. Pt engaged in bathing/dressing tasks at bed level. See Care Tool for assist levels.  Pt able to pull into unsupported sitting using bed rails but requires assistance to maintain without BUE support. Pt c/o L elbow pain but reports that he refused xrays because he was mad. Pt dependent for LB dressing tasks.  Pt dependent for BLE management when rolling in bed but able to use bed rails to assist with rolling-mod A+2 for rolling. No c/o dizziness with HOB elevated. Discussed LTG with pt. Pt remained in bed with all needs within reach.  Therapy Documentation Precautions:  Precautions Precautions: Fall, Cervical Precaution Booklet Issued: No Precaution Comments: Reveiwed precautions with patient Required Braces or Orthoses: Cervical Brace Cervical Brace: Hard collar, At all times Restrictions Weight Bearing Restrictions: No  Pain: Pain Assessment Pain Scale: 0-10 Pain Score: 8  Pain Type: Acute pain Pain Location: Neck Pain Orientation: Posterior Pain Descriptors / Indicators: Aching;Stabbing Pain Frequency: Constant Pain Onset: On-going Patients Stated Pain Goal: 2 Pain Intervention(s): repositioned  Therapy/Group: Individual Therapy  [name] 05/13/2019, 9:07  AM

## 2019-05-13 NOTE — Care Management (Signed)
[facility]patient Rehabilitation Center Individual Statement of Services  Patient Name:  [name] Ford  Date:  05/13/2019  Welcome to the Inpatient Rehabilitation Center.  Our goal is to provide you with an individualized program based on your diagnosis and situation, designed to meet your specific needs.  With this comprehensive rehabilitation program, you will be expected to participate in at least 3 hours of rehabilitation therapies Monday-Friday, with modified therapy programming on the weekends.  Your rehabilitation program will include the following services:  Physical Therapy (PT), Occupational Therapy (OT), Speech Therapy (ST), 24 hour per day rehabilitation nursing, Therapeutic Recreaction (TR), Psychology, Neuropsychology, Case Management (Social Worker), Rehabilitation Medicine, Nutrition Services, Pharmacy Services and Other  Weekly team conferences will be held on Tuesdays  to discuss your progress.  Your Social Worker will talk with you frequently to get your input and to update you on team discussions.  Team conferences with you and your family in attendance may also be held.  Expected length of stay: 4 weeks   Overall anticipated outcome: Moderate Assistance  Depending on your progress and recovery, your program may change. Your Social Worker will coordinate services and will keep you informed of any changes. Your Social Worker's name and contact numbers are listed  below.  The following services may also be recommended but are not provided by the Inpatient Rehabilitation Center:   Driving Evaluations  Home Health Rehabiltiation Services  Outpatient Rehabilitation Services  Vocational Rehabilitation   Arrangements will be made to provide these services after discharge if needed.  Arrangements include referral to agencies that provide these services.  Your insurance has been verified to be:  Uninsured  Your primary doctor is:  No PCP  Pertinent information will be shared with  your doctor and your insurance company.  Social Worker:  Cecile Sheerer, LCSWA  Information discussed with and copy given to patient by: Gretchen Short, 05/13/2019, 10:19 AM

## 2019-05-13 NOTE — Progress Notes (Signed)
Physical Therapy Session Note  Patient Details  Name: [name] Ford MRN: [id number] Date of Birth: November 17, 1985  Today's Date: 05/13/2019 PT Individual Time: 1000-1045 PT Individual Time Calculation (min): 45 min   Short Term Goals: Week 1:  PT Short Term Goal 1 (Week 1): Pt will initiate OOB transfers PT Short Term Goal 2 (Week 1): Pt will tolerate sitting up in w/c x 1 hour PT Short Term Goal 3 (Week 1): Pt will initiate w/c mobility  Skilled Therapeutic Interventions/Progress Updates:    Pt received seated in bed, complaining of 8/10 pain in posterior neck and upper back region. RN in room to provide pain medication at beginning of session. Pt declines any attempts at OOB mobility this date 2/2 pain. Pt also with complaints of L elbow pain at times. Provided hot pack at end of session for pain management. Assisted pt with anterior leans while seated in bed with use of BUE on bedrails while RN changed dressing to surgical site. Assisted pt with doffing sweatshirt jacket with max A and donning new lighter paper scrub top with assist x 2. Seated in bed to sitting EOB with assist x 2 for BLE management and trunk control. Pt tolerates sitting EOB x 10 min with no onset of dizziness. Pt is able to intermittently support himself seated EOB with one UE support and close SBA. Assisted pt back to supine with assist x 2. Pt is assist x 2 to scoot up towards HOB. Pt left seated in bed with needs in reach at end of session.  Therapy Documentation Precautions:  Precautions Precautions: Fall, Cervical Precaution Booklet Issued: No Precaution Comments: Reveiwed precautions with patient Required Braces or Orthoses: Cervical Brace Cervical Brace: Hard collar, At all times Restrictions Weight Bearing Restrictions: No    Therapy/Group: Individual Therapy   Peter Congo, PT, DPT  05/13/2019, 2:55 PM

## 2019-05-13 NOTE — Progress Notes (Signed)
Physical Therapy Session Note  Patient Details  Name: [name] Ford MRN: [id number] Date of Birth: 1985/11/04  Today's Date: 05/13/2019 PT Individual Time: 1300-1335 PT Individual Time Calculation (min): 35 min   Short Term Goals: Week 1:  PT Short Term Goal 1 (Week 1): Pt will initiate OOB transfers PT Short Term Goal 2 (Week 1): Pt will tolerate sitting up in w/c x 1 hour PT Short Term Goal 3 (Week 1): Pt will initiate w/c mobility  Skilled Therapeutic Interventions/Progress Updates:    Patient received in bed reporting high levels of pain and that he does not like to do anything in the afternoons, seems very surprised that therapy has returned and stated "how many times are you guys going to come?!?!?!?!". Educated on importance of therapy and expectations for participation as well as general mentality of IP rehab and he seemed to become agreeable to participate although kept complaining of pain. Performed some basic ranging of BLEs, then utilized Kreg bed to tilt in his room today. Made it up to 25 degrees and stayed in this position for 2 minutes before he became very annoyed with PT/tech and stated "I told you I can't do anything this afternoon with you guys, put me back down!". Returned kreg bed to baseline position and further educated him on importance of participating in PT given the severe nature of his injuries, he continued to remain resistant to and refusing any additional therapy this afternoon and requests that all of his therapy be in the morning rather than spread through the day. Discussed case with RN, who reports he has become much less participatory for them in the afternoon as well, and with rehab supervisor as well as primary PT. He was left in bed with all needs met this afternoon.   Therapy Documentation Precautions:  Precautions Precautions: Fall, Cervical Precaution Booklet Issued: No Precaution Comments: Reveiwed precautions with patient Required Braces or Orthoses:  Cervical Brace Cervical Brace: Hard collar, At all times Restrictions Weight Bearing Restrictions: No General: PT Amount of Missed Time (min): 10 Minutes PT Missed Treatment Reason: Pain;Patient unwilling to participate Pain: Pain Assessment Pain Scale: 0-10 Pain Score: 9  Pain Type: Acute pain Pain Location: Generalized Pain Orientation: Posterior Pain Descriptors / Indicators: Aching;Stabbing;Discomfort Pain Onset: On-going Patients Stated Pain Goal: 0 Pain Intervention(s): RN made aware;Repositioned;Emotional support    Therapy/Group: Individual Therapy   Windell Norfolk, DPT, PN1   Supplemental Physical Therapist [place]    Pager [phone] Acute Rehab Office [phone]    05/13/2019, 3:42 PM

## 2019-05-13 NOTE — IPOC Note (Addendum)
Overall Plan of Care [facility]) Patient Details Name: [name] Ford MRN: [id number] DOB: 08-19-1985  Admitting Diagnosis: 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 level of thoracic spinal cord [facility])  Hospital Problems: Principal Problem:   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 level of thoracic spinal cord [facility]) Active Problems:   Compression fracture of spine (HCC)   Paraplegia at T4 level [facility])   Neurogenic bowel   Neurogenic bladder   Post-traumatic paraplegia   Acute complete paraplegia (HCC)     Functional Problem List: Nursing Behavior, Bladder, Bowel, Edema, Medication Management, Pain, Skin Integrity  PT Balance, Endurance, Motor, Pain, Safety, Sensory  OT Balance, Edema, Endurance, Motor, Pain, Safety, Sensory, Skin Integrity  SLP    TR         Basic ADL's: OT Grooming, Bathing, Dressing, Toileting     Advanced  ADL's: OT       Transfers: PT Bed Mobility, Bed to Chair, Car, Furniture, Civil Service fast streamer, Research scientist (life sciences): PT Ambulation, Psychologist, prison and probation services, Stairs     Additional Impairments: OT    SLP        TR      Anticipated Outcomes Item Anticipated Outcome  Self Feeding MOD I  Swallowing      Basic self-care  S UB dressing; MIN A LB dressing  Toileting  MOD A toileting; MIN A bathing   Bathroom Transfers MIN A  Bowel/Bladder  Max assist with bowel and bladder  Transfers  min A  Locomotion  min A at w/c level  Communication     Cognition     Pain  <4 on a 0-10 pain scale  Safety/Judgment  Max assist with transfer   Therapy Plan: PT Intensity: Minimum of 1-2 x/day ,45 to 90 minutes PT Frequency: 5 out of 7 days PT Duration Estimated Length of Stay: 4 weeks OT Intensity: Minimum of 1-2 x/day, 45 to 90 minutes OT Frequency: 5 out of 7 days OT Duration/Estimated Length of Stay: 4-4.5 weeks     Due to the current state of emergency, patients may not be receiving their 3-hours of Medicare-mandated therapy.   Team Interventions: Nursing Interventions  Patient/Family Education, Skin Care/Wound Management, Bladder Management, Bowel Management, Discharge Planning, Pain Management, Psychosocial Support, Medication Management  PT interventions Balance/vestibular training, Cognitive remediation/compensation, Community reintegration, Discharge planning, Disease management/prevention, DME/adaptive equipment instruction, Functional electrical stimulation, Functional mobility training, Neuromuscular re-education, Pain management, Patient/family education, Psychosocial support, Splinting/orthotics, Therapeutic Activities, Therapeutic Exercise, UE/LE Strength taining/ROM, UE/LE Coordination activities, Wheelchair propulsion/positioning  OT Interventions Balance/vestibular training, Discharge planning, Functional electrical stimulation, Pain management, Self Care/advanced ADL retraining, Therapeutic Activities, UE/LE Coordination activities, Cognitive remediation/compensation, Disease mangement/prevention, Functional mobility training, Patient/family education, Skin care/wound managment, Therapeutic Exercise, DME/adaptive equipment instruction, Community reintegration, Neuromuscular re-education, Psychosocial support, Splinting/orthotics, UE/LE Strength taining/ROM, Wheelchair propulsion/positioning  SLP Interventions    TR Interventions    SW/CM Interventions Discharge Planning, Patient/Family Education, Psychosocial Support   Barriers to Discharge MD  Medical stability, Home enviroment access/loayout, Incontinence, Neurogenic bowel and bladder, Lack of/limited family support, Weight, Weight bearing restrictions and new paraplegia  Nursing Inaccessible home environment, Wound Care, Lack of/limited family support, Decreased caregiver support, Medical stability, Home environment access/layout, Incontinence, Neurogenic Bowel & Bladder, Medication compliance    PT Medical stability, Home environment access/layout, Incontinence, Neurogenic Bowel & Bladder, Weight     OT Inaccessible home environment, Neurogenic Bowel & Bladder    SLP      SW       Team Discharge Planning: Destination: PT-Home ,OT-  Home , SLP-  Projected Follow-up: PT-Home health PT, 24 hour supervision/assistance, OT-  Home health OT, SLP-  Projected Equipment Needs: PT-Wheelchair (measurements), Wheelchair cushion (measurements), To be determined, OT-  , SLP-  Equipment Details: PT-TBD pending progress, OT-DAC Patient/family involved in discharge planning: PT- Patient, Family member/caregiver,  OT-Patient, SLP-   MD ELOS: 4 weeks Medical Rehab Prognosis:  Good Assessment: Pt is a 34 yr old male with new T4 ASIA A/complete paraplegia s/p  Cervical surgery/fusion with neurogenic bowel and bladder- will remove foley Monday- Has to be on Lovenox x 12 weeks. Also having L elbow pain- getting xrays.  Goals min to mod assist in 4 weeks due to paraplegia.     See Team Conference Notes for weekly updates to the plan of care

## 2019-05-13 NOTE — Progress Notes (Signed)
Orthopedic Tech Progress Note Patient Details:  [name] Ford 07-01-1985 [id number]  Patient ID: [name] Ford, male   DOB: Feb 14, 1986, 34 y.o.   MRN: [id number]   [name] Ford 05/13/2019, 2:19 PMRouted brace order to [facility].
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
## 2019-05-13 NOTE — Plan of Care (Signed)
  Problem: Consults Goal: RH SPINAL CORD INJURY PATIENT EDUCATION Description:  See Patient Education module for education specifics.  Outcome: Progressing Goal: Skin Care Protocol Initiated - if Braden Score 18 or less Description: If consults are not indicated, leave blank or document N/A Outcome: Progressing   Problem: SCI BOWEL ELIMINATION Goal: RH STG MANAGE BOWEL WITH ASSISTANCE Description: STG Manage Bowel with max Assistance. Outcome: Progressing Goal: RH STG SCI MANAGE BOWEL WITH MEDICATION WITH ASSISTANCE Description: STG SCI Manage bowel with medication with max assistance. Outcome: Progressing Goal: RH STG SCI MANAGE BOWEL PROGRAM W/ASSIST OR AS APPROPRIATE Description: STG SCI Manage bowel program w/ max assist or as appropriate. Outcome: Progressing   Problem: SCI BLADDER ELIMINATION Goal: RH STG MANAGE BLADDER WITH ASSISTANCE Description: STG Manage Bladder With max Assistance Outcome: Progressing   Problem: RH SKIN INTEGRITY Goal: RH STG SKIN FREE OF INFECTION/BREAKDOWN Description: Skin to remain free from breakdown with max assist while on rehab. Outcome: Progressing Goal: RH STG MAINTAIN SKIN INTEGRITY WITH ASSISTANCE Description: STG Maintain Skin Integrity With max Assistance. Outcome: Progressing Goal: RH STG ABLE TO PERFORM INCISION/WOUND CARE W/ASSISTANCE Description: STG Able To Perform Incision/Wound Care With max Assistance. Outcome: Progressing   Problem: RH SAFETY Goal: RH STG ADHERE TO SAFETY PRECAUTIONS W/ASSISTANCE/DEVICE Description: STG Adhere to Safety Precautions With max Assistance appropriate assistive Device. Outcome: Progressing   Problem: RH PAIN MANAGEMENT Goal: RH STG PAIN MANAGED AT OR BELOW PT'S PAIN GOAL Description: <4 on a 0-10 pain scale. Outcome: Progressing   Problem: RH KNOWLEDGE DEFICIT SCI Goal: RH STG INCREASE KNOWLEDGE OF SELF CARE AFTER SCI Description: Patient and caregiver will demonstrate knowledge of  medication management, bowel and bladder management, skin care management, and follow up care with the MD post discharge with min assist from staff. Outcome: Progressing

## 2019-05-13 NOTE — Progress Notes (Signed)
Physical Therapy Session Note  Patient Details  Name: [name] Ford MRN: [id number] Date of Birth: 20-Jan-1986  Today's Date: 05/13/2019 PT Individual Time: 5789-7847 PT Individual Time Calculation (min): 40 min   Short Term Goals: Week 1:  PT Short Term Goal 1 (Week 1): Pt will initiate OOB transfers PT Short Term Goal 2 (Week 1): Pt will tolerate sitting up in w/c x 1 hour PT Short Term Goal 3 (Week 1): Pt will initiate w/c mobility  Skilled Therapeutic Interventions/Progress Updates:   Pt received supine in bed and agreeable to PT. PT performed PROM to BLE for pain management and prevent contractures. Hip/knee flexion extension x 15 BLE with 6 sec hold at end range. HS stretch 3 x 2 min each with increasing overpressure as tolerated. Gastroc/soleus stretch 3 x 2 min with increased overpressure. Glute stretch to end range with 1 min overpressure x 2 Bil, PT educated wife and pt on importance of stretching to improve independence with transfers and mobility. PT applied PRAFO to BLE and educated wife on placement when pt in bed to prevent contracture in ankles. semireclined to long sitting x 2 with heavy use of bed rails to improve positioning in bed. Pt left supine in bed with call bell in reach and all needs met.        Therapy Documentation Precautions:  Precautions Precautions: Fall, Cervical Precaution Booklet Issued: No Precaution Comments: Reveiwed precautions with patient Required Braces or Orthoses: Cervical Brace Cervical Brace: Hard collar, At all times Restrictions Weight Bearing Restrictions: No Vital Signs: Therapy Vitals Temp: 98.2 F (36.8 C) Temp Source: Oral Pulse Rate: 67 Resp: 18 BP: 123/66 Patient Position (if appropriate): Lying Oxygen Therapy SpO2: 100 % O2 Device: Room Air Pain: Pain Assessment Pain Scale: 0-10 Pain Score: 9  Pain Type: Acute pain Pain Location: Generalized Pain Orientation: Posterior Pain Descriptors / Indicators:  Aching;Stabbing;Discomfort Pain Onset: On-going Patients Stated Pain Goal: 0 Pain Intervention(s): RN made aware;Repositioned;Emotional support    Therapy/Group: Individual Therapy  Lorie Phenix 05/13/2019, 4:48 PM

## 2019-05-14 ENCOUNTER — Inpatient Hospital Stay (HOSPITAL_COMMUNITY): Payer: Self-pay | Admitting: Physical Therapy

## 2019-05-14 ENCOUNTER — Inpatient Hospital Stay (HOSPITAL_COMMUNITY): Payer: Self-pay | Admitting: Occupational Therapy

## 2019-05-14 DIAGNOSIS — S24112A Complete lesion at T2-T6 level of thoracic spinal cord, initial encounter: Secondary | ICD-10-CM
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
## 2019-05-14 NOTE — Progress Notes (Addendum)
Suppository given by day rn, Patient didn't have any bowel movement yet. Educated  patient on digital stimulation and that rn needed to perform so he can have BM. Patient initially refused and stated that the day rn already did it. Patient then asked permission from his GF if he should do it and he then agreed. Dig stimulation performed 3 times with intervals. Vault feels empty. Patient only had smears.   3/21 Patient had a large liquid consistency BM at 230 am.

## 2019-05-14 NOTE — Plan of Care (Signed)
  Problem: Consults Goal: RH SPINAL CORD INJURY PATIENT EDUCATION Description:  See Patient Education module for education specifics.  Outcome: Progressing   Problem: SCI BOWEL ELIMINATION Goal: RH STG MANAGE BOWEL WITH ASSISTANCE Description: STG Manage Bowel with max Assistance. Outcome: Progressing Goal: RH STG SCI MANAGE BOWEL WITH MEDICATION WITH ASSISTANCE Description: STG SCI Manage bowel with medication with max assistance. Outcome: Progressing   Problem: SCI BLADDER ELIMINATION Goal: RH STG MANAGE BLADDER WITH ASSISTANCE Description: STG Manage Bladder With max Assistance Outcome: Progressing   Problem: RH SAFETY Goal: RH STG ADHERE TO SAFETY PRECAUTIONS W/ASSISTANCE/DEVICE Description: STG Adhere to Safety Precautions With max Assistance appropriate assistive Device. Outcome: Progressing

## 2019-05-14 NOTE — Progress Notes (Signed)
Physical Therapy Session Note  Patient Details  Name: [name] Ford MRN: [id number] Date of Birth: 1985/10/21  Today's Date: 05/14/2019 PT Individual Time: 9774-1423 PT Individual Time Calculation (min): 75 min   Short Term Goals: Week 1:  PT Short Term Goal 1 (Week 1): Pt will initiate OOB transfers PT Short Term Goal 2 (Week 1): Pt will tolerate sitting up in w/c x 1 hour PT Short Term Goal 3 (Week 1): Pt will initiate w/c mobility  Skilled Therapeutic Interventions/Progress Updates:    Patient received in bed, received pain medication earlier this afternoon and for the most part was calm and cooperative with PT session this afternoon. Spent quite a bit of time stretching and ranging BLEs in bed, then progressed to practicing rolling- initially needed MinAx2 with Max cues for sequencing, however with repeated practice able to progress to MinAx1 person and use of railing to roll onto his L side! Continues to require 2 person totalA assist for all supine to sit/sit to supine transfers today; tolerated sitting at EOB for really just 3-4 minutes on two attempts before becoming dizzy and requesting to lay back down. Took orthostatics on second attempt at sitting EOB- BP and HR remained WNL with no orthostatic pressures or hypotension noted. Refused transfer practice or OOB to chair due to having pain after sitting up in chair for two hours earlier today- educated him about calling for help getting back to bed before letting his pain get that high, also suggested trying sitting up for just one hour on next day of therapy to try to build upright tolerance without increasing pain. Needed quite a bit of encouragement throughout session- he was cooperative but annoyed with his cervical collar and stating "you're all doing the same thing, I need something different, do something different", also stating "I'm injured, you're pushing too fast!". Lots of support, encouragement, and education given on importance of  mastering basics given his new SCI and importance of intense therapies to help him return home at a maximally independent level; fiance present and assisted in encouraging patient. Ended session with tilting in Kreg bed- this part of session was somewhat time limited, but tolerated 25 degrees of tilt well for 3 minutes, and also tolerated staying at 35 degrees well for 2 additional minutes- will likely benefit from session spending more time on just tilting in future. He was left positioned to comfort in bed with all needs met this afternoon.   Therapy Documentation Precautions:  Precautions Precautions: Fall, Cervical Precaution Booklet Issued: No Precaution Comments: Reveiwed precautions with patient Required Braces or Orthoses: Cervical Brace Cervical Brace: Hard collar, At all times Restrictions Weight Bearing Restrictions: No Pain: Pain Assessment Pain Scale: Faces Pain Score: 7  Faces Pain Scale: Hurts a little bit Pain Type: Acute pain Pain Location: Neck Pain Orientation: Posterior Pain Descriptors / Indicators: Aching Pain Frequency: Constant Pain Onset: On-going Patients Stated Pain Goal: 0 Pain Intervention(s): Distraction;Repositioned;Ambulation/increased activity Multiple Pain Sites: No    Therapy/Group: Individual Therapy   Windell Norfolk, DPT, PN1   Supplemental Physical Therapist [place]    Pager [phone] Acute Rehab Office [phone]    05/14/2019, 4:01 PM

## 2019-05-14 NOTE — Progress Notes (Signed)
Occupational Therapy Session Note  Patient Details  Name: Emrik Erhard MRN: [id number] Date of Birth: 05-10-1985  Today's Date: 05/14/2019 OT Individual Time: 0900-1000 OT Individual Time Calculation (min): 60 min    Short Term Goals: Week 1:  OT Short Term Goal 1 (Week 1): Pt will maintain sitting balance with max A of 1 caregiver for 5 min to demo improved balance OT Short Term Goal 2 (Week 1): Pt will complete 2/4 steps of UB dressing with S OT Short Term Goal 3 (Week 1): Pt will complete transfer to w/c wiht MAX A of 1 caregiver in prep for [facility] transfer  Skilled Therapeutic Interventions/Progress Updates:    1:1 Pt in bed when arrived and ready to go. Focus on education and being able to rely to caregiver how to assist with LEs. PT able to roll with mod A with A for LB. TEDS and pants donned with total A +2. BP WFL before sitting up and remained in sitting position. Ortho MD, Carola Frost came in and reported that pt can use left UE at he tolerates and will not splint it. Full elbow extension with palm face up with be difficult move. OT followed up with rehab MD. Pt able ot maintain static sitting balance at EOB with supervision with bilateral UE support. 3 inch step used under his feet for support due to elevated bed height. Education and demonstrated use of slide board and taught head/ hip ratio. PT able to transfer to the right into w/c with mod A +2 with the ability to use both UEs into a tilt in space w/c. Pt reported comfortable in w/c. Able to perform grooming at the sink with setup of items. Educated on maintaining hips at neutral and used soft gait belt to keep hips at neutral instead of abducted. Also provided education to pt and pt's tech about pressure relief while up in the w/c.   Left pt resting tilted back in w/c resting with needs close to him.     Therapy Documentation Precautions:  Precautions Precautions: Fall, Cervical Precaution Booklet Issued: No Precaution Comments:  Reveiwed precautions with patient Required Braces or Orthoses: Cervical Brace Cervical Brace: Hard collar, At all times Restrictions Weight Bearing Restrictions: No General:   Vital Signs: Therapy Vitals Pulse Rate: 78 BP: 112/75 Patient Position (if appropriate): Sitting Pain: Reported upper back pain but with sitting up and out of bed pt reports pain has improved. Also made adjustments to collar as requested  Therapy/Group: Individual Therapy  Roney Mans [facility] 05/14/2019, 10:38 AM

## 2019-05-15 ENCOUNTER — Inpatient Hospital Stay (HOSPITAL_COMMUNITY): Payer: Self-pay

## 2019-05-15 MED ORDER — OXYCODONE HCL ER 10 MG PO T12A
10.0000 mg | EXTENDED_RELEASE_TABLET | Freq: Two times a day (BID) | ORAL | Status: DC
  Administered 2019-05-15 – 2019-05-17 (×5): 10 mg via ORAL
  Filled 2019-05-15 (×6): qty 1

## 2019-05-15 NOTE — Progress Notes (Signed)
Patient c/o thermostat in room being hot and wants his door opened.  NT went to open his door wide open as per request. Patient called again and c/o the same thing a minute after. Rn came in and turned on his fan and explained to him that the thermostat is not working properly and facilities came early yesterday morning to check it but was unable to fix it. Charge nurse was aware of it and patient was given an option to transfer room but he refused. Patient then asked when his next pain medication will be and he was told that it will be around 145 am. Patient then started complaining about his earlier pain medication that was held against him since RN only gave him 1 oxyIR instead of 2. Patient's HR was 59 at that time and it was explained to the patient that it might cause his HR to drop more. Steffanie Rainwater was also on the phone questioning the RN at that time. RN explained to the patient that  pharmacy advised yesterday to start with 1/1 percocet and oxyir and increase to 2/2 afterwards.   RN requested charge rn to see if she can come and talk to the patient as he was getting a little agitated. Patient calmed down after talking to charge rn.

## 2019-05-15 NOTE — Progress Notes (Signed)
Physical Therapy Session Note  Patient Details  Name: [name] Ford MRN: [id number] Date of Birth: 05-22-1985  Today's Date: 05/15/2019 PT Individual Time: 0827-0900 PT Individual Time Calculation (min): 33 min   Short Term Goals: Week 1:  PT Short Term Goal 1 (Week 1): Pt will initiate OOB transfers PT Short Term Goal 2 (Week 1): Pt will tolerate sitting up in w/c x 1 hour PT Short Term Goal 3 (Week 1): Pt will initiate w/c mobility  Skilled Therapeutic Interventions/Progress Updates: Pt presents semi-reclined in bed, c/o pain and needing pain meds.  Pt encouraged to participate w/ therapy and will after receiving pain meds.  Pt tolerated passive stretches to bilateral LEs, w/ long holds at end-ranges to all joints.  Pt c/o discomfort to incision/staples and performed rolling bilateral directions using side rails and min A, w/ assist to flex contralateral knee to roll.  Pt able to hold w/ min to mod A to re-position pillows.  Pt bed elevated and utilized BUEs on bed rails as well as PT hand held to lean forward.  Pt states relief w/ movement.  Pt left semi-reclined in bed w/ all needs in place.  Total missed time of 27' 2/2 refusal 2/2 pain.     Therapy Documentation Precautions:  Precautions Precautions: Fall, Cervical Precaution Booklet Issued: No Precaution Comments: Reveiwed precautions with patient Required Braces or Orthoses: Cervical Brace Cervical Brace: Hard collar, At all times Restrictions Weight Bearing Restrictions: No General: PT Amount of Missed Time (min): 27 Minutes PT Missed Treatment Reason: Other (Comment)(pt mc/o 10/10 pain and unwilling to participate until pain meds givn) Vital Signs:   Pain:  10/10 pain to back, meds given before agreeing to participate.    Therapy/Group: Individual Therapy  Lucio Edward 05/15/2019, 10:40 AM

## 2019-05-15 NOTE — Progress Notes (Signed)
Patient attempted to refuse dressing change and CHG bath. Significant other encouraged patient to cooperate. Patient then agreed to to allow foley care, CHG bath and dressing change on back.

## 2019-05-15 NOTE — Plan of Care (Signed)
  Problem: Consults Goal: RH SPINAL CORD INJURY PATIENT EDUCATION Description:  See Patient Education module for education specifics.  Outcome: Progressing Goal: Skin Care Protocol Initiated - if Braden Score 18 or less Description: If consults are not indicated, leave blank or document N/A Outcome: Progressing   Problem: SCI BOWEL ELIMINATION Goal: RH STG MANAGE BOWEL WITH ASSISTANCE Description: STG Manage Bowel with max Assistance. Outcome: Progressing Goal: RH STG SCI MANAGE BOWEL WITH MEDICATION WITH ASSISTANCE Description: STG SCI Manage bowel with medication with max assistance. Outcome: Progressing Goal: RH STG SCI MANAGE BOWEL PROGRAM W/ASSIST OR AS APPROPRIATE Description: STG SCI Manage bowel program w/ max assist or as appropriate. Outcome: Progressing   Problem: SCI BLADDER ELIMINATION Goal: RH STG MANAGE BLADDER WITH ASSISTANCE Description: STG Manage Bladder With max Assistance Outcome: Progressing   Problem: RH SKIN INTEGRITY Goal: RH STG SKIN FREE OF INFECTION/BREAKDOWN Description: Skin to remain free from breakdown with max assist while on rehab. Outcome: Progressing
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
## 2019-05-16 ENCOUNTER — Inpatient Hospital Stay (HOSPITAL_COMMUNITY): Payer: Self-pay | Admitting: Physical Therapy

## 2019-05-16 ENCOUNTER — Inpatient Hospital Stay (HOSPITAL_COMMUNITY): Payer: Self-pay

## 2019-05-16 ENCOUNTER — Inpatient Hospital Stay (HOSPITAL_COMMUNITY): Payer: Self-pay | Admitting: Occupational Therapy

## 2019-05-16 LAB — BASIC METABOLIC PANEL
Anion gap: 13 (ref 5–15)
BUN: 24 mg/dL — ABNORMAL HIGH (ref 6–20)
CO2: 23 mmol/L (ref 22–32)
Calcium: 9.2 mg/dL (ref 8.9–10.3)
Chloride: 100 mmol/L (ref 98–111)
Creatinine, Ser: 1.07 mg/dL (ref 0.61–1.24)
GFR calc Af Amer: >60 mL/min (ref >60)
GFR calc non Af Amer: >60 mL/min (ref >60)
Glucose, Bld: 103 mg/dL — ABNORMAL HIGH (ref 70–99)
Potassium: 4.3 mmol/L (ref 3.5–5.1)
Sodium: 136 mmol/L (ref 135–145)

## 2019-05-16 LAB — CBC
HCT: 29.9 % — ABNORMAL LOW (ref 39.0–52.0)
Hemoglobin: 9.9 g/dL — ABNORMAL LOW (ref 13.0–17.0)
MCH: 30.9 pg (ref 26.0–34.0)
MCHC: 33.1 g/dL (ref 30.0–36.0)
MCV: 93.4 fL (ref 80.0–100.0)
Platelets: 418 10*3/uL — ABNORMAL HIGH (ref 150–400)
RBC: 3.2 MIL/uL — ABNORMAL LOW (ref 4.22–5.81)
RDW: 13.1 % (ref 11.5–15.5)
WBC: 11.6 10*3/uL — ABNORMAL HIGH (ref 4.0–10.5)
nRBC: 0 % (ref 0.0–0.2)
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
## 2019-05-16 NOTE — Progress Notes (Signed)
Physical Therapy Session Note  Patient Details  Name: [name] Ford MRN: [id number] Date of Birth: Jul 25, 1985  Today's Date: 05/16/2019 PT Individual Time: 1100-1200 PT Individual Time Calculation (min): 60 min   Short Term Goals: Week 1:  PT Short Term Goal 1 (Week 1): Pt will initiate OOB transfers PT Short Term Goal 2 (Week 1): Pt will tolerate sitting up in w/c x 1 hour PT Short Term Goal 3 (Week 1): Pt will initiate w/c mobility  Skilled Therapeutic Interventions/Progress Updates:    Pt received semi-reclined in bed, agreeable to PT session. Pt reports some discomfort in his upper back and posterior neck region, not rated. Pt reports being premedicated prior to start of therapy session and reports feeling tired due to pain medication. Pt dependent to don shoes at bed level. Semi-reclined BP 127/84. Semi-reclined to sitting EOB with assist x 2. Pt reports 4/10 dizziness while seated EOB. Seated BP 124/81 with use of B thigh high TEDs. Reviewed LUE WBAT orders with patient. Slide board transfer bed to w/c with assist x 2 with 4" step under BLE for support. Pt reports feeling SOA once seated in w/c with some sweating and feeling of hard collar being too tight. Adjusted collar, seated BP 105/81 and symptoms resolve. Dependent transport via TIS w/c to/from therapy gym. Adjusted head support on w/c for improved comfort and positioning. Seated tolerance in w/c x 15 min while engaging in Wii Sports golfing with use of RUE. Encouraged pt to remain seated in w/c at end of session, pt agreeable. Pt left reclined in TIS w/c with needs in reach, safety belt in place, fiance present in room at end of session.  Therapy Documentation Precautions:  Precautions Precautions: Fall, Cervical Precaution Booklet Issued: No Precaution Comments: Reveiwed precautions with patient Required Braces or Orthoses: Cervical Brace Cervical Brace: Hard collar, At all times Restrictions Weight Bearing Restrictions:  No    Therapy/Group: Individual Therapy   Peter Congo, PT, DPT  05/16/2019, 12:36 PM
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
## 2019-05-16 NOTE — Progress Notes (Signed)
Occupational Therapy Session Note  Patient Details  Name: [name] Ford MRN: [id number] Date of Birth: 10-Oct-1985  Today's Date: 05/16/2019 OT Individual Time: 0900-1000 OT Individual Time Calculation (min): 60 min    Short Term Goals: Week 1:  OT Short Term Goal 1 (Week 1): Pt will maintain sitting balance with max A of 1 caregiver for 5 min to demo improved balance OT Short Term Goal 2 (Week 1): Pt will complete 2/4 steps of UB dressing with S OT Short Term Goal 3 (Week 1): Pt will complete transfer to w/c wiht MAX A of 1 caregiver in prep for [facility] transfer  Skilled Therapeutic Interventions/Progress Updates:    Pt resting in bed upon arrival.  Pt does not have any clean clothing but wanted to don pants from previous day.  Pt inquired about pads for cervical collar.  New pads located in room but missing back front portion.  Upon removal of collar it was noted that pads had been incorrectly placed on collar.  Pads changed and positioned correctly. Cervical collar donned and adjusted for comfort. Pt incontinent of bowel and required tot A+2 for hygiene and donning clean brief and pants after Ted hose donned. Pt required mod A+2 for rolling in bed.  Pt required max A+1 for repositioning into unsupported sitting. Continued education regarding bowel program, repositioning, and BADLs. Pt remained in bed with all needs within reach.   Therapy Documentation Precautions:  Precautions Precautions: Fall, Cervical Precaution Booklet Issued: No Precaution Comments: Reveiwed precautions with patient Required Braces or Orthoses: Cervical Brace Cervical Brace: Hard collar, At all times Restrictions Weight Bearing Restrictions: No   Pain: Pain Assessment Pain Scale: 0-10 Pain Score: 7  Pain Location: Neck Pain Descriptors / Indicators: Aching Pain Intervention(s): pads changed in cervical collar and repositioned  Therapy/Group: Individual Therapy  [name] 05/16/2019, 10:01 AM

## 2019-05-16 NOTE — Progress Notes (Signed)
Bowel program completed, pt had medium size bowel movement that was liquid consistency. Pt tolerated suppository and dig. Stim.No other problems to report at this time.

## 2019-05-16 NOTE — Plan of Care (Signed)
  Problem: Consults Goal: RH SPINAL CORD INJURY PATIENT EDUCATION Description:  See Patient Education module for education specifics.  Outcome: Progressing   Problem: SCI BOWEL ELIMINATION Goal: RH STG MANAGE BOWEL WITH ASSISTANCE Description: STG Manage Bowel with max Assistance. Outcome: Progressing Goal: RH STG SCI MANAGE BOWEL WITH MEDICATION WITH ASSISTANCE Description: STG SCI Manage bowel with medication with max assistance. Outcome: Progressing   Problem: SCI BLADDER ELIMINATION Goal: RH STG MANAGE BLADDER WITH ASSISTANCE Description: STG Manage Bladder With max Assistance Outcome: Progressing   Problem: RH SKIN INTEGRITY Goal: RH STG MAINTAIN SKIN INTEGRITY WITH ASSISTANCE Description: STG Maintain Skin Integrity With max Assistance. Outcome: Progressing

## 2019-05-16 NOTE — Progress Notes (Signed)
Occupational Therapy Session Note  Patient Details  Name: [name] Ford MRN: [id number] Date of Birth: 11-Feb-1986  Today's Date: 05/16/2019 OT Individual Time: 1420-1435 OT Individual Time Calculation (min): 15 min  60 minutes missed  Short Term Goals: Week 1:  OT Short Term Goal 1 (Week 1): Pt will maintain sitting balance with max A of 1 caregiver for 5 min to demo improved balance OT Short Term Goal 2 (Week 1): Pt will complete 2/4 steps of UB dressing with S OT Short Term Goal 3 (Week 1): Pt will complete transfer to w/c wiht MAX A of 1 caregiver in prep for [facility] transfer     Skilled Therapeutic Interventions/Progress Updates:    Pt greeted in bed, appearing lethargic and at one point seeming to nod off while OT was talking to him. Pt stated that he felt too tired to talk right now, but willing to participate in therapy. Suggested transfer OOB with pt refusing, also refusing bedlevel tx but when asked again this was ok. HOB elevated per his tolerance and instructed pt through therapeutic exercises using 2# bar. Pt once again nodding off. Girlfriend at bedside suggested having all of his therapies at once vs spread out to increase his participation. Discussed how having back to back therapies is very intense and would most likely result in more fatigue, with spreading out therapies pt had the opportunity to rest as needed. Pt reported it didn't matter when his therapies were, he was just tired and didn't want to participate. Time missed due to pt refusal. With pt consent, OT placed lavender scented cotton balls in his pillowcase to increase relaxation during rest.   Therapy Documentation Precautions:  Precautions Precautions: Fall, Cervical Precaution Booklet Issued: No Precaution Comments: Reveiwed precautions with patient Required Braces or Orthoses: Cervical Brace Cervical Brace: Hard collar, At all times Restrictions Weight Bearing Restrictions: No Vital Signs: Therapy Vitals Temp:  (!) 97.5 F (36.4 C) Temp Source: Oral Pulse Rate: (!) 57 Resp: 18 BP: 129/79 Patient Position (if appropriate): Lying Oxygen Therapy SpO2: 100 % O2 Device: Room Air ADL:     Therapy/Group: Individual Therapy  Anyely Cunning A Blessing Ozga 05/16/2019, 2:51 PM

## 2019-05-16 NOTE — Progress Notes (Signed)
Foley catheter removed at approximately 1330. Approximately 9 cc clear fluid removed from balloon. Patient tolerated well.

## 2019-05-17 ENCOUNTER — Inpatient Hospital Stay (HOSPITAL_COMMUNITY): Payer: Self-pay | Admitting: Physical Therapy

## 2019-05-17 ENCOUNTER — Inpatient Hospital Stay (HOSPITAL_COMMUNITY): Payer: Self-pay | Admitting: Occupational Therapy

## 2019-05-17 NOTE — Progress Notes (Signed)
Physical Therapy Session Note  Patient Details  Name: [name] Ford MRN: [id number] Date of Birth: 02/09/1986  Today's Date: 05/17/2019 PT Individual Time: 8469-6295 PT Individual Time Calculation (min): 51 min   and  Today's Date: 05/17/2019 PT Missed Time: 24 Minutes Missed Time Reason: Other (Comment)(EKG and medical instability)  Short Term Goals: Week 1:  PT Short Term Goal 1 (Week 1): Pt will initiate OOB transfers PT Short Term Goal 2 (Week 1): Pt will tolerate sitting up in w/c x 1 hour PT Short Term Goal 3 (Week 1): Pt will initiate w/c mobility  Skilled Therapeutic Interventions/Progress Updates:    Pt received supine in bed, asleep with [name] present. Upon awakening, pt agreeable to therapy session with encouragement. Supine vitals: BP 125/62 (MAP 79), HR 107bpm Therapist donned B LE TED hose, socks, and shoes total assist Therapist educated pt/family on purpose of TED hose. Rolled R/L in bed with +2 max assist to complete donning of TEDS - cuing for sequencing with total assist for B LE management. Supine>sit L EOB with +2 total assist for B LE management and trunk upright. Upon sitting on EOB therapist pushed button to start BP reading when pt became non-responsive with fixed gaze and started snorting/heavy breathing with drool noted to be coming from R side of pt's mouth (approximately 10seconds) - immediately returned pt to supine with +2 total assist and RN alerted. Once in supine, ~5 seconds later pt became alert appearing confused and unaware of what had just occurred. Vital sign reading while sitting EOB and upon returning to supine: BP 144/100 (MAP 114), HR 165bpm. Reassessed vitals in supine: BP 140/80 (MAP 95), HR 63bpm, SpO2 97%. RN reports pt was in/out cathed at approximately 12:30pm. Therapist doffed shoes, socks, and TED hose and reassessed vitals in supine: BP 116/69 (MAP 82), HR 100bpm, SpO2 100%. Therapist reapplied TED hose while in supine and reassessed BP  to determine correlation: BP 126/85 (MAP 97), HR 87bpm, SpO2 99% then again a few minutes later BP 131/80 (MAP 93), HR 86bpm, SpO2 100% Technician arrived to perform EKG. RN notified of these events as well as pt's primary PT, [name], and Dr. Berline Chough. Pt left supine in bed with TED hose doffed, pillows supporting extremities, pt's HOB elevated to 28degrees per his request, and his fiance present. Missed 24 minutes of skilled physical therapy.  Therapy Documentation Precautions:  Precautions Precautions: Fall, Cervical Precaution Booklet Issued: No Precaution Comments: Reveiwed precautions with patient Required Braces or Orthoses: Cervical Brace Cervical Brace: Hard collar, At all times Restrictions Weight Bearing Restrictions: No  Vital Signs: Details above.  Pain: Reports he is having neck pain from earlier PT session but unrated.    Therapy/Group: Individual Therapy  Ginny Forth, PT, DPT 05/17/2019, 1:03 PM

## 2019-05-17 NOTE — Plan of Care (Signed)
  Problem: Consults Goal: RH SPINAL CORD INJURY PATIENT EDUCATION Description:  See Patient Education module for education specifics.  Outcome: Progressing Goal: Skin Care Protocol Initiated - if Braden Score 18 or less Description: If consults are not indicated, leave blank or document N/A Outcome: Progressing   Problem: SCI BOWEL ELIMINATION Goal: RH STG MANAGE BOWEL WITH ASSISTANCE Description: STG Manage Bowel with max Assistance. Outcome: Progressing Goal: RH STG SCI MANAGE BOWEL WITH MEDICATION WITH ASSISTANCE Description: STG SCI Manage bowel with medication with max assistance. Outcome: Progressing Goal: RH STG SCI MANAGE BOWEL PROGRAM W/ASSIST OR AS APPROPRIATE Description: STG SCI Manage bowel program w/ max assist or as appropriate. Outcome: Progressing   Problem: SCI BLADDER ELIMINATION Goal: RH STG MANAGE BLADDER WITH ASSISTANCE Description: STG Manage Bladder With max Assistance Outcome: Progressing   Problem: RH SKIN INTEGRITY Goal: RH STG SKIN FREE OF INFECTION/BREAKDOWN Description: Skin to remain free from breakdown with max assist while on rehab. Outcome: Progressing Goal: RH STG MAINTAIN SKIN INTEGRITY WITH ASSISTANCE Description: STG Maintain Skin Integrity With max Assistance. Outcome: Progressing Goal: RH STG ABLE TO PERFORM INCISION/WOUND CARE W/ASSISTANCE Description: STG Able To Perform Incision/Wound Care With max Assistance. Outcome: Progressing   Problem: RH SAFETY Goal: RH STG ADHERE TO SAFETY PRECAUTIONS W/ASSISTANCE/DEVICE Description: STG Adhere to Safety Precautions With max Assistance appropriate assistive Device. Outcome: Progressing   Problem: RH PAIN MANAGEMENT Goal: RH STG PAIN MANAGED AT OR BELOW PT'S PAIN GOAL Description: <4 on a 0-10 pain scale. Outcome: Progressing   Problem: RH KNOWLEDGE DEFICIT SCI Goal: RH STG INCREASE KNOWLEDGE OF SELF CARE AFTER SCI Description: Patient and caregiver will demonstrate knowledge of  medication management, bowel and bladder management, skin care management, and follow up care with the MD post discharge with min assist from staff. Outcome: Progressing   
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
## 2019-05-17 NOTE — Progress Notes (Addendum)
No incontinent bowel episodes this shift. Patient cathed q6h at 1230 400cc and at 1815 500cc Bowel program started about 1830. Patient positioned on left side- dig stim done for approximately 15 seconds. Rectal vault clear. Suppository inserted about 1840. At approximately 1900 dig stim performed again for about 15-20seconds. No results and rectal vault clear. Patient left in side lying with pillow support- girlfriend at bedside. Reported off to night shift nurse to finish bowel program.
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
## 2019-05-17 NOTE — Progress Notes (Signed)
Nurse called to room-per therapy patient with unresponsive episode after being sat up on edge of bed. Episode lasted approximately 10-15 seconds. Therapy reporting patient was making "grunitng" noises and eyes fixated. Upon entering room patient lying back in bed- alert and talking. Delle Reining PA made aware. Last BP 116/69, HR 100, Pulse ox 99.  New order for EKG. Continue to monitor.

## 2019-05-17 NOTE — Progress Notes (Signed)
Physical Therapy Session Note  Patient Details  Name: [name] Ford MRN: [id number] Date of Birth: [birth year]  Today's Date: 05/17/2019 PT Individual Time: 0800-0900; 1135-1200 PT Individual Time Calculation (min): 60 min and 25 min  Short Term Goals: Week 1:  PT Short Term Goal 1 (Week 1): Pt will initiate OOB transfers PT Short Term Goal 2 (Week 1): Pt will tolerate sitting up in w/c x 1 hour PT Short Term Goal 3 (Week 1): Pt will initiate w/c mobility  Skilled Therapeutic Interventions/Progress Updates:    Session 1: Pt received seated in bed, agreeable with encouragement to participate in therapy session. Encouraged pt to perform transfer to w/c, pt declines any OOB mobility this AM 2/2 pain and fatigue. Pt reports feeling uncomfortable while seated in TIS, unwilling to problem solve solutions. Encouraged pt to spend more time sitting up for improved upright tolerance and overall recovery. Pt reports he is attempting to decrease his intake of pain medication but also with frequent complaints of neck and upper back pain during session. Pt frequently uncomfortable and adjusting hard collar. Pt requesting collar tightened and then loosened, unable to find a comfortable position. Pt agreeable to bed level bathing and dressing. Pt is setup A for UB dressing, min A to doff hospital gown and don new gown. Pt is dependent to don TED hose and shoes. Pt reports feeling "hot" and that clothing is constricting while seated in bed. Semi-reclined BP 137/91 (105). Education with patient about AD and possible causes including tight clothing, bowel/bladder issues, etc. Per RN pt had I/O cath performed at 0600 and per pt he had a BM the previous night. Removed TED hose due to ongoing discomfort and slightly elevated BP. Seated BP in bed with no TED hose: 133/93. Pt has decrease in symptoms of discomfort. Pt is assist x 2 for rolling R/L in order to don pants dependently. Pt left seated in bed with needs in reach,  setup toothbrush etc at bedside to pt can perform oral care independently.  Session 2: Pt received seated in bed with bed in chair position eating a sandwich his fiance brought for him. With encouragement pt agreeable to sit up to EOB to attempt to eat sandwich. Per OT from previous session pt declined to re-don TEDs. Semi-reclined to sitting EOB x 2 for BLE management and trunk control. Pt loses balance posteriorly, assisted back to sitting with assist x 2 for trunk control. Pt unable to maintain sitting balance without max A this session. Pt then breaks out in a sweat and exhibits labored breathing, unable to maintain sitting balance without assist x 2 and becomes verbally unresponsive. Pressed emergency alert button and pt's RN arrives. Pt assisted back to supine dependently x 2. Once in supine sweating resolves and BP obtained: 135/96. Pt returned to seated position via use of hospital bed features. Once pt seated in bed reports feeling more "normal". Pt left in care of RN at end of session.  Therapy Documentation Precautions:  Precautions Precautions: Fall, Cervical Precaution Booklet Issued: No Precaution Comments: Reveiwed precautions with patient Required Braces or Orthoses: Cervical Brace Cervical Brace: Hard collar, At all times Restrictions Weight Bearing Restrictions: No    Therapy/Group: Individual Therapy   Peter Congo, PT, DPT  05/17/2019, 10:58 AM

## 2019-05-17 NOTE — Progress Notes (Addendum)
Orthopedic Tech Progress Note Patient Details:  [name] Ford 08/10/1985 [id number] Received call telling me to bring ab binder from RN Ortho Devices Type of Ortho Device: Abdominal binder Ortho Device/Splint Location: Stomach Ortho Device/Splint Interventions: Application   Post Interventions Patient Tolerated: Well Instructions Provided: Care of device   [name] Ford 05/17/2019, 4:57 PM

## 2019-05-17 NOTE — Progress Notes (Signed)
Occupational Therapy Session Note  Patient Details  Name: [name] Ford MRN: [id number] Date of Birth: 1985-12-24  Today's Date: 05/17/2019 OT Individual Time: 0930-1000 OT Individual Time Calculation (min): 30 min    Short Term Goals: Week 1:  OT Short Term Goal 1 (Week 1): Pt will maintain sitting balance with max A of 1 caregiver for 5 min to demo improved balance OT Short Term Goal 2 (Week 1): Pt will complete 2/4 steps of UB dressing with S OT Short Term Goal 3 (Week 1): Pt will complete transfer to w/c wiht MAX A of 1 caregiver in prep for [facility] transfer  Skilled Therapeutic Interventions/Progress Updates:    1:1 Pt in bed when arrived. Pt was on his phone with ear piece and was on the phone for entire the session. Pt BP was 135/ 88 at the beginning of session. Pt declined donned TEDS in session reporting they are too much. Came to EOB with max A +2 with extra time due to pt being on the phone. Pt required max A to maintain balance. BP 96/66 and symptotic. Pt return to supine and bed put into full chair positioning with lumbar support.  BP was 137/93.  Pt declined sitting in the tilt in space w/c due to too much work to get ou of chair with lift. Made a plan to transfer into recliner and then out with next Pt session but BP dropped and stayed in the bed.   Therapy Documentation Precautions:  Precautions Precautions: Fall, Cervical Precaution Booklet Issued: No Precaution Comments: Reveiwed precautions with patient Required Braces or Orthoses: Cervical Brace Cervical Brace: Hard collar, At all times Restrictions Weight Bearing Restrictions: No \\Pain : Pain Assessment Pain Scale: 0-10 Pain Score: 8  Pain Type: Acute pain Pain Location: Neck Pain Descriptors / Indicators: Aching Pain Onset: On-going Pain Intervention(s): Repositioned    Therapy/Group: Individual Therapy  Roney Mans [facility] 05/17/2019, 3:45 PM

## 2019-05-18 ENCOUNTER — Inpatient Hospital Stay (HOSPITAL_COMMUNITY): Payer: Self-pay | Admitting: Occupational Therapy

## 2019-05-18 ENCOUNTER — Inpatient Hospital Stay (HOSPITAL_COMMUNITY): Payer: Medicaid Other

## 2019-05-18 ENCOUNTER — Encounter (HOSPITAL_COMMUNITY): Payer: Self-pay

## 2019-05-18 ENCOUNTER — Inpatient Hospital Stay (HOSPITAL_COMMUNITY): Payer: Self-pay

## 2019-05-18 ENCOUNTER — Inpatient Hospital Stay (HOSPITAL_COMMUNITY): Payer: Self-pay | Admitting: Physical Therapy

## 2019-05-18 MED ORDER — SENNA 8.6 MG PO TABS
2.0000 | ORAL_TABLET | Freq: Two times a day (BID) | ORAL | Status: DC
  Administered 2019-05-18: 17.2 mg via ORAL
  Filled 2019-05-18: qty 2
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
## 2019-05-18 NOTE — Progress Notes (Signed)
Physical Therapy Session Note  Patient Details  Name: [name] Ford MRN: [id number] Date of Birth: 1985-07-02  Today's Date: 05/18/2019 PT Individual Time: 5809-9833 PT Individual Time Calculation (min): 38 min    Short Term Goals: Week 1:  PT Short Term Goal 1 (Week 1): Pt will initiate OOB transfers PT Short Term Goal 2 (Week 1): Pt will tolerate sitting up in w/c x 1 hour PT Short Term Goal 3 (Week 1): Pt will initiate w/c mobility  Skilled Therapeutic Interventions/Progress Updates:    Pt supine in bed upon PT arrival, agreeable to therapy tx and denies pain at rest - he reports he had pain medication recently and feels a little tired. Pt reports he has already been up a lot today, requires participation for participation. Therapist performed hamstring and heel cord stretching x 1 min bilaterally. Pt transferred supine<>sitting this session with max assist +2 for LE management and trunk control. In sitting pt performed lateral scooting x 2 in each direction with max +2 assist and facilitation/cues for weightshifting. In sitting pt performed static reaching balance without UE support, emphasis on finding "balance point" to maintain static sitting without assist - supervision to min assist needed for this. Pt worked on dynamic reaching with unilateral UE support on bed while reaching in all directions with other UE, CGA-mod assist for balance. Sitting balance EOB x 25 min this session. Intermittent supported sitting between bouts of balance tasks. Pt transferred back to supine, needs in reach and bed alarm set, PRAFOs donned.   Therapy Documentation Precautions:  Precautions Precautions: Fall, Cervical Precaution Booklet Issued: No Precaution Comments: Reveiwed precautions with patient Required Braces or Orthoses: Cervical Brace Cervical Brace: Hard collar, At all times Restrictions Weight Bearing Restrictions: No     Therapy/Group: Individual Therapy  Cresenciano Genre, PT, DPT,  CSRS 05/18/2019, 8:40 AM

## 2019-05-18 NOTE — Progress Notes (Signed)
Physical Therapy Session Note  Patient Details  Name: [name] Ford MRN: [id number] Date of Birth: January 29, 1986  Today's Date: 05/18/2019 PT Individual Time: 1130-1200 PT Individual Time Calculation (min): 30 min   Short Term Goals: Week 1:  PT Short Term Goal 1 (Week 1): Pt will initiate OOB transfers PT Short Term Goal 2 (Week 1): Pt will tolerate sitting up in w/c x 1 hour PT Short Term Goal 3 (Week 1): Pt will initiate w/c mobility Week 2:    Week 3:     Skilled Therapeutic Interventions/Progress Updates:    PAIN c/o pain in bilat UE's and neck , treatment to tolerance, distraction   Pt initially supine and initially hesistant, but w/fiance's encouragement agreeable to treatment session with focus on sitting tolerance/balance.  Pt wearing HCC. Pt rolls to L/R using rails w/verbal cues and mod assist for repositioning of abdomial binder.   BP supine 136/88 Supine to L side to sit performed gradually w/max assist and cues for sequencing.  BP after sitting 5+ min 138/90 In sitting worked on sitting balance w/progression to propping w/bilat UEs to hands on knees to alternating arm raise w/single hand on knee w/cga to mod assist w/occasional post LOB.   In sitting, discussed importance of sitting balance for transfers, dressing, selfcare and functional independence.  Also discussed functional expecatations w/his level of injury w/full cooperation w/therapy team.  Introduced concepts of pressure relief and wc mobility.  Pt tolerated 20 min activity sitting on edge of bed w/no issues of BP or AD.  Pt returned supine w/max assist of 1 and cues for sequencing.  Scoots in bed w/cues for use of rails. Pt left supine w/rails up x 3, alarm set, bed in lowest position, and needs in reach.    Therapy Documentation Precautions:  Precautions Precautions: Fall, Cervical Precaution Booklet Issued: No Precaution Comments: Reveiwed precautions with patient Required Braces or Orthoses: Cervical  Brace Cervical Brace: Hard collar, At all times Restrictions Weight Bearing Restrictions: No    Therapy/Group: Individual Therapy  Rada Hay, PT   Shearon Balo 05/18/2019, 12:20 PM

## 2019-05-18 NOTE — Progress Notes (Signed)
Occupational Therapy Session Note  Patient Details  Name: [name] Ford MRN: [id number] Date of Birth: 1985/07/27  Today's Date: 05/18/2019 OT Individual Time: 0710-0720 OT Individual Time Calculation (min): 10 min  and Today's Date: 05/18/2019 OT Missed Time: 50 Minutes Missed Time Reason: Patient unwilling/refused to participate without medical reason;Patient fatigue   Short Term Goals: Week 1:  OT Short Term Goal 1 (Week 1): Pt will maintain sitting balance with max A of 1 caregiver for 5 min to demo improved balance OT Short Term Goal 2 (Week 1): Pt will complete 2/4 steps of UB dressing with S OT Short Term Goal 3 (Week 1): Pt will complete transfer to w/c wiht MAX A of 1 caregiver in prep for [facility] transfer  Skilled Therapeutic Interventions/Progress Updates:    Upon entering the room, pt supine in bed and awake. Pt reports generalized pain and that medication was just recently given prior to therapist arrival. OT discussing with pt goals and what he has been working on in previous therapy sessions. Pt verbalized feeling tired and declining OT intervention this morning. OT discussed schedule and he requested to not have scheduled therapy sessions at 7 but would prefer to start any time after 8. OT will make note as an attempt to increase participation. Call bell within reach upon exiting the room.   Therapy Documentation Precautions:  Precautions Precautions: Fall, Cervical Precaution Booklet Issued: No Precaution Comments: Reveiwed precautions with patient Required Braces or Orthoses: Cervical Brace Cervical Brace: Hard collar, At all times Restrictions Weight Bearing Restrictions: No General: General OT Amount of Missed Time: 50 Minutes Vital Signs: Therapy Vitals Temp: 98.4 F (36.9 C) Pulse Rate: 92 Resp: 17 BP: 133/86 Patient Position (if appropriate): Sitting Oxygen Therapy SpO2: 100 % Pain: Pain Assessment Pain Scale: 0-10 Pain Score: 8  Faces Pain Scale: Hurts  little more Pain Type: Acute pain Pain Location: Neck Pain Orientation: Posterior Pain Radiating Towards: back Pain Descriptors / Indicators: Aching Pain Frequency: Constant Pain Onset: On-going Patients Stated Pain Goal: 3 Pain Intervention(s): Medication (See eMAR);Repositioned Multiple Pain Sites: No   Therapy/Group: Individual Therapy  Alen Bleacher 05/18/2019, 7:23 AM

## 2019-05-18 NOTE — Progress Notes (Signed)
Physical Therapy Session Note  Patient Details  Name: [name] Ford MRN: [id number] Date of Birth: 07-06-1985  Today's Date: 05/18/2019 PT Individual Time: 1300-1408 PT Individual Time Calculation (min): 68 min   Short Term Goals: Week 1:  PT Short Term Goal 1 (Week 1): Pt will initiate OOB transfers PT Short Term Goal 2 (Week 1): Pt will tolerate sitting up in w/c x 1 hour PT Short Term Goal 3 (Week 1): Pt will initiate w/c mobility  Skilled Therapeutic Interventions/Progress Updates:   Pt received supine in bed and agreeable to PT at bed level. Per chart review, pt has had multiple bouts of AD since admission, BP monitored closely throughout treatment. Scooting to [facility] with +2 assist for improved LE and thoracic position. Supine>sitting in chair position in bed with orthostatic assessment 0 deg, 130/75 HR 76. 45 deg: 127/82, HR 82. 60 deg: 131/79. Pt instructed pt sitting balance without back support x 4 min and to performed forward reach x 5 BUE  with HHA on reaching UE and mod assist at trunk. Returned to supported position prolonged rest in reclied position. Additional sitting balance x 2 min in neutral, pt then performed lateral weight shift R and L x 10 Bil. Min assist from PT to ensure safety and prevent excessive anterior weight shift. Pt pt reports mild dizziness. And returned to reclined position. BP assessed. 110/68, HR 134. 1 min rest and reassessed vitals: 1346/102, HR 84. Decreased orthostatic s/s. Pt performed additional sitting balance with forward reach with contralateral weight shift prior to each reach x 6 Bil. Improved stability and confidence compared to previous reaching task. Pt reports increasing pain at incision site. Pt returned to supine. Pt performed manual therapy for improve hip/HS ROM to allow increased independence in long sitting as appropriate. 3 x 2 min HS stretch to BLE, as well as hip/knee flexion/extension x 10 BLE. Pt left supine in bed with call bell in reach and  all needs met.       Therapy Documentation Precautions:  Precautions Precautions: Fall, Cervical Precaution Booklet Issued: No Precaution Comments: Reveiwed precautions with patient Required Braces or Orthoses: Cervical Brace Cervical Brace: Hard collar, At all times Restrictions Weight Bearing Restrictions: No    Vital Signs:  see above Pain: 6/10 following therapy, upp back. Pt repositioned.    Therapy/Group: Individual Therapy  Lorie Phenix 05/18/2019, 2:07 PM

## 2019-05-18 NOTE — Progress Notes (Addendum)
Bowel program started at 1845. Patient positioned on left side and dig stim done for approximately 15 sec, rectal vault clear. Suppository inserted at 18 55 and  dig stim performed again for about 15sec.at 19:15 .Night shift nurse will finish the bowel program.

## 2019-05-18 NOTE — Progress Notes (Signed)
Social Work Patient ID: [name] Ford, male   DOB: [birth year], 34 y.o.   MRN: [id number]    1st attempt to meet with pt to discuss d/c date, pt not in the room and off the floor due to testing. SW wrote d/c date on board in room.   2nd attempt- pt in therapy. SW informed pt and pt fiance SW will come back later to discuss discharge date/goals from team conferncer. SW informed date written on board in room. SW to follow-up.   Cecile Sheerer, MSW, LCSWA Office: [phone] Cell: [phone] Fax: [phone]

## 2019-05-18 NOTE — Progress Notes (Signed)
Occupational Therapy Session Note  Patient Details  Name: [name] Ford MRN: [id number] Date of Birth: 11-Aug-1985  Today's Date: 05/18/2019 OT Individual Time: 3300-7622 OT Individual Time Calculation (min): 29 min    Short Term Goals: Week 1:  OT Short Term Goal 1 (Week 1): Pt will maintain sitting balance with max A of 1 caregiver for 5 min to demo improved balance OT Short Term Goal 2 (Week 1): Pt will complete 2/4 steps of UB dressing with S OT Short Term Goal 3 (Week 1): Pt will complete transfer to w/c wiht MAX A of 1 caregiver in prep for [facility] transfer  Skilled Therapeutic Interventions/Progress Updates:    Upon entering the room, pt supine in bed and continues to report feeling unwell. He has not had breakfast this morning either. Pt agreeable to grooming tasks, washing UB, and changing gown from bed level with set up A. He declined OOB and EOB activities this session. BP in supine is 120/69. Pt remaining in bed with call bell and all needed items within reach upon exiting the room.   Therapy Documentation Precautions:  Precautions Precautions: Fall, Cervical Precaution Booklet Issued: No Precaution Comments: Reveiwed precautions with patient Required Braces or Orthoses: Cervical Brace Cervical Brace: Hard collar, At all times Restrictions Weight Bearing Restrictions: No General: General OT Amount of Missed Time: 50 Minutes Pain: Pain Assessment Pain Scale: 0-10 Faces Pain Scale: Hurts a little bit Pain Intervention(s): Repositioned   Therapy/Group: Individual Therapy  Alen Bleacher 05/18/2019, 10:30 AM

## 2019-05-18 NOTE — Progress Notes (Signed)
Day shift nurse started bowel program and night shift nurse continue bowel program at 8:00 PM when patient called for incontinent care. Patient was turned to the left side, nurse performed dig stim for 15 seconds. No evidence of stool and rectal vault is clear. Patient requested not to take oxycodone d/t patient reported " I feel high". At 0032 nurse performed I/O cath with urine output of 400 cc. Patient also had large amount of yellow mucus discharge from the rectum. Lajoyce Corners Cedric Denison, LPN

## 2019-05-18 NOTE — Plan of Care (Signed)
  Problem: Consults Goal: RH SPINAL CORD INJURY PATIENT EDUCATION Description:  See Patient Education module for education specifics.  Outcome: Progressing Goal: Skin Care Protocol Initiated - if Braden Score 18 or less Description: If consults are not indicated, leave blank or document N/A Outcome: Progressing   Problem: SCI BOWEL ELIMINATION Goal: RH STG MANAGE BOWEL WITH ASSISTANCE Description: STG Manage Bowel with max Assistance. Outcome: Progressing Flowsheets (Taken 05/18/2019 1450) STG: Pt will manage bowels with assistance: 1-Total assistance Goal: RH STG SCI MANAGE BOWEL WITH MEDICATION WITH ASSISTANCE Description: STG SCI Manage bowel with medication with max assistance. Outcome: Progressing Goal: RH STG SCI MANAGE BOWEL PROGRAM W/ASSIST OR AS APPROPRIATE Description: STG SCI Manage bowel program w/ max assist or as appropriate. Outcome: Progressing Flowsheets (Taken 05/18/2019 1450) STG: SCI Pt will manage bowels with assistance or as appropriate: Total assist   Problem: SCI BLADDER ELIMINATION Goal: RH STG MANAGE BLADDER WITH ASSISTANCE Description: STG Manage Bladder With max Assistance Outcome: Progressing Flowsheets (Taken 05/18/2019 1450) STG: Pt will manage bladder with assistance: 1-Total assistance   Problem: RH SKIN INTEGRITY Goal: RH STG SKIN FREE OF INFECTION/BREAKDOWN Description: Skin to remain free from breakdown with max assist while on rehab. Outcome: Progressing Goal: RH STG MAINTAIN SKIN INTEGRITY WITH ASSISTANCE Description: STG Maintain Skin Integrity With max Assistance. Outcome: Progressing Flowsheets (Taken 05/18/2019 1450) STG: Maintain skin integrity with assistance: 2-Maximum assistance Goal: RH STG ABLE TO PERFORM INCISION/WOUND CARE W/ASSISTANCE Description: STG Able To Perform Incision/Wound Care With max Assistance. Outcome: Progressing Flowsheets (Taken 05/18/2019 1450) STG: Pt will be able to perform incision/wound care with  assistance: 2-Maximum assistance   Problem: RH SAFETY Goal: RH STG ADHERE TO SAFETY PRECAUTIONS W/ASSISTANCE/DEVICE Description: STG Adhere to Safety Precautions With max Assistance appropriate assistive Device. Outcome: Progressing Flowsheets (Taken 05/18/2019 1450) STG:Pt will adhere to safety precautions with assistance/device: 2-Maximum assistance   Problem: RH PAIN MANAGEMENT Goal: RH STG PAIN MANAGED AT OR BELOW PT'S PAIN GOAL Description: <4 on a 0-10 pain scale. Outcome: Progressing   Problem: RH KNOWLEDGE DEFICIT SCI Goal: RH STG INCREASE KNOWLEDGE OF SELF CARE AFTER SCI Description: Patient and caregiver will demonstrate knowledge of medication management, bowel and bladder management, skin care management, and follow up care with the MD post discharge with min assist from staff. Outcome: Progressing

## 2019-05-18 NOTE — Progress Notes (Signed)
Patient had large loose BM with mucus this morning. Patient I/O cath output was 300 at 0630. PRN percocet  Was given at 0630 for neck pain 8/10 that radiates to lower back. F/u result still pending and will continue to monitor patient. Lajoyce Corners Bluma Buresh, LPN

## 2019-05-19 ENCOUNTER — Inpatient Hospital Stay (HOSPITAL_COMMUNITY): Payer: Self-pay | Admitting: Physical Therapy

## 2019-05-19 ENCOUNTER — Ambulatory Visit (HOSPITAL_COMMUNITY): Payer: Medicaid Other | Attending: Physical Medicine and Rehabilitation

## 2019-05-19 ENCOUNTER — Inpatient Hospital Stay (HOSPITAL_COMMUNITY): Payer: Self-pay | Admitting: Occupational Therapy

## 2019-05-19 DIAGNOSIS — G8221 Paraplegia, complete: Secondary | ICD-10-CM | POA: Diagnosis not present

## 2019-05-19 DIAGNOSIS — Z952 Presence of prosthetic heart valve: Secondary | ICD-10-CM

## 2019-05-19 LAB — CBC WITH DIFFERENTIAL/PLATELET
Abs Immature Granulocytes: 0.09 10*3/uL — ABNORMAL HIGH (ref 0.00–0.07)
Basophils Absolute: 0 10*3/uL (ref 0.0–0.1)
Basophils Relative: 0 %
Eosinophils Absolute: 0.2 10*3/uL (ref 0.0–0.5)
Eosinophils Relative: 2 %
HCT: 30.3 % — ABNORMAL LOW (ref 39.0–52.0)
Hemoglobin: 9.8 g/dL — ABNORMAL LOW (ref 13.0–17.0)
Immature Granulocytes: 1 %
Lymphocytes Relative: 24 %
Lymphs Abs: 2 10*3/uL (ref 0.7–4.0)
MCH: 30.1 pg (ref 26.0–34.0)
MCHC: 32.3 g/dL (ref 30.0–36.0)
MCV: 92.9 fL (ref 80.0–100.0)
Monocytes Absolute: 1 10*3/uL (ref 0.1–1.0)
Monocytes Relative: 11 %
Neutro Abs: 5.2 10*3/uL (ref 1.7–7.7)
Neutrophils Relative %: 62 %
Platelets: 453 10*3/uL — ABNORMAL HIGH (ref 150–400)
RBC: 3.26 MIL/uL — ABNORMAL LOW (ref 4.22–5.81)
RDW: 12.9 % (ref 11.5–15.5)
WBC: 8.4 10*3/uL (ref 4.0–10.5)
nRBC: 0 % (ref 0.0–0.2)

## 2019-05-19 LAB — BASIC METABOLIC PANEL
Anion gap: 10 (ref 5–15)
BUN: 24 mg/dL — ABNORMAL HIGH (ref 6–20)
CO2: 24 mmol/L (ref 22–32)
Calcium: 9.2 mg/dL (ref 8.9–10.3)
Chloride: 103 mmol/L (ref 98–111)
Creatinine, Ser: 1.16 mg/dL (ref 0.61–1.24)
GFR calc Af Amer: >60 mL/min (ref >60)
GFR calc non Af Amer: >60 mL/min (ref >60)
Glucose, Bld: 99 mg/dL (ref 70–99)
Potassium: 4 mmol/L (ref 3.5–5.1)
Sodium: 137 mmol/L (ref 135–145)

## 2019-05-19 MED ORDER — SENNA 8.6 MG PO TABS
2.0000 | ORAL_TABLET | Freq: Two times a day (BID) | ORAL | Status: DC
  Administered 2019-05-19 – 2019-06-01 (×23): 17.2 mg via ORAL
  Filled 2019-05-19 (×24): qty 2

## 2019-05-19 MED ORDER — MIDODRINE HCL 5 MG PO TABS
5.0000 mg | ORAL_TABLET | Freq: Three times a day (TID) | ORAL | Status: DC
  Administered 2019-05-19 – 2019-06-01 (×39): 5 mg via ORAL
  Filled 2019-05-19 (×39): qty 1

## 2019-05-19 MED ORDER — POLYETHYLENE GLYCOL 3350 17 G PO PACK
17.0000 g | PACK | Freq: Every day | ORAL | Status: DC
  Administered 2019-05-19 – 2019-05-22 (×4): 17 g via ORAL
  Filled 2019-05-19 (×12): qty 1

## 2019-05-19 MED ORDER — FLUDROCORTISONE ACETATE 0.1 MG PO TABS
0.1000 mg | ORAL_TABLET | Freq: Every day | ORAL | Status: DC
  Administered 2019-05-19 – 2019-06-01 (×14): 0.1 mg via ORAL
  Filled 2019-05-19 (×14): qty 1

## 2019-05-19 NOTE — Progress Notes (Signed)
Bilateral lower extremity venous duplex has been completed. Preliminary results can be found in CV Proc through chart review.   05/19/19 1:13 PM Olen Cordial RVT

## 2019-05-19 NOTE — Progress Notes (Signed)
Occupational Therapy Session Note  Patient Details  Name: [name] Ford MRN: [id number] Date of Birth: 26-Aug-1985  Today's Date: 05/19/2019 OT Individual Time: 2353-6144 OT Individual Time Calculation (min): 13 min  60 minutes pt refusal  Short Term Goals: Week 1:  OT Short Term Goal 1 (Week 1): Pt will maintain sitting balance with max A of 1 caregiver for 5 min to demo improved balance OT Short Term Goal 2 (Week 1): Pt will complete 2/4 steps of UB dressing with S OT Short Term Goal 3 (Week 1): Pt will complete transfer to w/c wiht MAX A of 1 caregiver in prep for [facility] transfer  Skilled Therapeutic Interventions/Progress Updates:    Upon entering the room, pt supine in bed with no c/o pain. Pt answering the phone during the session and having conversation. When asked to continue working on progressing to sitting on EOB but refused. When asked for reason pt reports, " I just don't feel like it". OT asking pt what he would like to address this session and he wanted therapist to stretch LEs and at this time pt remains on phones and not engaged with session. OT advised pt at last session he needed to continue to work on EOB and transfer if possible into wheelchair. Pt asking therapist to leave room and declined further.   Therapy Documentation Precautions:  Precautions Precautions: Fall, Cervical Precaution Booklet Issued: No Precaution Comments: Reveiwed precautions with patient Required Braces or Orthoses: Cervical Brace Cervical Brace: Hard collar, At all times Restrictions Weight Bearing Restrictions: No General: General OT Amount of Missed Time: 60 Minutes Vital Signs: Therapy Vitals Temp: 98.7 F (37.1 C) Pulse Rate: 80 Resp: 18 BP: 128/76 Patient Position (if appropriate): Lying Oxygen Therapy SpO2: 100 % O2 Device: Room Air Pain: Pain Assessment Faces Pain Scale: Hurts a little bit   Therapy/Group: Individual Therapy  Alen Bleacher 05/19/2019, 4:41 PM

## 2019-05-19 NOTE — Progress Notes (Signed)
Per OT patient had an episode of autonomic dysreflexia during their session. Patient was placed back in bed and had no complains.

## 2019-05-19 NOTE — Progress Notes (Signed)
At 2000, Clinical research associate went to patient room and continue bowel program. Patient was already in left lateral position, writer performed dig stim. No evidence of stool, rectal vault is clear. Patient requested pain medicine and stated " I'm not going to take the Tramadol, because its not working." Patient reported pain level 9/10 and Percocet 2 tabs was given. At 0030, Clinical research associate performed I/O cath. Total urine output was 400 and patient had medium loose yellow stool with mucus. Patient requested pain medicine and patient pain level is at 9/10 after movement and repositioned in bed. Patient refused to take scheduled Tramadol and asked for Percocet. Writer gave 2 tabs of Percocet, f/u result patient was asleep. Will continue to monitor patient.

## 2019-05-19 NOTE — Progress Notes (Signed)
Patient did not want to eat lunch or dinner. Bowel program started at 1848. Patient positioned on left side, dig stim done, rectal vault was empty except scant amount of light brown mucus BM. Suppository inserted at 1858 and dig stim performed x 3 . More gas and a small amount of light brown mucus BM came out. Night shift nurse to finish the bowel program.
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
## 2019-05-19 NOTE — Progress Notes (Signed)
Patient this morning woken up d/t severe pain 10/10. Patient refused I/O cath as well stated, " I have not been drinking that much since the last cath." Writer performed bladder scan to patient ,bladder volume is 124 mL. Patient requested to have I/O cath done after breakfast. - Beckey Downing, LPN

## 2019-05-19 NOTE — Progress Notes (Signed)
Occupational Therapy Session Note  Patient Details  Name: [name] Ford MRN: [id number] Date of Birth: 1985-11-14  Today's Date: 05/19/2019 OT Individual Time: 1100-1155 OT Individual Time Calculation (min): 55 min    Short Term Goals: Week 1:  OT Short Term Goal 1 (Week 1): Pt will maintain sitting balance with max A of 1 caregiver for 5 min to demo improved balance OT Short Term Goal 2 (Week 1): Pt will complete 2/4 steps of UB dressing with S OT Short Term Goal 3 (Week 1): Pt will complete transfer to w/c wiht MAX A of 1 caregiver in prep for [facility] transfer  Skilled Therapeutic Interventions/Progress Updates:    Upon entering the room, pt supine in bed and agreeable to OT intervention with max encouragement. Pt reports, " I'm ready to go home. When can I leave." Education provided to pt regarding medication concerns and determine equipment needs. OT providing total A to don LB clothing and mod A to roll L <> R to pull over B hips. Abdominal binder donned as well.  Total A +2 supine >sit with pt sitting up for 1 minute ~ before having episode of pt's eyes rolling back, becoming unresponsive, and foaming at the mouth. Pt returning to supine and becoming alert very quickly once supine. Pt does not remember becoming unresponsive. BP taken (see results below for BPs throughout session). Pt agreeable to attempting to sit up again with total A of 2 again to EOB. Pt sitting up for 2-3 minutes but continues to report not feeling well and leaning back against therapist. Pt returned to bed with total A of 2 sit >supine. OT adjusting hard collar secondary to pain and dressing rubbing. OT removed dressing as there is no drainage and repositioned brace. Pt placed into chair position and tolerating during session. Pt given remote to adjust bed as needed but requesting pt to remain in chair position 1.5 hrs 2/x daily. Lap belt placed attached to bed for safety.  Call bell and all needed items within reach upon  exiting the room.   BPs Supine in bed without binder 118/71 Abdominal binder donned and pt sitting EOB with episode BP 135/89 after episode Sitting up second time on EOB 97/40 116/88 while in chair position in bed after 5 minutes   Therapy Documentation Precautions:  Precautions Precautions: Fall, Cervical Precaution Booklet Issued: No Precaution Comments: Reveiwed precautions with patient Required Braces or Orthoses: Cervical Brace Cervical Brace: Hard collar, At all times Restrictions Weight Bearing Restrictions: No General: General OT Amount of Missed Time: 60 Minutes Vital Signs: Therapy Vitals Temp: 98.7 F (37.1 C) Pulse Rate: 80 Resp: 18 BP: 128/76 Patient Position (if appropriate): Lying Oxygen Therapy SpO2: 100 % O2 Device: Room Air Pain: Pain Assessment Faces Pain Scale: Hurts a little bit   Therapy/Group: Individual Therapy  Alen Bleacher 05/19/2019, 5:02 PM

## 2019-05-19 NOTE — Progress Notes (Signed)
Physical Therapy Session Note  Patient Details  Name: [name] Ford MRN: [id number] Date of Birth: 05/20/1985  Today's Date: 05/19/2019   Short Term Goals: Week 1:  PT Short Term Goal 1 (Week 1): Pt will initiate OOB transfers PT Short Term Goal 2 (Week 1): Pt will tolerate sitting up in w/c x 1 hour PT Short Term Goal 3 (Week 1): Pt will initiate w/c mobility  Skilled Therapeutic Interventions/Progress Updates:   Pt received supine in bed and refusing PT treatment. PT strongly encouraging Pt to attempt transfer to [facility]. Pt states he "aint doing anything until 10:00." When asked why, Pt reports, "my girl pissed me off, and if she aint here by 10:00 then I'm not doing anything all day." PT attempted to educate pt that the presence of his "girl" is irrelevant to his mobility or functional recovery, And if he plans to return home, he will need to be able to perform transfer with or without her present. The patient continues to decline any therapy at this time. PT will re-attempt at later time/day.       Therapy Documentation Precautions:  Precautions Precautions: Fall, Cervical Precaution Booklet Issued: No Precaution Comments: Reveiwed precautions with patient Required Braces or Orthoses: Cervical Brace Cervical Brace: Hard collar, At all times Restrictions Weight Bearing Restrictions: No Pain:   denies at rest  Therapy/Group: Individual Therapy  Golden Pop 05/19/2019, 12:06 PM

## 2019-05-19 NOTE — Plan of Care (Signed)
  Problem: SCI BOWEL ELIMINATION Goal: RH STG MANAGE BOWEL WITH ASSISTANCE Description: STG Manage Bowel with max Assistance. Outcome: Not Progressing; bowel program   Problem: SCI BLADDER ELIMINATION Goal: RH STG MANAGE BLADDER WITH ASSISTANCE Description: STG Manage Bladder With max Assistance Outcome: Not Progressing; in and out cath   

## 2019-05-20 ENCOUNTER — Encounter (HOSPITAL_COMMUNITY): Payer: Self-pay | Admitting: Psychology

## 2019-05-20 ENCOUNTER — Inpatient Hospital Stay (HOSPITAL_COMMUNITY): Payer: Self-pay | Admitting: Occupational Therapy

## 2019-05-20 ENCOUNTER — Inpatient Hospital Stay (HOSPITAL_COMMUNITY): Payer: Self-pay

## 2019-05-20 MED ORDER — MAGNESIUM CITRATE PO SOLN
1.0000 | Freq: Once | ORAL | Status: AC
  Administered 2019-05-20: 1 via ORAL
  Filled 2019-05-20: qty 296
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
## 2019-05-20 NOTE — Progress Notes (Signed)
Recreational Therapy Session Note  Patient Details  Name: [name] Ford MRN: [id number] Date of Birth: 1985/06/07 Today's Date: 05/20/2019  Order received.  Eval deferred until next Wed 3/31 due to LRT availability. Shenika Quint 05/20/2019, 12:21 PM

## 2019-05-20 NOTE — Progress Notes (Signed)
Approximately 53 staples removed from posterior neck and back. Steri strips applied. Patient tolerated without complaint.

## 2019-05-20 NOTE — Progress Notes (Addendum)
Patient had pizza delivered and ate well. Small BM of yellow mucus and dig stim performed with no results.

## 2019-05-20 NOTE — Plan of Care (Signed)
  Problem: Consults Goal: RH SPINAL CORD INJURY PATIENT EDUCATION Description:  See Patient Education module for education specifics.  Outcome: Progressing   Problem: SCI BOWEL ELIMINATION Goal: RH STG MANAGE BOWEL WITH ASSISTANCE Description: STG Manage Bowel with max Assistance. Outcome: Progressing   Problem: SCI BLADDER ELIMINATION Goal: RH STG MANAGE BLADDER WITH ASSISTANCE Description: STG Manage Bladder With max Assistance Outcome: Progressing   Problem: RH SKIN INTEGRITY Goal: RH STG MAINTAIN SKIN INTEGRITY WITH ASSISTANCE Description: STG Maintain Skin Integrity With max Assistance. Outcome: Progressing   Problem: RH SAFETY Goal: RH STG ADHERE TO SAFETY PRECAUTIONS W/ASSISTANCE/DEVICE Description: STG Adhere to Safety Precautions With max Assistance appropriate assistive Device. Outcome: Progressing   
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
## 2019-05-20 NOTE — Progress Notes (Signed)
Physical Therapy Weekly Progress Note  Patient Details  Name: [name] Ford MRN: [id number] Date of Birth: [birth year]  Beginning of progress report period: May 12, 2019 End of progress report period: May 20, 2019  Today's Date: 05/20/2019  PT Individual Time: 1400-1515  And [phone] PT Individual Time Calculation (min): 75 min and 60 min  Patient has met 3 of 3 short term goals.   Pt has struggled w/BP issues and motivation to participate, but we have seen definite improvement in these in the last few sessions.  He is now able to tolerate OOB to wc multiple times during day, has demonstrated improved BP response, has begun training w/SBTs, pressure relief strategies, balance and mobility training and is receptive to education/training w/less resistance overall.  He will continue to require intensive inpatient rehab w/LTG of becoming mod I w/basic transfers and wc skills.    Patient continues to demonstrate the following deficits muscle weakness, muscle joint tightness and muscle paralysis, decreased cardiorespiratoy endurance, abnormal tone and paraplegia and decreased sitting balance, decreased postural control and decreased balance strategies and therefore will continue to benefit from skilled PT intervention to increase functional independence with mobility.  Patient progressing toward long term goals..  Plan of care revisions: for pt to be mod I w/basic wc skills and transfers by dc.  PT Short Term Goals Week 1:  PT Short Term Goal 1 (Week 1): Pt will initiate OOB transfers PT Short Term Goal 1 - Progress (Week 1): Met PT Short Term Goal 2 (Week 1): Pt will tolerate sitting up in w/c x 1 hour PT Short Term Goal 2 - Progress (Week 1): Met PT Short Term Goal 3 (Week 1): Pt will initiate w/c mobility PT Short Term Goal 3 - Progress (Week 1): Met Week 2:  PT Short Term Goal 1 (Week 2): Pt will roll L/R w/use of leg loops (fiancee agreed to purchase dog collars) w/max assist using  momentum PT Short Term Goal 2 (Week 2): Pt will demonstrate static sitting on mat greater than 1 min w/supervision PT Short Term Goal 3 (Week 2): Pt will perform level transfers w/sliding board w/mod assist PT Short Term Goal 4 (Week 2): Pt will perform or request assist w/pressure relief on schedule 70mn every 361m  Skilled Therapeutic Interventions/Progress Updates:  Balance/vestibular training;Cognitive remediation/compensation;Community reintegration;Discharge planning;Disease management/prevention;DME/adaptive equipment instruction;Functional electrical stimulation;Functional mobility training;Neuromuscular re-education;Pain management;Patient/family education;Psychosocial support;Splinting/orthotics;Therapeutic Activities;Therapeutic Exercise;UE/LE Strength taining/ROM;UE/LE Coordination activities;Wheelchair propulsion/positioning   Therapy Documentation Precautions:  Precautions Precautions: Fall, Cervical Precaution Booklet Issued: No Precaution Comments: Reveiwed precautions with patient Required Braces or Orthoses: Cervical Brace Cervical Brace: Hard collar, At all times Restrictions Weight Bearing Restrictions: No    Other Treatments:    Session one: Pain:  Pt c/o cervical and L upper trap pain 8/10, treatment to tolerance, repositioning of collar, education, rest as needed.  Pt initially supine and agreeable to treatment session with focus on tolerance for OOB/upright sitting.  BP in supine 134/80  Performed stretching of bilat hamstrings and hip rotators (circle sit position L then R) some increased tone noted.  Bilat gastrocs also stretched w/moderate tightness but full DF achieved w/prolonged stretch.   Pt educated re use of leg loops/dog collars for self assist w/LE management.  [name] to purchase for pt for physical training. Pt total assist for donning Ted Hose and pants, and shoes in supine. Pt rolls mult times in each direction w/max assist w/cues for  sequencing for placement of abd binder, complettion of lower body dressing  as well as placement of sheet for transfer.   Pt then transferred  in supine from bed to tilt in space wc tilted fully using sheet and +4 assist.  Method chosen due to pt not tolerating other methods of transferrs/issues w/ repeated orthostatic hypotension. Pt then gradually brought to supine in gradual increments w/BP checks performed::  BP remained at 128-135/76-80 at each including in full upright.  Pt then tolerated OOB in upright x 60 min before requesting to return to bed.  Once sitting pt taken out of room and in sitting performed education re comorbidities associated w/prolonged bedrest, risk of these, importance of increasing tolerance, mobility goals. Pt left oob in wc call bell and needs in reach, slight tilt for safety.     Session 2:  Pain:  Pt c/o cervical and L upper trap pain 8/10, treatment to tolerance, repositioning of collar, education, rest as needed. Pt initially requesting to remain in bed and use bed features to come to sitting.  Pt encouraged/educated and agreed to transfer training.   Pt abd binder fastened.  Again instructed w/use of dog collars and continued to discuss this during all mobility sections of session.  Rolled to L w/max assist.  Gradually transitioned from sl to sitting on edge of bed, therapist slowly lowering legs to prevent hypotension.  BP remained stable, no symptoms. SBT to wc to R side w/max assist, max verbal cues for sequencing/mechanics, verbalized use of dogcollars for LE positioning w/transfer.  Once OOB, pt c/o "feeling a little funny", LEs elevated manually x 5 min, BP 134/76, LEs placed on legrests and pt denied further sys. Initiated wc propulsion in hallway using [name] x 41f w/cues for efficiency, c/o neck discomfort which limited distance and cadence w/task.   Pt transported to gym.  Performed hip stretching in sitting for increasing ER.  Instructed  pt w/pressure relief strategies including lateral and forward wt shift at table.  Pt able to perform each but time limited to approx 30sec due to neck discomfort.  Also instructed w/boosting technique but not performed due to pain/weakness/balance.  Instructed w/necessary schedule which all was then relayed to fiancee at end of session.  wc propulsion x 238fw/bilat UE's w/cues, slowly w/inefficient/short strokes.  Pt transported to room.  wc to bed SBT max +2 and reviewed head/hips and demonstrated eventual goal of squat pivot transfer to eliminate shear/ for skin protection.    Sit to side to supine w/max assist of 2 and cues for sequencing.  Shoes and binder removed, pt repositioned for comfort. Pt left supine w/rails up x 4, bed in lowest position, and needs in reach.  Therapy/Group: Individual Therapy BaCallie FieldingPTQuitman/26/2021, 4:30 PM

## 2019-05-20 NOTE — Progress Notes (Signed)
Order to remove posterior neck staples clarified with Pam PA. Per Pam , remove all staples.

## 2019-05-20 NOTE — Plan of Care (Signed)
  Problem: RH Bed to Chair Transfers Goal: LTG Patient will perform bed/chair transfers w/assist (PT) Description: LTG: Patient will perform bed to chair transfers with assistance (PT). Flowsheets (Taken 05/20/2019 1621) LTG: Pt will perform Bed to Chair Transfers with assistance level: Contact Guard/Touching assist Note: Goal changed due to increased expectations

## 2019-05-20 NOTE — Progress Notes (Signed)
Occupational Therapy Weekly Progress Note  Patient Details  Name: [name] Ford MRN: [id number] Date of Birth: [birth year]  Beginning of progress report period: May 12, 2019 End of progress report period: May 20, 2019  Today's Date: 05/20/2019 OT Individual Time: 1100-1200 OT Individual Time Calculation (min): 60 min    Patient has met 0 of 3 short term goals.  Pt's progress this week has been slow but came along better today. Pt continues to demonstrate significant orthostatic BP with sitting up with LEs dangling. MD has made some changes and pt is now wearing Abdominal binder and TEDS in activity in the day. Pt is total A +2 for transfers and to get to EOB and max to total A for bathing and dressing at bed level.  Pt can assist with UB in lateral slide board transfers and was able to tolerate sitting in a chair this week for ~55mn to 1 hr.   Patient continues to demonstrate the following deficits: muscle weakness and muscle paralysis, decreased cardiorespiratoy endurance, hypotension with chaning positions and decreased sitting balance, decreased postural control and decreased balance strategies and therefore will continue to benefit from skilled OT intervention to enhance overall performance with BADL and Reduce care partner burden.  Patient progressing toward long term goals..  Continue plan of care.  OT Short Term Goals Week 1:  OT Short Term Goal 1 (Week 1): Pt will maintain sitting balance with max A of 1 caregiver for 5 min to demo improved balance OT Short Term Goal 1 - Progress (Week 1): Progressing toward goal OT Short Term Goal 2 (Week 1): Pt will complete 2/4 steps of UB dressing with S OT Short Term Goal 2 - Progress (Week 1): Progressing toward goal OT Short Term Goal 3 (Week 1): Pt will complete transfer to w/c wiht MAX A of 1 caregiver in prep for BSC transfer OT Short Term Goal 3 - Progress (Week 1): Progressing toward goal Week 2:  OT Short Term Goal 1 (Week 2): Pt will  perform UB dressing (shirt) with min A OT Short Term Goal 2 (Week 2): Pt will perform level transfer mat<>chair with max A +1 OT Short Term Goal 3 (Week 2): Pt will tolerate being out of bed for ~2-3 hrs a day to improved ability to participate in functinal tasks out of the bed.  Skilled Therapeutic Interventions/Progress Updates:    1:1 Today was a great and productive session. Pt was back in bed after previous session of trying to tolerating being out of bed (only lasted ~30 min in the tilt in space w/c). Pt agreeable to getting back up. Abdominal binder and TEDS donned. Pt laterally transferred into tilted tilt in space with the pt remaining in supine position in the bed with HOB slightly elevated. Slide board placed under his bottom and back to help the transfer across the gap between w/c and bed; also used a sheet under the pt to help with the lateral transfer. PT tolerated the tilted w/c with knees bent and slowly progressed to sitting in a more upright position. Pt 's BP at the beginning of session was 138/78 and the once in w/c with sitting slightly up with shoes donned and knees adducted with gait belt BP was 139/75.  Pt taken to the gym and was able to tolerate a transfer to the mat via slide board with mod A +2 with max cues for sequencing and A to manage LEs. Pt BP sitting on EOB dropped to 125/79 but was  asymptomatic. At [facility] focus on sitting balance with therapy tech sitting behind pt on therapy ball. Pt taught about using head to help guide motion of body. Pt able to maintain sitting balance with min guard with bilateral UE support and the work to find sitting balance with only one UE support and then none - addressing static sitting and ability to come off of back support with and without UEs. Also focus on management of LEs to try to cross them to doff and donn shoes with support for LE and for balance (with therapy tech on ball behind him). Tolerated stretch.   BP taken again in sitting  131/101. Discussed trying of sitting in regular manual w/c with seat dumped to see tolerance and ability to propel. Pt transferred with max A +2 for safety into manual chair. Pt able to propel chair for ~15 feet but then reported it was rubbing his lateral back. Pt enjoyed the chair and wanted to sit up for ~ 1 hr in chair.  This clinician returned an hr later to assist pt back into bed via slide board.   Therapy Documentation Precautions:  Precautions Precautions: Fall, Cervical Precaution Booklet Issued: No Precaution Comments: Reveiwed precautions with patient Required Braces or Orthoses: Cervical Brace Cervical Brace: Hard collar, At all times Restrictions Weight Bearing Restrictions: No Pain: Pain Assessment Pain Score: 7  ongoing pain in neck but willing to continue to work in therapy   Therapy/Group: Individual Therapy  Willeen Cass [id number] [facility] 05/20/2019, 2:51 PM

## 2019-05-21 DIAGNOSIS — G8221 Paraplegia, complete: Secondary | ICD-10-CM

## 2019-05-21 DIAGNOSIS — T1490XS Injury, unspecified, sequela: Secondary | ICD-10-CM

## 2019-05-21 DIAGNOSIS — N319 Neuromuscular dysfunction of bladder, unspecified: Secondary | ICD-10-CM

## 2019-05-21 DIAGNOSIS — D62 Acute posthemorrhagic anemia: Secondary | ICD-10-CM

## 2019-05-21 DIAGNOSIS — K592 Neurogenic bowel, not elsewhere classified: Secondary | ICD-10-CM

## 2019-05-21 NOTE — Progress Notes (Signed)
Pt had large bowel movement.

## 2019-05-21 NOTE — Plan of Care (Signed)
  Problem: SCI BOWEL ELIMINATION Goal: RH STG MANAGE BOWEL WITH ASSISTANCE Description: STG Manage Bowel with max Assistance. Outcome: Not Progressing; bowel program   Problem: SCI BLADDER ELIMINATION Goal: RH STG MANAGE BLADDER WITH ASSISTANCE Description: STG Manage Bladder With max Assistance Outcome: Not Progressing; in and out cath

## 2019-05-21 NOTE — Progress Notes (Signed)
Patient refused his dulcolax suppository claims he had a large bowel movement this morning. Patient educated and he said he will do it later tonight.
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
## 2019-05-22 ENCOUNTER — Inpatient Hospital Stay (HOSPITAL_COMMUNITY): Payer: Self-pay | Admitting: *Deleted
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
## 2019-05-22 NOTE — Progress Notes (Signed)
Physical Therapy Session Note  Patient Details  Name: [name] Ford MRN: [id number] Date of Birth: [birth year]  Today's Date: 05/22/2019 PT Individual Time: 0900-0953 PT Individual Time Calculation (min): 53 min   Short Term Goals: Week 2:  PT Short Term Goal 1 (Week 2): Pt will roll L/R w/use of leg loops (fiancee agreed to purchase dog collars) w/max assist using momentum PT Short Term Goal 2 (Week 2): Pt will demonstrate static sitting on mat greater than 1 min w/supervision PT Short Term Goal 3 (Week 2): Pt will perform level transfers w/sliding board w/mod assist PT Short Term Goal 4 (Week 2): Pt will perform or request assist w/pressure relief on schedule 66mn every 380m  Skilled Therapeutic Interventions/Progress Updates: Pt presented in bed initially frustrated because he did not think he would get therapy on a Sunday. Pt also indicated that he thinks he would be ready for d/c by Easter. Discussed with pt current deficits (sitting balance, SB transfer requiring significant assistance). Pt agreeable to work on sitting balance at EOB due to limited assistance (only 30 min tech help). Pt expressed does not feel that BP regulation is not a problem and that ms pain/spasm most limiting factor, adv would work this session based on symptoms vs numbers. PTA donned TED hose total A and pt pulled self up to long sitting using bed rails to allow PTA to don abdominal binder. Pt talkative throughout activity and did not indicate any s/s hypotension. Performed supine to sit at EOB modA x 2 with PTA managing BLE for time management and second person guarding pt. Pt able to push through with BUE to come to upright position. Pt at EOB x 15 min participating in sitting balance activities requiring overall minA for sitting balance due to occasional mild LOB most prominent to L with pt able to self correct with use of UE. Pt then attempted to use theraband while in sitting with pt requiring modA to maintain balance.  BP assessed in sitting after approx 10 min (taken in L forearm) 128/84 (97) HR 82. Once activity completed pt returned to supine maxA x1 for BLE management with pt able to pull self up in bed with bed rails. Pt performed rolling L/R to remove binder and readjust chuck. PTA performed BLE stretching for hamstrings, heel cord, and hip er/ir. Encouraged pt to have GF bring in dog supplies that previous PT discussed to initiate self stretching with pt verbalizing understanding. Pt left in bed at end of session with current needs met.      Therapy Documentation Precautions:  Precautions Precautions: Fall, Cervical Precaution Booklet Issued: No Precaution Comments: Reveiwed precautions with patient Required Braces or Orthoses: Cervical Brace Cervical Brace: Hard collar, At all times Restrictions Weight Bearing Restrictions: No General:   Vital Signs: Therapy Vitals Temp: 97.6 F (36.4 C) Pulse Rate: 66 Resp: 18 BP: 120/61 Patient Position (if appropriate): Lying Oxygen Therapy SpO2: 100 % O2 Device: Room Air Pain: Pain Assessment Pain Scale: 0-10 Pain Score: 2  Pain Type: Surgical pain Pain Descriptors / Indicators: Aching Pain Onset: On-going Pain Intervention(s): Medication (See eMAR) Mobility:   Locomotion :    Trunk/Postural Assessment :    Balance:   Exercises:   Other Treatments:      Therapy/Group: Individual Therapy  [name] Ford 05/22/2019, 4:20 PM

## 2019-05-22 NOTE — Plan of Care (Signed)
  Problem: SCI BOWEL ELIMINATION Goal: RH STG MANAGE BOWEL WITH ASSISTANCE Description: STG Manage Bowel with max Assistance. Outcome: Not Progressing; bowel program   Problem: SCI BLADDER ELIMINATION Goal: RH STG MANAGE BLADDER WITH ASSISTANCE Description: STG Manage Bladder With max Assistance Outcome: Not Progressing; in and out cath   Problem: RH PAIN MANAGEMENT Goal: RH STG PAIN MANAGED AT OR BELOW PT'S PAIN GOAL Description: <4 on a 0-10 pain scale. Outcome: Not Progressing; prefers percocet instead of scheduled ultram

## 2019-05-22 NOTE — Progress Notes (Signed)
Patient requested to move his dulcolax suppository at 8pm instead of after dinner.

## 2019-05-22 NOTE — Progress Notes (Signed)
Pt received dulcolax suppository and dig stim. Pt tolerated well and had a medium sized bowel movment this morning.

## 2019-05-23 ENCOUNTER — Inpatient Hospital Stay (HOSPITAL_COMMUNITY): Payer: Self-pay

## 2019-05-23 ENCOUNTER — Inpatient Hospital Stay (HOSPITAL_COMMUNITY): Payer: Self-pay | Admitting: Physical Therapy

## 2019-05-23 LAB — CBC
HCT: 33 % — ABNORMAL LOW (ref 39.0–52.0)
Hemoglobin: 10.7 g/dL — ABNORMAL LOW (ref 13.0–17.0)
MCH: 30.1 pg (ref 26.0–34.0)
MCHC: 32.4 g/dL (ref 30.0–36.0)
MCV: 93 fL (ref 80.0–100.0)
Platelets: 408 10*3/uL — ABNORMAL HIGH (ref 150–400)
RBC: 3.55 MIL/uL — ABNORMAL LOW (ref 4.22–5.81)
RDW: 13 % (ref 11.5–15.5)
WBC: 5.7 10*3/uL (ref 4.0–10.5)
nRBC: 0 % (ref 0.0–0.2)

## 2019-05-23 LAB — BASIC METABOLIC PANEL
Anion gap: 9 (ref 5–15)
BUN: 19 mg/dL (ref 6–20)
CO2: 25 mmol/L (ref 22–32)
Calcium: 9.1 mg/dL (ref 8.9–10.3)
Chloride: 105 mmol/L (ref 98–111)
Creatinine, Ser: 1.08 mg/dL (ref 0.61–1.24)
GFR calc Af Amer: >60 mL/min (ref >60)
GFR calc non Af Amer: >60 mL/min (ref >60)
Glucose, Bld: 104 mg/dL — ABNORMAL HIGH (ref 70–99)
Potassium: 4.3 mmol/L (ref 3.5–5.1)
Sodium: 139 mmol/L (ref 135–145)

## 2019-05-23 NOTE — Progress Notes (Signed)
Completed suppository and dig stim. Pt had a  medium sized bowel movement. Tolerated well.

## 2019-05-23 NOTE — Progress Notes (Signed)
Occupational Therapy Session Note  Patient Details  Name: [name] Ford MRN: [id number] Date of Birth: 15-Jun-1985  Today's Date: 05/23/2019 OT Individual Time: 0900-1000 OT Individual Time Calculation (min): 60 min    Short Term Goals: Week 2:  OT Short Term Goal 1 (Week 2): Pt will perform UB dressing (shirt) with min A OT Short Term Goal 2 (Week 2): Pt will perform level transfer mat<>chair with max A +1 OT Short Term Goal 3 (Week 2): Pt will tolerate being out of bed for ~2-3 hrs a day to improved ability to participate in functinal tasks out of the bed.  Skilled Therapeutic Interventions/Progress Updates:    OT intervention with focus on bed mobility, sitting balance, slide board transfers, sitting tolerance, and activity tolerance to prepare for discharge home and increase independence with BADLs. Pt requires max A for rolling R<>L to facilitate pulling pants over hips.  Dependent for donning [facility] and abdominal binder.  Max A for supine>sit EOB.  Min A for static sitting balance.  Slide board transfer bed>w/c with min A (bed higher than w/c). Pt states standard w/c is uncomfortable and does not provide adequate back support.  Pt requrested to return to bed.  Max A+2 for uphill slide board transfer.  Tot A+2 for sit>supine in bed.  Bed placed in chair position to facilitate pt's tolerance for sitting upright.  Pt able to manage bed controls independently. BP during session: supine-126/78, sitting in w/c-110/53, sitting in w/c after 5 mins-127/88.  Pt reported mild lightheadedness initially after transfer.  Therapy Documentation Precautions:  Precautions Precautions: Fall, Cervical Precaution Booklet Issued: No Precaution Comments: Reveiwed precautions with patient Required Braces or Orthoses: Cervical Brace Cervical Brace: Hard collar, At all times Restrictions Weight Bearing Restrictions: No   Pain: Pain Assessment Pain Scale: 0-10 Pain Score: 7  Pain Type: Acute pain Pain  Location: Neck Pain Radiating Towards: back Pain Descriptors / Indicators: Aching;Discomfort Pain Frequency: Constant Pain Onset: On-going Patients Stated Pain Goal: 4 Pain Intervention(s): meds admin during session; Repositioned   Therapy/Group: Individual Therapy  [name] 05/23/2019, 12:21 PM

## 2019-05-23 NOTE — Progress Notes (Signed)
Physical Therapy Session Note  Patient Details  Name: [name] Ford MRN: [id number] Date of Birth: August 05, 1985  Today's Date: 05/23/2019 PT Individual Time: 1104-1200 and 9323-5573 PT Individual Time Calculation (min): 56 min and 69 min  Short Term Goals: Week 2:  PT Short Term Goal 1 (Week 2): Pt will roll L/R w/use of leg loops (fiancee agreed to purchase dog collars) w/max assist using momentum PT Short Term Goal 2 (Week 2): Pt will demonstrate static sitting on mat greater than 1 min w/supervision PT Short Term Goal 3 (Week 2): Pt will perform level transfers w/sliding board w/mod assist PT Short Term Goal 4 (Week 2): Pt will perform or request assist w/pressure relief on schedule 68min every 78min  Skilled Therapeutic Interventions/Progress Updates:   First session:  Pt presents supine in bed, having received pain meds.  Pt agreeable to participate w/ therapy.  Pt tolerated stretches to HC, HS and adductors w/ long long holds at end-range.  Pt w/o c/o.  Pt required assist for LEsto roll to left and min A for upper body.  Pt sat EOB x 5' for BP monitor.  Pt states some dizziness/lightheadedness but improving.  Educated on breathing techniques.  Pt able to sit w/ UE support and placement of feet on floor.  Pt transferred bed > w/c w/ assist of 2, using slide board w/ re-positioning of feet during sequential transfer of 4-5 moves.  Pt using UEs w/ verbal cues for movement.  Pt wheeled outside and performed seated static balance w/o trunk support x 4" x 3 trials w/ rest break between.  Pt performed short distance w/c mobility of 20' but c/o pain in axillary region from pressure on armrests.  Pt returned to room and remained in w/c for lunch.  BP monitor of supine of 120/83 Seated : 117/101 immediately after transferring Seated in w/c: 114/76  Second session: Pt presents supine in bed, refusing to transfer to w/c.  Pt performed multiple sup to sit transfers w/ assist of 2 after rolling to left w/  right LE hooklying.  Pt unable to push w/ left UE.  Pt assisted to sitting EOB, placing bilateral feet on floor.  Pt required min A to attain seated balance and then close supervision to maintain w/ occasional LOB.  Education of pt for speed of movements and support w/ opposite UE before moving UEs.  Pt upset when loses balance "jerking my neck", but explained need for pt to work on balance to maintain w/o external support.  Pt states some pain w/ movement, but not quantifying.  Pt sat EOB performing peg board using bilateral UEs, unable simultaneously.  Pt performs throwing bean bags to Tic-Tac-Toe board w/ min A for balance.  Pt reaching forward outside of BOS w/ continued education on speed and balance.  Pt rested in supine position between each trial d/t fatigue.  Pt assisted into comfortable position when completed semi-reclined w/ LEs on pillows, unloading heels.    BP taken during rx Supine 123/81 Sitting EOB 135/103 Pt w/o c/o dizziness or lightheadedness.     Therapy Documentation Precautions:  Precautions Precautions: Fall, Cervical Precaution Booklet Issued: No Precaution Comments: Reveiwed precautions with patient Required Braces or Orthoses: Cervical Brace Cervical Brace: Hard collar, At all times Restrictions Weight Bearing Restrictions: No General:   Vital Signs:   Pain: 5/10 w/ mobility, surgical site/neck Pain Assessment Pain Scale: 0-10 Pain Score: 7  Pain Type: Acute pain Pain Location: Neck Pain Radiating Towards: back Pain Descriptors / Indicators:  Aching;Discomfort Pain Frequency: Constant Pain Onset: On-going Patients Stated Pain Goal: 4 Pain Intervention(s): Medication (See eMAR);Repositioned Mobility:   Locomotion :    Trunk/Postural Assessment :    Balance:   Exercises:   Other Treatments:      Therapy/Group: Individual Therapy  Lucio Edward 05/23/2019, 12:41 PM

## 2019-05-23 NOTE — Progress Notes (Signed)
Pt due for in and out cath he stated he was in severe pain and discomfort at this time and would prefer in and out cath to be done at later time. Pt scanned for 245 ml and last in and out cath was at 1am for 400.
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
## 2019-05-24 ENCOUNTER — Inpatient Hospital Stay (HOSPITAL_COMMUNITY): Payer: Self-pay

## 2019-05-24 ENCOUNTER — Inpatient Hospital Stay (HOSPITAL_COMMUNITY): Payer: Self-pay | Admitting: Occupational Therapy

## 2019-05-24 ENCOUNTER — Encounter (HOSPITAL_COMMUNITY): Payer: Self-pay | Admitting: Psychology

## 2019-05-24 ENCOUNTER — Inpatient Hospital Stay (HOSPITAL_COMMUNITY): Payer: Self-pay | Admitting: *Deleted

## 2019-05-24 ENCOUNTER — Inpatient Hospital Stay (HOSPITAL_COMMUNITY): Payer: Self-pay | Admitting: Physical Therapy

## 2019-05-24 NOTE — Progress Notes (Signed)
Occupational Therapy Note  Patient Details  Name: [name] Ford MRN: [id number] Date of Birth: 23-Apr-1985  Today's Date: 05/24/2019 OT Missed Time: 60 Minutes Missed Time Reason: Pain;Patient fatigue  Pt asleep upon arrival but easily aroused.  Pt stated he needed to learn how to self-cath.  I explained that nursing would have to assist him.  Pt also stated he needed to learn how to transfer but refused donning [facility] stating "not now." Pt c/o increased neck pain. Pt also c/o he was "tired" and had been awakened several times. Pt missed 60 mins skilled OT services.    Lavone Neri [facility] 05/24/2019, 9:52 AM
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
## 2019-05-24 NOTE — Progress Notes (Signed)
Patient self cath himself with supervision using sterile technique and did very well, continue to educate on sterile technique protocol, and encourage po liquids.

## 2019-05-24 NOTE — Progress Notes (Signed)
Physical Therapy Session Note  Patient Details  Name: [name] Ford MRN: [id number] Date of Birth: November 30, 1985  Today's Date: 05/24/2019 PT Individual Time: 1430-1520 PT Individual Time Calculation (min): 50 min   Short Term Goals: Week 2:  PT Short Term Goal 1 (Week 2): Pt will roll L/R w/use of leg loops (fiancee agreed to purchase dog collars) w/max assist using momentum PT Short Term Goal 2 (Week 2): Pt will demonstrate static sitting on mat greater than 1 min w/supervision PT Short Term Goal 3 (Week 2): Pt will perform level transfers w/sliding board w/mod assist PT Short Term Goal 4 (Week 2): Pt will perform or request assist w/pressure relief on schedule 54mn every 359m  Skilled Therapeutic Interventions/Progress Updates: Pt presented in tilt back chair with GF present agreeable to therapy. Pt stating some soreness at neck but able to participate. Per pt had been in w/c for approx 1:30hrs but had not performed any pressure relief. Provided edu to pt and GF regarding importance of providing pressure relief at minimum hourly. Pt was able to use UE to pull up trunk and perform lateral leans with instruction and guarding. Pt able to take a little pressure off but unable to clear buttock. Pt and GF verbalized understanding of need to perform pressure relief. Pt then transported to hallway and participated in w/c mobilty/propulsion. Pt proplled w/c [id number] with supervision and intermittent rests. Pt also performed additional [id number] through cones and demonstrating overall good safety. Upon returning to room pt performed SB transfer to bed with PTA setting up SB and requiring modA x 2 "uphill" for transfer. Pt was able to slightly boost/scoot to L while on SB but required maxA to complete lateral lean to allow PTA to remove SB. Pt participated in sitting balance/EOB activities placing legs on lap. Then attempting to reach with single extremity support then attempting placing BUE off lap. Pt  required assist for recovery when attempting both hands however was able to reach arm out and correct balance when LOB occurred with single UE support. Pt noted increased back pain while sitting EOB with minimal relief when leaned back with support, nsg notified. Pt requesting to return to supine due to increased pain. Performed sit to supine dependent x 2. Pt was able to assist pulling up towards HOB.[place] was able to perform rolling maxA x 1 for removal of abdominal binder. Pt repositioned to comfort and left with ball bell within reach and needs met.      Therapy Documentation Precautions:  Precautions Precautions: Fall, Cervical Precaution Booklet Issued: No Precaution Comments: Reveiwed precautions with patient Required Braces or Orthoses: Cervical Brace Cervical Brace: Hard collar, At all times Restrictions Weight Bearing Restrictions: No General: PT Amount of Missed Time (min): 10 Minutes PT Missed Treatment Reason: Patient fatigue;Pain Vital Signs: Therapy Vitals Temp: 98 F (36.7 C) Temp Source: Oral Pulse Rate: 62 Resp: 18 BP: 137/79 Patient Position (if appropriate): Lying Oxygen Therapy SpO2: 100 % O2 Device: Room Air Pain: Pain Assessment Pain Scale: 0-10 Pain Score: 8  Pain Type: Acute pain Pain Location: Neck Pain Orientation: Mid Pain Descriptors / Indicators: Pressure Pain Frequency: Intermittent Pain Onset: On-going Patients Stated Pain Goal: 5 Pain Intervention(s): Medication (See eMAR)    Therapy/Group: Individual Therapy  Jordann Grime  Kizzi Overbey, PTA  05/24/2019, 3:51 PM
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
## 2019-05-24 NOTE — Progress Notes (Signed)
Physical Therapy Session Note  Patient Details  Name: [name] Ford MRN: [id number] Date of Birth: 1985/05/29  Today's Date: 05/24/2019 PT Individual Time: 1135-1200 PT Individual Time Calculation (min): 25 min   Short Term Goals: Week 2:  PT Short Term Goal 1 (Week 2): Pt will roll L/R w/use of leg loops (fiancee agreed to purchase dog collars) w/max assist using momentum PT Short Term Goal 2 (Week 2): Pt will demonstrate static sitting on mat greater than 1 min w/supervision PT Short Term Goal 3 (Week 2): Pt will perform level transfers w/sliding board w/mod assist PT Short Term Goal 4 (Week 2): Pt will perform or request assist w/pressure relief on schedule every  Skilled Therapeutic Interventions/Progress Updates:    Pt received seated in bed, agreeable to PT session. No complaints of pain during session. Pt requesting to brush his teeth, is setup A for oral hygiene at bed level. Pt also requesting to wash his face, provided him with wet washcloth and he is able to wash his face independently. Discussed his desire to d/c this weekend rather than original set d/c date of 4/15. Discussed that at his current level patient requires 2 people for bed mobility and transfers and that so far he has exhibited minimal tolerance (30 min-60 min at most) sitting up in a w/c out of bed. Patient would require a hospital bed upon d/c home and would not be safe to perform a car transfer, requiring medical transport. Patient states "yeah but when I get home I will be motivated and I can do that". Encouraged pt to remain in hospital and participate fully in rehab program as we would be able to perform simulated and real car transfers here in a safe environment vs trying at home as he progresses in order to be able to safely trial this. Also discussed custom w/c seating evaluation and determination of what type of chair would be best and safest for patient to use. Pt states that being independent in a w/c is  important to him as well as being fast and describes an Warehouse manager w/c. Education with patient about his current level of function and needing trunk support so that he may not be ready for an ultralight manual chair to begin with but as his trunk control improves he can move towards a less restrictive w/c. Pt appears somewhat receptive to education. Pt is dependent to don TED hose at bed level in preparation for next therapy session in which he is agreeable to trial a different w/c. Pt left seated in bed with needs in reach at end of session.  Therapy Documentation Precautions:  Precautions Precautions: Fall, Cervical Precaution Booklet Issued: No Precaution Comments: Reveiwed precautions with patient Required Braces or Orthoses: Cervical Brace Cervical Brace: Hard collar, At all times Restrictions Weight Bearing Restrictions: No    Therapy/Group: Individual Therapy   Peter Congo, PT, DPT  05/24/2019, 4:19 PM

## 2019-05-24 NOTE — Progress Notes (Signed)
Occupational Therapy Session Note  Patient Details  Name: [name] Ford MRN: [id number] Date of Birth: Jan 19, 1986  Today's Date: 05/24/2019 OT Individual Time: 7412-8786 OT Individual Time Calculation (min): 40 min    Short Term Goals: Week 1:  OT Short Term Goal 1 (Week 1): Pt will maintain sitting balance with max A of 1 caregiver for 5 min to demo improved balance OT Short Term Goal 1 - Progress (Week 1): Progressing toward goal OT Short Term Goal 2 (Week 1): Pt will complete 2/4 steps of UB dressing with S OT Short Term Goal 2 - Progress (Week 1): Progressing toward goal OT Short Term Goal 3 (Week 1): Pt will complete transfer to w/c wiht MAX A of 1 caregiver in prep for BSC transfer OT Short Term Goal 3 - Progress (Week 1): Progressing toward goal Week 2:  OT Short Term Goal 1 (Week 2): Pt will perform UB dressing (shirt) with min A OT Short Term Goal 2 (Week 2): Pt will perform level transfer mat<>chair with max A +1 OT Short Term Goal 3 (Week 2): Pt will tolerate being out of bed for ~2-3 hrs a day to improved ability to participate in functinal tasks out of the bed.  Skilled Therapeutic Interventions/Progress Updates:    1:1 Pt was in the bed when arrived. Discussed options for wc and various seating options and for stages of recovery. Pt wants to d/c this weekend and a w/c today would need to be more supportive than in the future (with his level of injury). Pt was responsive to this discussion. Also discussed pressure relief and its importance and what that would look like in different kinds of chairs. Pt already with TEDS donned. Donned pants with max A with pt using the rail for rolling in bed with mod A for management of LEs. Begin to have pt help with postioning of LEs with LB dressing and with transition to coming to EOB. PT able to help right LE off bed with help and once assist pt onto his left elbow pt able to come into sitting position with mod A +2 with extra time. Trial of  sitting in a reclining w/c with a high back. Pt transferred into reclining w/c with min A +2. Total A for management of LEs and donning LE rest. Pt able to propel self to sit to participate in UB bathing and dressing (abdominal binder already donned). Cues provided for using the sink to help with weight shifting forward and maintaining balance. Pt left sitting up in w/c until next session with lap belt for safety to keep hip back.   Also discussed with pt the use of the CRAIG bed. Pt reports still using the chair position of the bed.    Therapy Documentation Precautions:  Precautions Precautions: Fall, Cervical Precaution Booklet Issued: No Precaution Comments: Reveiwed precautions with patient Required Braces or Orthoses: Cervical Brace Cervical Brace: Hard collar, At all times Restrictions Weight Bearing Restrictions: No Pain: No c/o pain that interfered with tx session  Therapy/Group: Individual Therapy  Roney Mans [facility] 05/24/2019, 4:09 PM

## 2019-05-24 NOTE — Plan of Care (Signed)
  Problem: Consults Goal: RH SPINAL CORD INJURY PATIENT EDUCATION Description:  See Patient Education module for education specifics.  Outcome: Progressing   Problem: SCI BOWEL ELIMINATION Goal: RH STG MANAGE BOWEL WITH ASSISTANCE Description: STG Manage Bowel with max Assistance. Outcome: Progressing   Problem: SCI BLADDER ELIMINATION Goal: RH STG MANAGE BLADDER WITH ASSISTANCE Description: STG Manage Bladder With max Assistance Outcome: Progressing   Problem: RH SKIN INTEGRITY Goal: RH STG MAINTAIN SKIN INTEGRITY WITH ASSISTANCE Description: STG Maintain Skin Integrity With max Assistance. Outcome: Progressing   Problem: RH SAFETY Goal: RH STG ADHERE TO SAFETY PRECAUTIONS W/ASSISTANCE/DEVICE Description: STG Adhere to Safety Precautions With max Assistance appropriate assistive Device. Outcome: Progressing

## 2019-05-24 NOTE — Progress Notes (Signed)
Initiated and completed bowel program per orders, patient  tolerated well, digital stimulation x2 and dulcolax suppository, Large light brown soft stool, incontinent care x2 and personal care provided. This patient was repositioning, call bell placed within reach, made as comfortable as possible

## 2019-05-24 NOTE — Progress Notes (Signed)
Patient was able to self cath with set up/supervision. Patient stated " make sure you mark it down ". This nurse encouraged patient to increase water intake as urine odor was strong,  and patient had low urine output.

## 2019-05-24 NOTE — Progress Notes (Addendum)
Social Work Patient ID: [name] Ford, male   DOB: 1986/02/12, 34 y.o.   MRN: [id number]    SW met with pt in room to provide updates from team conference, and discuss his desire to leave by Sunday per team reports. Pt confirms he would like to d/c to home as he would like to see his children, and believes he is doing better. SW asked pt if he would be willing to consider staying longer if not his d/c date. Pt aware SW will come back and speak with him and his girlfriend [name] Ford when she arrives.   *SW later went back to meet with pt in room. Pt girlfriend [name] Ford present. SW provided updates from team conference, and discussed with them the benefits of pt staying longer while here in rehab. Pt remains undecided if he will stay here longer. Pt and girlfriend to discuss further. Pt aware SW will follow-up with him later on his decision.   SW spoke with Jenny Reichmann [place]/financial counselor who reports pt has a pending Medicaid application, and referral was sent to [facility] for disability application.   Loralee Pacas, MSW, [place] Office: [phone] Cell: [phone] Fax: [phone]

## 2019-05-25 ENCOUNTER — Inpatient Hospital Stay (HOSPITAL_COMMUNITY): Payer: Self-pay | Admitting: *Deleted

## 2019-05-25 ENCOUNTER — Inpatient Hospital Stay (HOSPITAL_COMMUNITY): Payer: Self-pay | Admitting: Occupational Therapy

## 2019-05-25 ENCOUNTER — Inpatient Hospital Stay (HOSPITAL_COMMUNITY): Payer: Self-pay | Admitting: Physical Therapy

## 2019-05-25 ENCOUNTER — Inpatient Hospital Stay (HOSPITAL_COMMUNITY): Payer: Self-pay

## 2019-05-25 NOTE — Progress Notes (Signed)
Occupational Therapy Session Note  Patient Details  Name: [name] Ford MRN: [id number] Date of Birth: 1985/07/13  Today's Date: 05/25/2019 OT Individual Time: 1030-1130 OT Individual Time Calculation (min): 60 min    Short Term Goals: Week 2:  OT Short Term Goal 1 (Week 2): Pt will perform UB dressing (shirt) with min A OT Short Term Goal 2 (Week 2): Pt will perform level transfer mat<>chair with max A +1 OT Short Term Goal 3 (Week 2): Pt will tolerate being out of bed for ~2-3 hrs a day to improved ability to participate in functinal tasks out of the bed.  Skilled Therapeutic Interventions/Progress Updates:    Upon entering the room, pt supine in bed and agreeable to OT intervention. OT assisted pt with donning B leg loops. Pt able to utilize loops with min A to bring LEs off EOB. Mod A for supine >sit. Slide board transfer to wheelchair with min A of 2 and pt repositioning LEs with loops when cued. Pt propelled wheelchair to ADL apartment with supervision and increased time. Pt verbalized kitchen set up and OT educated pt on how to change set up to increase I with kitchen mobility and to obtain needed items. Pt demonstrated ability to turn wheelchair around in tight kitchen space and open refrigerator. Pt asking how he will be able to shower in the future. OT showing pt TTB in tub shower combo and discussed for future use. Pt verbalized understanding . OT assisted pt back to room for time management. OT positioned pt next to bed for pressure relief to the R side but pt unable to tolerate longer than 1 minute. Pt declined staying up in wheelchair. Max A slide board transfer from wheelchair > bed ( uphill). Total A sit >supine and pt move LEs in bed with loops. Call bell and all needed items within reach.   Therapy Documentation Precautions:  Precautions Precautions: Fall, Cervical Precaution Booklet Issued: No Precaution Comments: Reveiwed precautions with patient Required Braces or Orthoses:  Cervical Brace Cervical Brace: Hard collar, At all times Restrictions Weight Bearing Restrictions: No Pain: Pain Assessment Pain Scale: 0-10 Pain Score: 6  Faces Pain Scale: Hurts a little bit Pain Type: Acute pain Pain Location: Neck Pain Orientation: Mid Pain Descriptors / Indicators: Aching Pain Frequency: Intermittent Patients Stated Pain Goal: 5 Pain Intervention(s): Medication (See eMAR)   Therapy/Group: Individual Therapy  Alen Bleacher 05/25/2019, 12:33 PM

## 2019-05-25 NOTE — Progress Notes (Signed)
Physical Therapy Session Note  Patient Details  Name: [name] Ford MRN: [id number] Date of Birth: [birth year]  Today's Date: 05/25/2019 PT Individual Time: 0800-0900; 0930-1000 PT Individual Time Calculation (min): 60 min and 30 min  Short Term Goals: Week 2:  PT Short Term Goal 1 (Week 2): Pt will roll L/R w/use of leg loops (fiancee agreed to purchase dog collars) w/max assist using momentum PT Short Term Goal 2 (Week 2): Pt will demonstrate static sitting on mat greater than 1 min w/supervision PT Short Term Goal 3 (Week 2): Pt will perform level transfers w/sliding board w/mod assist PT Short Term Goal 4 (Week 2): Pt will perform or request assist w/pressure relief on schedule every  Skilled Therapeutic Interventions/Progress Updates:    Session 1: Pt received seated in bed, agreeable to PT session. Pt reports intermittent discomfort in neck throughout session, not rated. Pt with intermittent use of kpad for pain relief as well as repositioning and c-collar adjustment provided. Pt is dependent for LB bathing and dressing at bed level. Pt is setup A to wash front periarea and his face. Pt is dependent to wash buttocks and don new brief. Assisted pt with donning TED hose and abdominal binder for BP control. Introduced leg loops this session to allow pt greater independence with BLE mobility with transfers and bed mobility. Semi-reclined to sitting EOB with assist x 2 for safety for trunk control and BLE management. Leg loops incorrectly positioned so pt unable to use effectively. Slide board transfer bed to w/c with CGA x 1 and min A x 1 going downhill. Pt demonstrates improved trunk control and use of UB for transfers. Once seated in w/c pt able to perform oral hygiene at sink. Discussed pressure relief schedule and that pt will require assist to perform in next 30 min, pt agreeable. Pt left seated in w/c in room with needs in reach, safety belt in place around chest.  Session 2: Pt  received seated in w/c in room, reports soreness in buttocks after sitting up in w/c x 30 min. Reviewed pressure relief schedule (every 30 min for 2 min) and will continue to reinforce during rehab stay. Demonstrated several methods of pressure relief, pt unable to adequately unweight buttocks via anterior leaning and demonstrates fair ability to unweight via lateral leans onto the bed. Recommending dependent boosting for pressure relief at this time. Pt has incontinence of bowel while seated in w/c. Slide board transfer back to bed with mod A x 2 going uphill. Sit to supine assist x 2 for trunk control and BLE management. Pt is min to mod A for rolling R/L for dependent brief change and pericare. Pt left seated in bed with needs in reach at end of session. Cotreatment session with RT.  Therapy Documentation Precautions:  Precautions Precautions: Fall, Cervical Precaution Booklet Issued: No Precaution Comments: Reveiwed precautions with patient Required Braces or Orthoses: Cervical Brace Cervical Brace: Hard collar, At all times Restrictions Weight Bearing Restrictions: No    Therapy/Group: Individual Therapy   Peter Congo, PT, DPT  05/25/2019, 9:15 AM
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
## 2019-05-25 NOTE — Progress Notes (Signed)
Patient able to cath self with supervision. Patient needed reminders regarding cleanliness/using sterile technique. Patient educated about urinary tract infections. Patient delayed dinner until approximately 1945. This nurse unable to start bowel program. Patient educated regarding importance of retraining bowel and how important it is to have a routine dinner time.

## 2019-05-25 NOTE — Progress Notes (Signed)
Large amount of soft stool results from bowel program, incontinent care provided
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
## 2019-05-25 NOTE — Progress Notes (Signed)
Orthopedic Tech Progress Note Patient Details:  [name] Ford Aug 05, 1985 [id number] Called in order to [employer] for EXTRA PADS to collar.  Patient ID: [name] Ford, male   DOB: [birth year], 34 y.o.   MRN: [id number]   [name] Ford 05/25/2019, 9:12 AM

## 2019-05-25 NOTE — Progress Notes (Signed)
Social Work Patient ID: [name] Ford, male   DOB: 1985/07/23, 34 y.o.   MRN: [id number]   Per PT, pt will have w/c eval on Monday.   SW confirmed with pt that he will stay through date of d/c.   SW met with pt girlfriend Brandi in room to inform on above. SW also discussed catheters left in room for pt to trial as we are only able to obtain 30 day sample from local vendor until he is able to get approved through Medicaid. She reports she submitted addtl documents to [facility] for his disability application. She also states his telephone interview with SSD[employer] is on Monday, April 5 at [place] informed scheduling on upcoming appointment.   Loralee Pacas, MSW, [place] Office: [phone] Cell: [phone] Fax: [phone]

## 2019-05-25 NOTE — Progress Notes (Signed)
Physical Therapy Session Note  Patient Details  Name: [name] Ford MRN: [id number] Date of Birth: 1985/08/08  Today's Date: 05/25/2019 PT Individual Time: 1400-1515 PT Individual Time Calculation (min): 75 min   Short Term Goals: Week 2:  PT Short Term Goal 1 (Week 2): Pt will roll L/R w/use of leg loops (fiancee agreed to purchase dog collars) w/max assist using momentum PT Short Term Goal 2 (Week 2): Pt will demonstrate static sitting on mat greater than 1 min w/supervision PT Short Term Goal 3 (Week 2): Pt will perform level transfers w/sliding board w/mod assist PT Short Term Goal 4 (Week 2): Pt will perform or request assist w/pressure relief on schedule 14mn every 339m  Skilled Therapeutic Interventions/Progress Updates: Pt presented in bed with GF present agreeable to therapy. Pt states having some neck pain but was recently premedicated. PTA and tech donned leg straps total A for time management. Pt was able to manage legs to EOB with CGA and used bed features to complete coming to sitting EOB with minA. PTA set up SB and performed SB transfer to tilt back chair minA. Pt transported to rehab gym and performed SB transfer to level surface minA. Session focused on sitting balance with pt participated in basketball toss both performed with single upper extremity and BUE with cues maintain midline, and to attempt to correct lateral leans with UE. Pt was able to tolerate sitting EOM x approx 40 min with intermittent rests against physioball and maintain primarily min to modA dynamic sitting balance. Pt required most assistance with correcting anterior lean and lateral leans with moderate challenges. Pt also participated in ball taps with 1lb dowel x 20 with pt maintaining minA with challenges. BP checked periodically during session as pt noted to be diaphoretic however BP maintained WNL. Performed SB transfer back to w/c with minA in same manner as prior. Pt transported back to room due to time  management and performed SB transfer back to bed with modA x 2 (uphill). Pt able to use BUE to pull [facility] repositioned to comfort. Pt left in bed with call bell within reach and current needs met.       Therapy Documentation Precautions:  Precautions Precautions: Fall, Cervical Precaution Booklet Issued: No Precaution Comments: Reveiwed precautions with patient Required Braces or Orthoses: Cervical Brace Cervical Brace: Hard collar, At all times Restrictions Weight Bearing Restrictions: No General:   Vital Signs:   Pain: Pain Assessment Pain Scale: 0-10 Pain Score: 7  Pain Type: Acute pain Pain Location: Neck Pain Orientation: Upper Pain Descriptors / Indicators: Stabbing Pain Frequency: Constant Pain Onset: With Activity Patients Stated Pain Goal: 5 Pain Intervention(s): Medication (See eMAR)   Therapy/Group: Individual Therapy  Karis Emig  Tkeya Stencil, PTA  05/25/2019, 3:42 PM

## 2019-05-25 NOTE — Plan of Care (Signed)
  Problem: Consults Goal: RH SPINAL CORD INJURY PATIENT EDUCATION Description:  See Patient Education module for education specifics.  Outcome: Progressing   Problem: SCI BOWEL ELIMINATION Goal: RH STG MANAGE BOWEL WITH ASSISTANCE Description: STG Manage Bowel with max Assistance. Outcome: Progressing Goal: RH STG SCI MANAGE BOWEL WITH MEDICATION WITH ASSISTANCE Description: STG SCI Manage bowel with medication with max assistance. Outcome: Progressing   Problem: SCI BLADDER ELIMINATION Goal: RH STG MANAGE BLADDER WITH ASSISTANCE Description: STG Manage Bladder With max Assistance Outcome: Progressing   Problem: RH SKIN INTEGRITY Goal: RH STG SKIN FREE OF INFECTION/BREAKDOWN Description: Skin to remain free from breakdown with max assist while on rehab. Outcome: Progressing

## 2019-05-26 ENCOUNTER — Inpatient Hospital Stay (HOSPITAL_COMMUNITY): Payer: Self-pay | Admitting: Physical Therapy

## 2019-05-26 ENCOUNTER — Inpatient Hospital Stay (HOSPITAL_COMMUNITY): Payer: Self-pay

## 2019-05-26 ENCOUNTER — Inpatient Hospital Stay (HOSPITAL_COMMUNITY): Payer: Self-pay | Admitting: *Deleted

## 2019-05-26 DIAGNOSIS — S31000A Unspecified open wound of lower back and pelvis without penetration into retroperitoneum, initial encounter: Secondary | ICD-10-CM

## 2019-05-26 DIAGNOSIS — E559 Vitamin D deficiency, unspecified: Secondary | ICD-10-CM

## 2019-05-26 HISTORY — DX: Unspecified open wound of lower back and pelvis without penetration into retroperitoneum, initial encounter: S31.000A

## 2019-05-26 HISTORY — DX: Vitamin D deficiency, unspecified: E55.9

## 2019-05-26 NOTE — Plan of Care (Signed)
  Problem: Consults Goal: RH SPINAL CORD INJURY PATIENT EDUCATION Description:  See Patient Education module for education specifics.  Outcome: Progressing   Problem: SCI BLADDER ELIMINATION Goal: RH STG MANAGE BLADDER WITH ASSISTANCE Description: STG Manage Bladder With max Assistance Outcome: Progressing   Problem: RH SKIN INTEGRITY Goal: RH STG SKIN FREE OF INFECTION/BREAKDOWN Description: Skin to remain free from breakdown with max assist while on rehab. Outcome: Progressing Goal: RH STG MAINTAIN SKIN INTEGRITY WITH ASSISTANCE Description: STG Maintain Skin Integrity With max Assistance. Outcome: Progressing

## 2019-05-26 NOTE — Progress Notes (Signed)
Physical Therapy Session Note  Patient Details  Name: [name] Ford MRN: [id number] Date of Birth: [birth year]  Today's Date: 05/26/2019 PT Individual Time: 1005-1100 PT Individual Time Calculation (min): 55 min   Short Term Goals: Week 3:  PT Short Term Goal 1 (Week 3): Pt will perform or request assist w/pressure relief on schedule 2 min every 30 min PT Short Term Goal 2 (Week 3): Pt will perform bed mobility with assist x 1 consistently PT Short Term Goal 3 (Week 3): Pt will perform manual w/c propulsion x 150 ft at Supervision level  Skilled Therapeutic Interventions/Progress Updates:   Pt received supine in bed and agreeable to PT. Supine>sit transfer with mod for BLE management from semireclined position. SB transfer to [facility] with set up and min-mod assist for safety and LE management to allow improved positioning into seat.   Pt transported to sink. Oral and facial hygiene performed with set up assist at sink. Pt reports smelling like he had an incontinent bowel movement. Upon inspection, noted (+) void. SB transfer to bed at elevated height with mod assist for safety; pt able to instruct transfer and correct set up errors without assist from PT. Sit>supine with mod assist for LE management. Rolling R and L with mod assist for LE control x 8 for personal hygiene with mod assist overall.   BLE PROM for improved ability to attain long sitting. Heel cord stretch 3x 2 min. HS stretch 2 x 2 min with increasing overpressure.   Pt left in bed with call bell in reach and all needs met.       Therapy Documentation Precautions:  Precautions Precautions: Fall, Cervical Precaution Booklet Issued: No Precaution Comments: Reveiwed precautions with patient Required Braces or Orthoses: Cervical Brace Cervical Brace: Hard collar, At all times Restrictions Weight Bearing Restrictions: No    Vital Signs: Therapy Vitals Temp: 98.1 F (36.7 C) Temp Source: Oral Pulse Rate: (!) 49 Resp: 18 BP:  127/83 Patient Position (if appropriate): Sitting Oxygen Therapy SpO2: 100 % O2 Device: Room Air Pain: Pain Assessment Pain Scale: 0-10 Pain Score: 8  Pain Type: Acute pain Pain Location: Back Pain Descriptors / Indicators: Aching;Stabbing Pain Frequency: Constant Pain Onset: On-going Patients Stated Pain Goal: 4 Pain Intervention(s): Medication (See eMAR)   Therapy/Group: Individual Therapy  Lorie Phenix 05/26/2019, 5:04 PM

## 2019-05-26 NOTE — Progress Notes (Signed)
Bowel program provided per orders and tolerated well, lage amount of brownish stool, Incontinent care provided, po fluids encouraged.

## 2019-05-26 NOTE — Progress Notes (Signed)
Occupational Therapy Session Note  Patient Details  Name: [name] Ford MRN: [id number] Date of Birth: 06/20/1985  Today's Date: 05/26/2019 OT Individual Time: 1430-1455 OT Individual Time Calculation (min): 25 min    Short Term Goals: Week 1:  OT Short Term Goal 1 (Week 1): Pt will maintain sitting balance with max A of 1 caregiver for 5 min to demo improved balance OT Short Term Goal 1 - Progress (Week 1): Progressing toward goal OT Short Term Goal 2 (Week 1): Pt will complete 2/4 steps of UB dressing with S OT Short Term Goal 2 - Progress (Week 1): Progressing toward goal OT Short Term Goal 3 (Week 1): Pt will complete transfer to w/c wiht MAX A of 1 caregiver in prep for [facility] transfer OT Short Term Goal 3 - Progress (Week 1): Progressing toward goal  Skilled Therapeutic Interventions/Progress Updates:    1;1. Pt received in w/c agreeable to session. Pt completes UB therex with 2# dowel rod for global shoulder strengthening shoulder flex/ext, chest press, bicep curl and circles forward with w/c reclined partially to decrease stress on back inciison. Pt completes slide board transfer with MOD A +2 and total A to manage LEs into bed despite leg loops and cueing. Exited session with pt seated in bed, exit alarm on and call light inr each   Therapy Documentation Precautions:  Precautions Precautions: Fall, Cervical Precaution Booklet Issued: No Precaution Comments: Reveiwed precautions with patient Required Braces or Orthoses: Cervical Brace Cervical Brace: Hard collar, At all times Restrictions Weight Bearing Restrictions: No General:   Vital Signs:   Pain: Pain Assessment Pain Score: 5  ADL:   Vision   Perception    Praxis   Exercises:   Other Treatments:     Therapy/Group: Individual Therapy  Shon Hale 05/26/2019, 2:57 PM

## 2019-05-26 NOTE — Progress Notes (Signed)
Occupational Therapy Session Note  Patient Details  Name: Jyren Cerasoli MRN: [id number] Date of Birth: Nov 21, 1985  Today's Date: 05/26/2019 OT Individual Time: 1300-1330 OT Individual Time Calculation (min): 30 min    Short Term Goals: Week 1:  OT Short Term Goal 1 (Week 1): Pt will maintain sitting balance with max A of 1 caregiver for 5 min to demo improved balance OT Short Term Goal 1 - Progress (Week 1): Progressing toward goal OT Short Term Goal 2 (Week 1): Pt will complete 2/4 steps of UB dressing with S OT Short Term Goal 2 - Progress (Week 1): Progressing toward goal OT Short Term Goal 3 (Week 1): Pt will complete transfer to w/c wiht MAX A of 1 caregiver in prep for [facility] transfer OT Short Term Goal 3 - Progress (Week 1): Progressing toward goal  Skilled Therapeutic Interventions/Progress Updates:    Pt resting in bed upon arrival with fiancee present.  Leg loops donned.  Pt agreeable to getting OOB and remaining in w/c until next therapy at 1430. OT intervention with focus on bed mobility including BLE management with leg loops, sitting EOB, slide board transfer, and w/c mobility.  Pt moved BLE off EOB with min A for UB balance during task.  Static sitting balance EOB with supervision, min A for lean to place slide board. Slide board transfer to w/c with min A downhill.  W/c mobility with supervision including turning and exiting room.  Pt remained in w/c with all needs within reach and fiancee present.   Therapy Documentation Precautions:  Precautions Precautions: Fall, Cervical Precaution Booklet Issued: No Precaution Comments: Reveiwed precautions with patient Required Braces or Orthoses: Cervical Brace Cervical Brace: Hard collar, At all times Restrictions Weight Bearing Restrictions: No   Pain: Pain Assessment Pain Score: 5 -back of neck repositioned   Therapy/Group: Individual Therapy  [name] 05/26/2019, 2:13 PM

## 2019-05-26 NOTE — Progress Notes (Signed)
Recreational Therapy Session Note  Patient Details  Name: [name] Ford MRN: [id number] Date of Birth: [birth year] Today's Date: 05/26/2019 Time:   830-9 Pain: intermittent c/o neck pain, unrated, repositioning helped Skilled Therapeutic Interventions/Progress Updates: PT assisting pt with LB bathing/dressing upon arrival.  Session focused on completing LB dressing in bed during co-treat with PT with plans to get up to w/c.  Pt performed bed mobility/rolling using bed rails and assistance.  LB dressing including brief, pants, THTs, shoes & leg loops with total assist.  Pt requesting to stay in bed until next therapy session in an hour with plans to sit up til lunch after that session.  Therapy/Group: Co-Treatment  [name] Ford 05/26/2019, 12:05 PM

## 2019-05-26 NOTE — Progress Notes (Signed)
Patient was successful with self craterization and supervision with sterile technique reinforced
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
## 2019-05-26 NOTE — Progress Notes (Signed)
Physical Therapy Weekly Progress Note  Patient Details  Name: [name] Ford MRN: [id number] Date of Birth: Apr 21, 1985  Beginning of progress report period: May 19, 2019 End of progress report period: May 26, 2019  Today's Date: 05/26/2019 PT Individual Time: 0800-0900 PT Individual Time Calculation (min): 60 min   Patient has met 3 of 4 short term goals. Pt exhibits improved tolerance for therapy, improved participation in therapy sessions, and improved therapy buy-in over the past week as has overall exhibited some functional improvement with improved sitting balance and ability to perform transfers via slide board. Pt is able to perform rolling in bed with mod A with use of bedrails, supine to/from sit with anywhere from mod A with HOB elevated and use of leg loops up to assist x 2, can perform slide board transfers with as little assist as min A across a level surface up to assist x 2 going uphill, and has been able to perform w/c mobility x 60 ft at Supervision level.  Patient continues to demonstrate the following deficits muscle weakness and muscle paralysis, decreased cardiorespiratoy endurance, abnormal tone and unbalanced muscle activation and decreased sitting balance, decreased postural control and decreased balance strategies and therefore will continue to benefit from skilled PT intervention to increase functional independence with mobility.  Patient progressing toward long term goals..  Continue plan of care.  PT Short Term Goals Week 2:  PT Short Term Goal 1 (Week 2): Pt will roll L/R w/use of leg loops (fiancee agreed to purchase dog collars) w/max assist using momentum PT Short Term Goal 1 - Progress (Week 2): Met PT Short Term Goal 2 (Week 2): Pt will demonstrate static sitting on mat greater than 1 min w/supervision PT Short Term Goal 2 - Progress (Week 2): Met PT Short Term Goal 3 (Week 2): Pt will perform level transfers w/sliding board w/mod assist PT Short Term Goal 3 -  Progress (Week 2): Met PT Short Term Goal 4 (Week 2): Pt will perform or request assist w/pressure relief on schedule 47mn every 334m PT Short Term Goal 4 - Progress (Week 2): Progressing toward goal Week 3:  PT Short Term Goal 1 (Week 3): Pt will perform or request assist w/pressure relief on schedule 2 min every 30 min PT Short Term Goal 2 (Week 3): Pt will perform bed mobility with assist x 1 consistently PT Short Term Goal 3 (Week 3): Pt will perform manual w/c propulsion x 150 ft at Supervision level  Skilled Therapeutic Interventions/Progress Updates:    Pt received seated in bed, agreeable to PT session. Pt reports some pain in neck, not rated. RN notified and pt unable to receive pain medication until later this AM, use of kpad for pain management this session. Pt eating breakfast during session so provided education while he was eating. Discussed autonomic dysreflexia and provided wallet card handout with information with regards to signs/symptoms, causes, treatment, etc. Will continue education with regards to AD with patient and family. Also provided handout for BLE PROM stretching program, will continue further education with patient's family when they are present. Once pt done eating breakfast agreeable to participate in therapy session. Pt is dependent for LB bathing and dressing with TEDs, pants, leg loops, and shoes. Pt is setup A for front pericare, found to be incontinent of bowel. Pt is mod A for rolling with use of bedrails for dependent pericare and brief change. Pt originally agreeable to get up to w/c at end of session but changes his  mind despite encouragement to increase time spent out of bed. Reviewed pressure relief schedule with patient for when he is seated in w/c. Pt left seated in bed with needs in reach at end of session. Cotreatment session with RT.  Therapy Documentation Precautions:  Precautions Precautions: Fall, Cervical Precaution Booklet Issued: No Precaution  Comments: Reveiwed precautions with patient Required Braces or Orthoses: Cervical Brace Cervical Brace: Hard collar, At all times Restrictions Weight Bearing Restrictions: No   Therapy/Group: Individual Therapy   Excell Seltzer, PT, DPT  05/26/2019, 12:07 PM

## 2019-05-27 ENCOUNTER — Inpatient Hospital Stay (HOSPITAL_COMMUNITY): Payer: Self-pay | Admitting: Physical Therapy

## 2019-05-27 ENCOUNTER — Inpatient Hospital Stay (HOSPITAL_COMMUNITY): Payer: Self-pay | Admitting: *Deleted

## 2019-05-27 ENCOUNTER — Ambulatory Visit (HOSPITAL_COMMUNITY): Payer: Self-pay | Admitting: *Deleted

## 2019-05-27 NOTE — Plan of Care (Signed)
  Problem: Consults Goal: RH SPINAL CORD INJURY PATIENT EDUCATION Description:  See Patient Education module for education specifics.  Outcome: Progressing Goal: Skin Care Protocol Initiated - if Braden Score 18 or less Description: If consults are not indicated, leave blank or document N/A Outcome: Progressing   Problem: SCI BOWEL ELIMINATION Goal: RH STG MANAGE BOWEL WITH ASSISTANCE Description: STG Manage Bowel with max Assistance. Outcome: Progressing Goal: RH STG SCI MANAGE BOWEL WITH MEDICATION WITH ASSISTANCE Description: STG SCI Manage bowel with medication with max assistance. Outcome: Progressing Goal: RH STG SCI MANAGE BOWEL PROGRAM W/ASSIST OR AS APPROPRIATE Description: STG SCI Manage bowel program w/ max assist or as appropriate. Outcome: Progressing   Problem: SCI BLADDER ELIMINATION Goal: RH STG MANAGE BLADDER WITH ASSISTANCE Description: STG Manage Bladder With max Assistance Outcome: Progressing   Problem: RH SKIN INTEGRITY Goal: RH STG SKIN FREE OF INFECTION/BREAKDOWN Description: Skin to remain free from breakdown with max assist while on rehab. Outcome: Progressing Goal: RH STG MAINTAIN SKIN INTEGRITY WITH ASSISTANCE Description: STG Maintain Skin Integrity With max Assistance. Outcome: Progressing Goal: RH STG ABLE TO PERFORM INCISION/WOUND CARE W/ASSISTANCE Description: STG Able To Perform Incision/Wound Care With max Assistance. Outcome: Progressing   Problem: RH SAFETY Goal: RH STG ADHERE TO SAFETY PRECAUTIONS W/ASSISTANCE/DEVICE Description: STG Adhere to Safety Precautions With max Assistance appropriate assistive Device. Outcome: Progressing   Problem: RH PAIN MANAGEMENT Goal: RH STG PAIN MANAGED AT OR BELOW PT'S PAIN GOAL Description: <4 on a 0-10 pain scale. Outcome: Progressing   Problem: RH KNOWLEDGE DEFICIT SCI Goal: RH STG INCREASE KNOWLEDGE OF SELF CARE AFTER SCI Description: Patient and caregiver will demonstrate knowledge of  medication management, bowel and bladder management, skin care management, and follow up care with the MD post discharge with min assist from staff. Outcome: Progressing   

## 2019-05-27 NOTE — Progress Notes (Signed)
Bowel program administered with large amount of soft brown stool, tolerated well, incontinent care provided and repositioned

## 2019-05-27 NOTE — Progress Notes (Signed)
Physical Therapy Session Note  Patient Details  Name: [name] Ford MRN: [id number] Date of Birth: 10/21/1985  Today's Date: 05/27/2019 PT Individual Time: 0800-0900 PT Individual Time Calculation (min): 60 min   Short Term Goals: Week 3:  PT Short Term Goal 1 (Week 3): Pt will perform or request assist w/pressure relief on schedule 2 min every 30 min PT Short Term Goal 2 (Week 3): Pt will perform bed mobility with assist x 1 consistently PT Short Term Goal 3 (Week 3): Pt will perform manual w/c propulsion x 150 ft at Supervision level  Skilled Therapeutic Interventions/Progress Updates:    Pt received seated in bed, agreeable to PT session. No complaints of pain, does report soreness in neck at rest. Session focus on bathing and dressing at bed level. Pt is dependent for brief change, bathing of buttocks and LB, and donning of pants, TEDs, shoes, and leg lifters for time conservation. Pt is able to come to long-sitting in bed with use of B bedrails and able to doff tank top and tshirt and don new ones with min A overall. Pt is setup A for UB and front periarea bathing and donning of deodorant and lotion. Pt requires assist for adjustment of c-collar. Pt is able to direct caregiver throughout session for retrieval of clothing items and body wash. Pt left seated in bed with needs in reach at end of session.  Therapy Documentation Precautions:  Precautions Precautions: Fall, Cervical Precaution Booklet Issued: No Precaution Comments: Reveiwed precautions with patient Required Braces or Orthoses: Cervical Brace Cervical Brace: Hard collar, At all times Restrictions Weight Bearing Restrictions: No    Therapy/Group: Individual Therapy   Peter Congo, PT, DPT  05/27/2019, 12:01 PM

## 2019-05-27 NOTE — Progress Notes (Signed)
Recreational Therapy Session Note  Patient Details  Name: [name] Ford MRN: [id number] Date of Birth: 10-Nov-1985 Today's Date: 05/27/2019  Pain: no c/o Skilled Therapeutic Interventions/Progress Updates: Session focused on w/c mobility during simulated community outing to [employer] with supervision.  Education provided on how to transport items safely w/c level and pt demonstrated ability to do so with supervision.  Discussed up coming discharge, pt stated he was leaving Wednesday & felt prepared.  Returned to room & pt performed slide board transfer back to bed with assistance of 1. Therapy/Group: Co-Treatment  Adonai Helzer 05/27/2019, 12:14 PM
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
## 2019-05-27 NOTE — Progress Notes (Signed)
Occupational Therapy Session Note  Patient Details  Name: [name] Ford MRN: [id number] Date of Birth: [birth year]  Today's Date: 05/27/2019 OT Individual Time: 1300-1409 OT Individual Time Calculation (min): 69 min    Short Term Goals: Week 3:  OT Short Term Goal 1 (Week 3): Pt will perform level transfer mat<>chair with max A +1 OT Short Term Goal 2 (Week 3): Pt will move BLE off EOB using leg loops with min A OT Short Term Goal 3 (Week 3): Pt will maintain sitting balance EOB with min A for UB dressing tasks  Skilled Therapeutic Interventions/Progress Updates:    Pt resting in bed upon arrival.  Pt declined getting OOB stating that he had already been OOB earlier and propelled w/c around at [facility]. Pt agreed to practicing bed mobility using leg loops.  Pt able to move BLE laterally using leg loops without assistance. Pt also agreed to sit in chair position in bed. Pt stated he was going to go home on Wednesday 4/7.  Discussed importance of boosting when in w/c and repositioning when in bed to prevent skin breakdown. Pt inquired if he would be receiving HHOT/PT or OPOT/PT. I informed pt that he would probably be starting with [facility]. Pt demonstrated ability to return bed to regular operations. Pt remained in chair position with all needs within reach.   Therapy Documentation Precautions:  Precautions Precautions: Fall, Cervical Precaution Booklet Issued: No Precaution Comments: Reveiwed precautions with patient Required Braces or Orthoses: Cervical Brace Cervical Brace: Hard collar, At all times Restrictions Weight Bearing Restrictions: No Pain:  Pt denies pain this afternoon   Therapy/Group: Individual Therapy  [name] 05/27/2019, 2:10 PM

## 2019-05-27 NOTE — Progress Notes (Signed)
Physical Therapy Session Note  Patient Details  Name: [name] Ford MRN: [id number] Date of Birth: 27-Feb-1985  Today's Date: 05/27/2019 PT Individual Time: 1010-1120 PT Individual Time Calculation (min): 70 min   Short Term Goals: Week 3:  PT Short Term Goal 1 (Week 3): Pt will perform or request assist w/pressure relief on schedule 2 min every 30 min PT Short Term Goal 2 (Week 3): Pt will perform bed mobility with assist x 1 consistently PT Short Term Goal 3 (Week 3): Pt will perform manual w/c propulsion x 150 ft at Supervision level  Skilled Therapeutic Interventions/Progress Updates:   Pt received supine in bed and agreeable to PT. Rolling Rwith mod assist to check for incontinence. No incontinence noted. Rolling to the L with mod assist and BLE management Supine>sit transfer with max assist for LE and trunk management due to posterior LOB. Sitting balance EOB with mod assist progressing to CGA once BLE positioned appropriately. SB transfer to [facility] with min assist and increased time for LE management and cues for adequate anterior weight shift. WC mobility in room with supervision assist to sink. A Pt performed facial hygiene and oral care at sink with set up assist. Min cues for UE stabilized on sink to allow anterior lean to spit in sink.   WC mobility on unit x 143f with supervision assist. Rec therapist present for remainder of treatment. Pt transported to [employer] bread. PT instructed [facility]  To grab flatbread and dessert off counter, then performed WC mobility through [employer] 1553[employer] elevator. Pt took over with pt reporting upper trap pain.   Pt returned to room and performed SB transfer to bed with Mod assist for safety and trunk control. Sit>supine completed with max assist due to excessive speed with transition into supine. pt left supine in bed with call bell in reach and all needs met.        Therapy  Documentation Precautions:  Precautions Precautions: Fall, Cervical Precaution Booklet Issued: No Precaution Comments: Reveiwed precautions with patient Required Braces or Orthoses: Cervical Brace Cervical Brace: Hard collar, At all times Restrictions Weight Bearing Restrictions: No Pain:   denies at rest  Therapy/Group: Individual Therapy  AuLorie Phenix/03/2019, 12:23 PM

## 2019-05-27 NOTE — Progress Notes (Signed)
Occupational Therapy Weekly Progress Note  Patient Details  Name: [name] Ford MRN: [id number] Date of Birth: 04-Jan-1986  Beginning of progress report period: May 20, 2019 End of progress report period: May 27, 2019  Patient has met 2 of 3 short term goals.  Pt made steady progress with bed mobility, slide board transfers, sitting balance, bathing, and UB dressing tasks during the past week.  Pt initially stated he was only willing to remain in hospital through Easter but has now tentatively agreed to remain through his scheduled d/c date. Pt has begun using leg loops for BLE management and requires mod A for moving BLE off EOB while maintaining sitting balance. Pt performs bathing tasks with min A at bed level. Pt performs slide board transfer bed>w/c downhill with min A+2 and mod A+2 uphill w/c>bed.  Pt requires max A for sit>supine. Pt is making steady progress and actively participating in therapies.   Patient continues to demonstrate the following deficits: muscle weakness and muscle paralysis, decreased cardiorespiratoy endurance, unbalanced muscle activation and decreased sitting balance, decreased postural control and decreased balance strategies and therefore will continue to benefit from skilled OT intervention to enhance overall performance with BADL.  Patient progressing toward long term goals..  Continue plan of care.  OT Short Term Goals Week 2:  OT Short Term Goal 1 (Week 2): Pt will perform UB dressing (shirt) with min A OT Short Term Goal 1 - Progress (Week 2): Met OT Short Term Goal 2 (Week 2): Pt will perform level transfer mat<>chair with max A +1 OT Short Term Goal 2 - Progress (Week 2): Progressing toward goal OT Short Term Goal 3 (Week 2): Pt will tolerate being out of bed for ~2-3 hrs a day to improved ability to participate in functinal tasks out of the bed. OT Short Term Goal 3 - Progress (Week 2): Met Week 3:  OT Short Term Goal 1 (Week 3): Pt will perform level  transfer mat<>chair with max A +1 OT Short Term Goal 2 (Week 3): Pt will move BLE off EOB using leg loops with min A OT Short Term Goal 3 (Week 3): Pt will maintain sitting balance EOB with min A for UB dressing tasks   [name] 05/27/2019, 6:51 AM

## 2019-05-28 NOTE — Progress Notes (Signed)
Patient allowed staff to assist with I/O cath. Cath patient for . Patient refuses to have bowel program done at 6pm. Requesting to be done later in shift. Educated patient on importance of keeping up with bowel program each evening at the same time. Reported to night shift nurse. Leane Para, LPN
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
## 2019-05-28 NOTE — Progress Notes (Addendum)
Patient is due for bowel program and q6h bladder scan and I/O cath at 1830. Patient requested staff to "go away" stating "Not right now, I am mad, I do not want to right now." Will follow up with night shift nurse. Leane Para, LPN

## 2019-05-28 NOTE — Plan of Care (Signed)
  Problem: Consults Goal: RH SPINAL CORD INJURY PATIENT EDUCATION Description:  See Patient Education module for education specifics.  Outcome: Progressing Goal: Skin Care Protocol Initiated - if Braden Score 18 or less Description: If consults are not indicated, leave blank or document N/A Outcome: Progressing   Problem: SCI BOWEL ELIMINATION Goal: RH STG MANAGE BOWEL WITH ASSISTANCE Description: STG Manage Bowel with max Assistance. Outcome: Progressing Goal: RH STG SCI MANAGE BOWEL WITH MEDICATION WITH ASSISTANCE Description: STG SCI Manage bowel with medication with max assistance. Outcome: Progressing Goal: RH STG SCI MANAGE BOWEL PROGRAM W/ASSIST OR AS APPROPRIATE Description: STG SCI Manage bowel program w/ max assist or as appropriate. Outcome: Progressing   Problem: SCI BLADDER ELIMINATION Goal: RH STG MANAGE BLADDER WITH ASSISTANCE Description: STG Manage Bladder With max Assistance Outcome: Progressing   Problem: RH SKIN INTEGRITY Goal: RH STG SKIN FREE OF INFECTION/BREAKDOWN Description: Skin to remain free from breakdown with max assist while on rehab. Outcome: Progressing Goal: RH STG MAINTAIN SKIN INTEGRITY WITH ASSISTANCE Description: STG Maintain Skin Integrity With max Assistance. Outcome: Progressing Goal: RH STG ABLE TO PERFORM INCISION/WOUND CARE W/ASSISTANCE Description: STG Able To Perform Incision/Wound Care With max Assistance. Outcome: Progressing   Problem: RH SAFETY Goal: RH STG ADHERE TO SAFETY PRECAUTIONS W/ASSISTANCE/DEVICE Description: STG Adhere to Safety Precautions With max Assistance appropriate assistive Device. Outcome: Progressing   Problem: RH PAIN MANAGEMENT Goal: RH STG PAIN MANAGED AT OR BELOW PT'S PAIN GOAL Description: <4 on a 0-10 pain scale. Outcome: Progressing   Problem: RH KNOWLEDGE DEFICIT SCI Goal: RH STG INCREASE KNOWLEDGE OF SELF CARE AFTER SCI Description: Patient and caregiver will demonstrate knowledge of  medication management, bowel and bladder management, skin care management, and follow up care with the MD post discharge with min assist from staff. Outcome: Progressing   

## 2019-05-28 NOTE — Progress Notes (Signed)
Patient refused bowel program ( digital stimulation and suppository) states he had a large BM own his own already around 7pm.

## 2019-05-29 ENCOUNTER — Inpatient Hospital Stay (HOSPITAL_COMMUNITY): Payer: Self-pay | Admitting: Physical Therapy

## 2019-05-29 ENCOUNTER — Inpatient Hospital Stay (HOSPITAL_COMMUNITY): Payer: Self-pay

## 2019-05-29 ENCOUNTER — Inpatient Hospital Stay (HOSPITAL_COMMUNITY): Payer: Self-pay | Admitting: Occupational Therapy

## 2019-05-29 NOTE — Progress Notes (Signed)
Patient refused scheduled suppository. Patient states he has had suppository around 930 at night each time and prefers for it to be at that time. Will pass information to night shift. Meredeth Ide Glessie Eustice

## 2019-05-29 NOTE — Plan of Care (Signed)
  Problem: Consults Goal: RH SPINAL CORD INJURY PATIENT EDUCATION Description:  See Patient Education module for education specifics.  05/29/2019 2347 by Wende Crease, LPN Outcome: Progressing 05/29/2019 2346 by Wende Crease, LPN Outcome: Progressing Goal: Skin Care Protocol Initiated - if Braden Score 18 or less Description: If consults are not indicated, leave blank or document N/A 05/29/2019 2347 by Wende Crease, LPN Outcome: Progressing 05/29/2019 2346 by Wende Crease, LPN Outcome: Progressing   Problem: SCI BOWEL ELIMINATION Goal: RH STG MANAGE BOWEL WITH ASSISTANCE Description: STG Manage Bowel with max Assistance. 05/29/2019 2347 by Wende Crease, LPN Outcome: Progressing 05/29/2019 2346 by Wende Crease, LPN Outcome: Progressing Goal: RH STG SCI MANAGE BOWEL WITH MEDICATION WITH ASSISTANCE Description: STG SCI Manage bowel with medication with max assistance. 05/29/2019 2347 by Wende Crease, LPN Outcome: Progressing 05/29/2019 2346 by Wende Crease, LPN Outcome: Progressing Goal: RH STG SCI MANAGE BOWEL PROGRAM W/ASSIST OR AS APPROPRIATE Description: STG SCI Manage bowel program w/ max assist or as appropriate. 05/29/2019 2347 by Wende Crease, LPN Outcome: Progressing 05/29/2019 2346 by Wende Crease, LPN Outcome: Progressing   Problem: SCI BLADDER ELIMINATION Goal: RH STG MANAGE BLADDER WITH ASSISTANCE Description: STG Manage Bladder With max Assistance 05/29/2019 2347 by Wende Crease, LPN Outcome: Progressing 05/29/2019 2346 by Wende Crease, LPN Outcome: Progressing   Problem: RH SKIN INTEGRITY Goal: RH STG SKIN FREE OF INFECTION/BREAKDOWN Description: Skin to remain free from breakdown with max assist while on rehab. 05/29/2019 2347 by Wende Crease, LPN Outcome: Progressing 05/29/2019 2346 by Wende Crease, LPN Outcome: Progressing Goal: RH STG MAINTAIN SKIN INTEGRITY WITH ASSISTANCE Description: STG Maintain Skin Integrity With max Assistance. 05/29/2019 2347 by Wende Crease, LPN Outcome: Progressing 05/29/2019 2346 by Wende Crease, LPN Outcome: Progressing Goal: RH STG ABLE TO PERFORM INCISION/WOUND CARE W/ASSISTANCE Description: STG Able To Perform Incision/Wound Care With max Assistance. 05/29/2019 2347 by Wende Crease, LPN Outcome: Progressing 05/29/2019 2346 by Wende Crease, LPN Outcome: Progressing   Problem: RH SAFETY Goal: RH STG ADHERE TO SAFETY PRECAUTIONS W/ASSISTANCE/DEVICE Description: STG Adhere to Safety Precautions With max Assistance appropriate assistive Device. 05/29/2019 2347 by Wende Crease, LPN Outcome: Progressing 05/29/2019 2346 by Wende Crease, LPN Outcome: Progressing   Problem: RH PAIN MANAGEMENT Goal: RH STG PAIN MANAGED AT OR BELOW PT'S PAIN GOAL Description: <4 on a 0-10 pain scale. 05/29/2019 2347 by Wende Crease, LPN Outcome: Progressing 05/29/2019 2346 by Wende Crease, LPN Outcome: Progressing   Problem: RH KNOWLEDGE DEFICIT SCI Goal: RH STG INCREASE KNOWLEDGE OF SELF CARE AFTER SCI Description: Patient and caregiver will demonstrate knowledge of medication management, bowel and bladder management, skin care management, and follow up care with the MD post discharge with min assist from staff. 05/29/2019 2347 by Wende Crease, LPN Outcome: Progressing 05/29/2019 2346 by Wende Crease, LPN Outcome: Progressing
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
## 2019-05-29 NOTE — Progress Notes (Signed)
Occupational Therapy Session Note  Patient Details  Name: [name] Ford MRN: [id number] Date of Birth: 19-Mar-1985  Today's Date: 05/29/2019 OT Individual Time: 0800-0900 OT Individual Time Calculation (min): 60 min    Short Term Goals: Week 3:  OT Short Term Goal 1 (Week 3): Pt will perform level transfer mat<>chair with max A +1 OT Short Term Goal 2 (Week 3): Pt will move BLE off EOB using leg loops with min A OT Short Term Goal 3 (Week 3): Pt will maintain sitting balance EOB with min A for UB dressing tasks  Skilled Therapeutic Interventions/Progress Updates:    Pt resting in bed upon arrival and initially resistant to participating in therapy.  Pt stated he needed to have some rest days to heal and recover.  Explained guidelines for inpatient rehab and governmental/insurance directives. Pt verbalized understanding and willingly engaged in bathing/dressing tasks at bed level. Pt with small incontinent BM and required tot A for hygiene.  Pt able to bathe front periarea.  Pt requires max A+2 for rolling R>L using bed rails.  Pt able to pull self up to [facility] without assistance. Pt tot A for donning pants. Pt able to pull into unsupported sitting but required BUE support on bedrails to maintain. Pt completed UB bathing tasks with supervision and mod A for UB dressing tasks.  Pt commented that he was glad he decided to "go ahead" and start his day with bathing/dressing.  Pt remained in bed with all needs within reach.  OT intervention with focus on bed mobility and BADL retraining at bed level.   Therapy Documentation Precautions:  Precautions Precautions: Fall, Cervical Precaution Booklet Issued: No Precaution Comments: Reveiwed precautions with patient Required Braces or Orthoses: Cervical Brace Cervical Brace: Hard collar, At all times Restrictions Weight Bearing Restrictions: No  Pain: Pain Assessment Pain Scale: 0-10 Pain Score: 3  Pain Type: Acute pain Pain Location: Back Pain  Orientation: Upper Pain Descriptors / Indicators: Aching Pain Frequency: Constant Pain Onset: On-going Patients Stated Pain Goal: 2 Pain Intervention(s): Repositioned   Therapy/Group: Individual Therapy  [name] 05/29/2019, 9:08 AM
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
## 2019-05-29 NOTE — Progress Notes (Signed)
Occupational Therapy Session Note  Patient Details  Name: [name] Ford MRN: [id number] Date of Birth: Dec 31, 1985  Today's Date: 05/29/2019 OT Individual Time: 7902-4097 OT Individual Time Calculation (min): 71 min   Short Term Goals: Week 3:  OT Short Term Goal 1 (Week 3): Pt will perform level transfer mat<>chair with max A +1 OT Short Term Goal 2 (Week 3): Pt will move BLE off EOB using leg loops with min A OT Short Term Goal 3 (Week 3): Pt will maintain sitting balance EOB with min A for UB dressing tasks  Skilled Therapeutic Interventions/Progress Updates:    Pt greeted in bed and premedicated for pain. Agreeable to session, started by checking brief with noted bowel incontinence. Pt able to roll with Mod A Rt>Lt for perihygiene and brief change, 2nd helper assisted with hygiene and changing brief. Pt able to complete frontal hygiene with setup and HOB raised. Once his pants were elevated, pt able to boost himself up towards [facility] and then pull himself into long sitting using the bedrails. Abdominal binder was donned at this time. Pt assisted with donning leg loops and then transitioned to EOB with steady assist. He then reported feeling dizzy (rated 6/10 in severity). BP 123/78. He wanted to transfer to the w/c vs return to bed. Min-Mod A of 2 for slideboard<TIS. Pt was then escorted to the outdoor courtyard, en transit he purchased a drink from the vending machines while w/c level. When sitting outside, pt pleasantly conversed about his daughters and how eager he was to see them again this week. He reported dizziness was "much better now" and he attributed this to lying in the bed for most of the day yesterday. Once pt was returned to the room he was agreeable to remain up in the w/c for his next therapy session. Left him with all needs within reach.   Therapy Documentation Precautions:  Precautions Precautions: Fall, Cervical Precaution Booklet Issued: No Precaution Comments: Reveiwed  precautions with patient Required Braces or Orthoses: Cervical Brace Cervical Brace: Hard collar, At all times Restrictions Weight Bearing Restrictions: No ADL:       Therapy/Group: Individual Therapy  Dawid Dupriest A Wahneta Derocher 05/29/2019, 12:37 PM

## 2019-05-30 ENCOUNTER — Inpatient Hospital Stay (HOSPITAL_COMMUNITY): Payer: Self-pay | Admitting: Physical Therapy

## 2019-05-30 ENCOUNTER — Inpatient Hospital Stay (HOSPITAL_COMMUNITY): Payer: Self-pay

## 2019-05-30 LAB — BASIC METABOLIC PANEL
Anion gap: 10 (ref 5–15)
BUN: 17 mg/dL (ref 6–20)
CO2: 25 mmol/L (ref 22–32)
Calcium: 8.8 mg/dL — ABNORMAL LOW (ref 8.9–10.3)
Chloride: 104 mmol/L (ref 98–111)
Creatinine, Ser: 0.97 mg/dL (ref 0.61–1.24)
GFR calc Af Amer: >60 mL/min (ref >60)
GFR calc non Af Amer: >60 mL/min (ref >60)
Glucose, Bld: 114 mg/dL — ABNORMAL HIGH (ref 70–99)
Potassium: 3.8 mmol/L (ref 3.5–5.1)
Sodium: 139 mmol/L (ref 135–145)

## 2019-05-30 LAB — URINALYSIS, ROUTINE W REFLEX MICROSCOPIC
Bilirubin Urine: NEGATIVE
Glucose, UA: NEGATIVE mg/dL
Ketones, ur: NEGATIVE mg/dL
Nitrite: POSITIVE — AB
Protein, ur: 30 mg/dL — AB
RBC / HPF: >50 RBC/hpf — ABNORMAL HIGH (ref 0–5)
Specific Gravity, Urine: 1.01 (ref 1.005–1.030)
pH: 5 (ref 5.0–8.0)

## 2019-05-30 LAB — CBC
HCT: 33 % — ABNORMAL LOW (ref 39.0–52.0)
Hemoglobin: 10.8 g/dL — ABNORMAL LOW (ref 13.0–17.0)
MCH: 30.7 pg (ref 26.0–34.0)
MCHC: 32.7 g/dL (ref 30.0–36.0)
MCV: 93.8 fL (ref 80.0–100.0)
Platelets: 329 10*3/uL (ref 150–400)
RBC: 3.52 MIL/uL — ABNORMAL LOW (ref 4.22–5.81)
RDW: 13.2 % (ref 11.5–15.5)
WBC: 6.3 10*3/uL (ref 4.0–10.5)
nRBC: 0 % (ref 0.0–0.2)

## 2019-05-30 NOTE — Progress Notes (Signed)
Physical Therapy Session Note  Patient Details  Name: [name] Ford MRN: [id number] Date of Birth: 1985-10-01  Today's Date: 05/30/2019 PT Individual Time: 1400-1500 PT Individual Time Calculation (min): 60 min   Short Term Goals: Week 3:  PT Short Term Goal 1 (Week 3): Pt will perform or request assist w/pressure relief on schedule 2 min every 30 min PT Short Term Goal 2 (Week 3): Pt will perform bed mobility with assist x 1 consistently PT Short Term Goal 3 (Week 3): Pt will perform manual w/c propulsion x 150 ft at Supervision level  Skilled Therapeutic Interventions/Progress Updates:    Pt received seated in bed, agreeable with encouragement to participate in PT session. Fleet Contras, ATP present for seating evaluation. Pt is mod A for semi-reclined in bed to sitting EOB for BLE management. Slide board transfer bed to w/c with min A going downhill. Once seated in w/c pt able to participate in seating evaluation. Pt and his fiance are able to engage in conversation with therapist and ATP with regards to preferences of ultra lightweight w/c style, trunk control, etc. Pt then requesting to brush his teeth, is setup A to perform oral hygiene while seated in w/c at sink. Then reviewed education that needs to be covered with his fiance prior to d/c home: PROM, transfers, car transfers, boosting, and bowel/bladder program. Merry Proud reports feeling comfortable with pt's current assist level with bowel and bladder. Pt is max A to transfer w/c to bed going uphill. Demonstrated body mechanics and safe setup of transfer and then how to safely assist pt with this transfer. Pt's fiance Merry Proud is able to perform return demo and assist pt with slide board transfer bed to w/c with min A from therapist and w/c to bed with mod A from therapist. Sit to supine mod A for BLE management. Rolling L/R with min A for placement of chuck pad and removal of abdominal binder. Pt's fiance reports feeling comfortable with transfers  and bed mobility as she has nursing experience. Therapy also recommending a hospital bed for body mechanics with bowel/bladder program, bathing/dressing, for pt independence with bed mobility, and for increased safety with transfers bed to/from chair. Pt and his fiance are declining use of a hospital bed at home upon d/c, understanding of therapy recommendations and reasons for these recommendations. Will continue family education leading up to d/c this week. Pt left seated in bed with needs in reach, fiance present.  Therapy Documentation Precautions:  Precautions Precautions: Fall, Cervical Precaution Booklet Issued: No Precaution Comments: Reveiwed precautions with patient Required Braces or Orthoses: Cervical Brace Cervical Brace: Hard collar, At all times Restrictions Weight Bearing Restrictions: No    Therapy/Group: Individual Therapy   Peter Congo, PT, DPT  05/30/2019, 3:57 PM
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
## 2019-05-30 NOTE — Progress Notes (Signed)
Physical Therapy Session Note  Patient Details  Name: [name] Ford MRN: [id number] Date of Birth: December 28, 1985  Today's Date: 05/30/2019 PT Individual Time: 1603-1700 PT Individual Time Calculation (min): 57 min    Short Term Goals: Week 3:  PT Short Term Goal 1 (Week 3): Pt will perform or request assist w/pressure relief on schedule 2 min every 30 min PT Short Term Goal 2 (Week 3): Pt will perform bed mobility with assist x 1 consistently PT Short Term Goal 3 (Week 3): Pt will perform manual w/c propulsion x 150 ft at Supervision level  Skilled Therapeutic Interventions/Progress Updates:    Pt supine in bed upon PT arrival, agreeable to therapy tx and reports rib pain, L elbow pain during session but does not rate. Pt's significant other present this session for family education. With patient in bed therapist reviewed handout with LE PROM/stretching exercises, therapist demonstrated each exercise and significant other returned demonstration - hip flexion, hamstring stretch, hip abduction/adduction, hip IR/ER, ankle DF. Pt performed UE AROM exercises this session as well. Pt agreeable to get OOB for hands on transfer practice with significant other. Therapist available as a +2 for all transfers, pt requiring mod-max assist for each transfer (significant other providing assist) with therapist providing cues for techniques/slideboard placement. Increased assist needed for incline transfers. Pt transferred from hospital bed<>w/c this session, taken to the rehab apartment and performed w/c<>recliner transfer this session. Pt requesting to go outside for some air, therapist transported pt outside. Discussed the chair he will be getting, therapist provided some education on shoulder overuse injury prevention, also discussed importance of pressure relief. Pt back to unit and left in bed at end of session, needs in reach and bed alarm set.   Therapy Documentation Precautions:  Precautions Precautions:  Fall, Cervical Precaution Booklet Issued: No Precaution Comments: Reveiwed precautions with patient Required Braces or Orthoses: Cervical Brace Cervical Brace: Hard collar, At all times Restrictions Weight Bearing Restrictions: No    Therapy/Group: Individual Therapy  Cresenciano Genre, PT, DPT, CSRS 05/30/2019, 8:17 AM

## 2019-05-30 NOTE — Progress Notes (Signed)
Pt declined nightly bowel program at 6pm. Patient stated he is usually used to doing it at 8:30/9pm. Will follow up with night shift nurse. Leane Para, LPN

## 2019-05-30 NOTE — Plan of Care (Signed)
  Problem: Consults Goal: RH SPINAL CORD INJURY PATIENT EDUCATION Description:  See Patient Education module for education specifics.  Outcome: Progressing Goal: Skin Care Protocol Initiated - if Braden Score 18 or less Description: If consults are not indicated, leave blank or document N/A Outcome: Progressing   Problem: SCI BOWEL ELIMINATION Goal: RH STG MANAGE BOWEL WITH ASSISTANCE Description: STG Manage Bowel with max Assistance. Outcome: Progressing Goal: RH STG SCI MANAGE BOWEL WITH MEDICATION WITH ASSISTANCE Description: STG SCI Manage bowel with medication with max assistance. Outcome: Progressing Goal: RH STG SCI MANAGE BOWEL PROGRAM W/ASSIST OR AS APPROPRIATE Description: STG SCI Manage bowel program w/ max assist or as appropriate. Outcome: Progressing   Problem: SCI BLADDER ELIMINATION Goal: RH STG MANAGE BLADDER WITH ASSISTANCE Description: STG Manage Bladder With max Assistance Outcome: Progressing   Problem: RH SKIN INTEGRITY Goal: RH STG SKIN FREE OF INFECTION/BREAKDOWN Description: Skin to remain free from breakdown with max assist while on rehab. Outcome: Progressing Goal: RH STG MAINTAIN SKIN INTEGRITY WITH ASSISTANCE Description: STG Maintain Skin Integrity With max Assistance. Outcome: Progressing Goal: RH STG ABLE TO PERFORM INCISION/WOUND CARE W/ASSISTANCE Description: STG Able To Perform Incision/Wound Care With max Assistance. Outcome: Progressing   Problem: RH SAFETY Goal: RH STG ADHERE TO SAFETY PRECAUTIONS W/ASSISTANCE/DEVICE Description: STG Adhere to Safety Precautions With max Assistance appropriate assistive Device. Outcome: Progressing   Problem: RH PAIN MANAGEMENT Goal: RH STG PAIN MANAGED AT OR BELOW PT'S PAIN GOAL Description: <4 on a 0-10 pain scale. Outcome: Progressing   Problem: RH KNOWLEDGE DEFICIT SCI Goal: RH STG INCREASE KNOWLEDGE OF SELF CARE AFTER SCI Description: Patient and caregiver will demonstrate knowledge of  medication management, bowel and bladder management, skin care management, and follow up care with the MD post discharge with min assist from staff. Outcome: Progressing   

## 2019-05-30 NOTE — Progress Notes (Signed)
Pt is having C/o white discharge coming from penis. Denies any pain or resistance during self cath. Alerted PA, Dan about pt concerns. Will follow up. Leane Para, LPN

## 2019-05-30 NOTE — Progress Notes (Signed)
Pt is concerned with a white discharged coming from penis. Pt concerned that it might multiple things. This Clinical research associate states that an provider will be notified, however this Clinical research associate can't diagnose what the problem is.

## 2019-05-30 NOTE — Progress Notes (Signed)
Occupational Therapy Session Note  Patient Details  Name: [name] Ford MRN: [id number] Date of Birth: 1985/03/09  Today's Date: 05/30/2019 OT Individual Time: 0800-0900 OT Individual Time Calculation (min): 60 min    Short Term Goals: Week 3:  OT Short Term Goal 1 (Week 3): Pt will perform level transfer mat<>chair with max A +1 OT Short Term Goal 2 (Week 3): Pt will move BLE off EOB using leg loops with min A OT Short Term Goal 3 (Week 3): Pt will maintain sitting balance EOB with min A for UB dressing tasks  Skilled Therapeutic Interventions/Progress Updates:    Pt resting in bed upon arrival.  Pt with small incontinent BM. Dependent for hygiene and changing brief. Pt only wanted to bathe UB and change shirt this morning in addition to donning pants. Pt declined getting OOB this morning. Pt requires assistance with unsupported sitting when doffing/donning shirt.  Pt completes UB bathing without assistance. Pt requires more than a reasonable amount of time to complete all tasks.  Pt remained in bed with all needs within reach and PRAFO on BLE.   Therapy Documentation Precautions:  Precautions Precautions: Fall, Cervical Precaution Booklet Issued: No Precaution Comments: Reveiwed precautions with patient Required Braces or Orthoses: Cervical Brace Cervical Brace: Hard collar, At all times Restrictions Weight Bearing Restrictions: No   Pain: Pt c/o posterior neck discomfort; repositioned   Therapy/Group: Individual Therapy  [name] 05/30/2019, 11:14 AM

## 2019-05-30 NOTE — Plan of Care (Signed)
  Problem: Consults Goal: RH SPINAL CORD INJURY PATIENT EDUCATION Description:  See Patient Education module for education specifics.  Outcome: Progressing Goal: Skin Care Protocol Initiated - if Braden Score 18 or less Description: If consults are not indicated, leave blank or document N/A Outcome: Progressing   Problem: SCI BOWEL ELIMINATION Goal: RH STG MANAGE BOWEL WITH ASSISTANCE Description: STG Manage Bowel with max Assistance. Outcome: Progressing Goal: RH STG SCI MANAGE BOWEL WITH MEDICATION WITH ASSISTANCE Description: STG SCI Manage bowel with medication with max assistance. Outcome: Progressing Goal: RH STG SCI MANAGE BOWEL PROGRAM W/ASSIST OR AS APPROPRIATE Description: STG SCI Manage bowel program w/ max assist or as appropriate. Outcome: Progressing   Problem: SCI BLADDER ELIMINATION Goal: RH STG MANAGE BLADDER WITH ASSISTANCE Description: STG Manage Bladder With max Assistance Outcome: Progressing   Problem: RH SKIN INTEGRITY Goal: RH STG SKIN FREE OF INFECTION/BREAKDOWN Description: Skin to remain free from breakdown with max assist while on rehab. Outcome: Progressing Goal: RH STG MAINTAIN SKIN INTEGRITY WITH ASSISTANCE Description: STG Maintain Skin Integrity With max Assistance. Outcome: Progressing Goal: RH STG ABLE TO PERFORM INCISION/WOUND CARE W/ASSISTANCE Description: STG Able To Perform Incision/Wound Care With max Assistance. Outcome: Progressing   Problem: RH SAFETY Goal: RH STG ADHERE TO SAFETY PRECAUTIONS W/ASSISTANCE/DEVICE Description: STG Adhere to Safety Precautions With max Assistance appropriate assistive Device. Outcome: Progressing   Problem: RH PAIN MANAGEMENT Goal: RH STG PAIN MANAGED AT OR BELOW PT'S PAIN GOAL Description: <4 on a 0-10 pain scale. Outcome: Progressing   Problem: RH KNOWLEDGE DEFICIT SCI Goal: RH STG INCREASE KNOWLEDGE OF SELF CARE AFTER SCI Description: Patient and caregiver will demonstrate knowledge of  medication management, bowel and bladder management, skin care management, and follow up care with the MD post discharge with min assist from staff. Outcome: Progressing

## 2019-05-30 NOTE — Progress Notes (Addendum)
Pt is on a bowel program, dig stem preformed,suppository given, dig stem preformed again, pt didn't have a bowel movement on this shift. LMB still 05/29/2019

## 2019-05-31 ENCOUNTER — Inpatient Hospital Stay (HOSPITAL_COMMUNITY): Payer: Self-pay

## 2019-05-31 ENCOUNTER — Inpatient Hospital Stay (HOSPITAL_COMMUNITY): Payer: Self-pay | Admitting: *Deleted

## 2019-05-31 ENCOUNTER — Inpatient Hospital Stay (HOSPITAL_COMMUNITY): Payer: Self-pay | Admitting: Physical Therapy

## 2019-05-31 MED ORDER — CEPHALEXIN 250 MG PO CAPS
500.0000 mg | ORAL_CAPSULE | Freq: Three times a day (TID) | ORAL | Status: DC
  Administered 2019-05-31 – 2019-06-01 (×4): 500 mg via ORAL
  Filled 2019-05-31 (×4): qty 2

## 2019-05-31 MED ORDER — OXYCODONE-ACETAMINOPHEN 7.5-325 MG PO TABS
1.0000 | ORAL_TABLET | Freq: Four times a day (QID) | ORAL | Status: DC | PRN
  Administered 2019-05-31 – 2019-06-01 (×6): 1 via ORAL
  Filled 2019-05-31 (×6): qty 1

## 2019-05-31 NOTE — Progress Notes (Signed)
Dig stem followed by suppository given , pt had small bowel movement before dig stem. No other bowel movement has been noted.

## 2019-05-31 NOTE — Progress Notes (Signed)
Occupational Therapy Discharge Summary  Patient Details  Name: [name] Ford MRN: [id number] Date of Birth: 09-12-1985  Patient has met 3 of 7 long term goals due to improved activity tolerance, improved balance and ability to compensate for deficits.  Pt progress towards LTGs has been inconsistent during this admission.  Pt has elected to discharge home with s/o 8 days prior to recommended discharge date. Pt completes bathing/dressing tasks at bed level. Pt completes bathing tasks with min A and UB dressing tasks with supervision/occasional min A. Pt requires min A for dynamic sitting balance.  Pt requires max/tot A for LB dressing at bed level.  Pt has not practiced toilet/BSC transfers.  Pt's s/o has been present for therapies and provided the appropriate level of assistance. Pt has declined a recommended hospital bed for use at home. A padded tub bench with cutout has been recommended but pt has not purchased at this time.  Patient's care partner is independent to provide the necessary physical assistance at discharge.    Due to leaving eariler than recommended pt still requires min A for sitting balance. Pt also didn't meet goal of min A for LB dressing and requires max A. Toileting continues to be total A with cathing and with bowel program at bed level. Pt had not progressed to performing these tasks on a padded tub bench.  Recommendation:  Patient will benefit from ongoing skilled OT services in home health setting to continue to advance functional skills in the area of BADL and Reduce care partner burden.  Equipment: Recommended padded tub bench with cutout  Reasons for discharge: discharge from hospital  Patient/family agrees with progress made and goals achieved: Yes  OT Discharge Vision Baseline Vision/History: No visual deficits Patient Visual Report: No change from baseline Vision Assessment?: No apparent visual deficits Perception  Perception: Within Functional  Limits Praxis Praxis: Intact Cognition Overall Cognitive Status: Within Functional Limits for tasks assessed Arousal/Alertness: Awake/alert Orientation Level: Oriented X4 Attention: Selective Focused Attention: Appears intact Selective Attention: Appears intact Memory: Appears intact Immediate Memory Recall: Sock;Blue;Bed Memory Recall Sock: Without Cue Memory Recall Blue: Without Cue Memory Recall Bed: Without Cue Awareness: Impaired Awareness Impairment: Anticipatory impairment Safety/Judgment: Appears intact Sensation Sensation Light Touch: Impaired Detail Light Touch Impaired Details: Absent RLE;Absent LLE Proprioception: Impaired Detail Proprioception Impaired Details: Absent RLE;Absent LLE Stereognosis: Not tested Trunk/Postural Assessment  Cervical Assessment Cervical Assessment: Exceptions to WFL(cervical collar) Thoracic Assessment Thoracic Assessment: (back precautions) Lumbar Assessment Lumbar Assessment: (back precautions)  Balance Static Sitting Balance Static Sitting - Balance Support: Left upper extremity supported;Right upper extremity supported Static Sitting - Level of Assistance: 5: Stand by assistance;4: Min assist Dynamic Sitting Balance Dynamic Sitting - Balance Support: No upper extremity supported;Feet supported;During functional activity Dynamic Sitting - Level of Assistance: 3: Mod assist Extremity/Trunk Assessment RUE Assessment RUE Assessment: Within Functional Limits LUE Assessment LUE Assessment: Within Functional Limits   [name] 05/31/2019, 12:30 PM
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
## 2019-05-31 NOTE — Progress Notes (Addendum)
Social Work Patient ID: [name] Ford, male   DOB: 03-26-1985, 34 y.o.   MRN: [id number]   Per medical team, pt has decided to d/c to home tomorrow.   SW met with pt and pt s/o [name] Ford in room to discuss DME recommendations: hospital bed with air mattress, padded TTB with cutout, and HHPT/OT. Pt and s/o declined hospital bed as they state they have no where to put this item, and pt has a recliner that he will be able to use. SW informed padded TTB with cutout is not covered under charity, and will need to private purchase this item. SW informed will follow-up when able to secure [facility]. SW also reminded pt to trial catheters left in room (Speedicath 70f) to see if he likes them.   HHPT/OT charity care referral accepted by [facility]SN. [facility]   SW spoke with Dee/[place] [facility]([phone] for new patient appointment on Wednesday, April 21 @ 1pm with NKathe Becton NP. SW provided pt with new patient appointment card.   [facility]   *transfer board delivered. Pt provided 2 30ct [employer] supply. Pt encouraged to follow-up with vendor about supplies.   AuLoralee PacasMSW, LCNew Cumberlandffice: [phone]: [phone]: [phone]
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
## 2019-05-31 NOTE — Progress Notes (Signed)
Physical Therapy Session Note  Patient Details  Name: [name] Ford MRN: [id number] Date of Birth: 25-Apr-1985  Today's Date: 05/31/2019 PT Individual Time: 1130-1200 PT Individual Time Calculation (min): 30 min   Short Term Goals: Week 3:  PT Short Term Goal 1 (Week 3): Pt will perform or request assist w/pressure relief on schedule 2 min every 30 min PT Short Term Goal 2 (Week 3): Pt will perform bed mobility with assist x 1 consistently PT Short Term Goal 3 (Week 3): Pt will perform manual w/c propulsion x 150 ft at Supervision level  Skilled Therapeutic Interventions/Progress Updates:    Pt received seated in bed asleep, arousable and agreeable with encouragement to participate in therapy session. No complaints of pain this date. Reassessed BLE PROM, pt exhibits PROM [facility] for hips, knees, and ankles. Pt continues to exhibit no voluntary muscular activation or sensation below level of injury, consistent with diagnosis. Pt is dependent to don shoes at bed level, declines donning of leg lifters at this time. Pt is setup A for oral hygiene and washing his face while seated in bed. Pt dependent to don abdominal binder. Seated in bed to sitting EOB with mod A for BLE management. Pt tolerates sitting EOB x 5 min with intermittent UE support with close SBA. Pt declines to transfer to w/c in order to sit up for lunch. Seated EOB to supine with mod A for BLE management. Pt left semi-reclined in bed with needs in reach at end of session. Pt reports no further questions with regards to SCI edu.  Therapy Documentation Precautions:  Precautions Precautions: Fall, Cervical Precaution Booklet Issued: No Precaution Comments: Reveiwed precautions with patient Required Braces or Orthoses: Cervical Brace Cervical Brace: Hard collar, At all times Restrictions Weight Bearing Restrictions: No    Therapy/Group: Individual Therapy   Peter Congo, PT, DPT  05/31/2019, 12:49 PM

## 2019-05-31 NOTE — Progress Notes (Signed)
Orthopedic Tech Progress Note Patient Details:  [name] Ford [birth year] [id number] Called to [employer]TRA PADDING for [employer]. Patient ID: [name] Ford, male   DOB: [birth year], 34 y.o.   MRN: [id number]   [name] Ford 05/31/2019, 8:56 AM

## 2019-05-31 NOTE — Progress Notes (Signed)
Occupational Therapy Session Note  Patient Details  Name: [name] Ford MRN: [id number] Date of Birth: Feb 23, 1986  Today's Date: 05/31/2019 OT Individual Time: 3846-6599 OT Individual Time Calculation (min): 45 min  and Today's Date: 05/31/2019 OT Missed Time: 30 Minutes Missed Time Reason: Patient unwilling/refused to participate without medical reason   Short Term Goals: Week 3:  OT Short Term Goal 1 (Week 3): Pt will perform level transfer mat<>chair with max A +1 OT Short Term Goal 2 (Week 3): Pt will move BLE off EOB using leg loops with min A OT Short Term Goal 3 (Week 3): Pt will maintain sitting balance EOB with min A for UB dressing tasks  Skilled Therapeutic Interventions/Progress Updates:    Pt resting in bed upon arrival with fiancee present. Awaiting loaner w/c for use until custom w/c processed. Pt declined OOB until w/c arrives.  Note: w/c did not arrive during session. Again recommended hospital bed to facilitate improved bed mobility and decrease BOC for fiancee/caregiver.  Pt and fiancee not willing to remove furniture from room downstairs.  Pt's fiancee there is a recliner available in room. Deferred recommendation to [facility] regarding recliner. Reviewed importance of daily schedule and OOB schedule. Pt and fiancee feel confident in d/c for tomorrow (recommended d/c by therapy team originally set for 4/15). Pt remained in bed with all needs within reach and fiancee present.   Therapy Documentation Precautions:  Precautions Precautions: Fall, Cervical Precaution Booklet Issued: No Precaution Comments: Reveiwed precautions with patient Required Braces or Orthoses: Cervical Brace Cervical Brace: Hard collar, At all times Restrictions Weight Bearing Restrictions: No General: General OT Amount of Missed Time: 30 Minutes Vital Signs:   Pain:  Pt denies pain this afternoon   Therapy/Group: Individual Therapy  [name] 05/31/2019, 2:25 PM

## 2019-05-31 NOTE — Progress Notes (Signed)
Occupational Therapy Session Note  Patient Details  Name: [name] Ford MRN: [id number] Date of Birth: 1985/11/30  Today's Date: 05/31/2019 OT Individual Time: 0800-0900 OT Individual Time Calculation (min): 60 min    Short Term Goals: Week 3:  OT Short Term Goal 1 (Week 3): Pt will perform level transfer mat<>chair with max A +1 OT Short Term Goal 2 (Week 3): Pt will move BLE off EOB using leg loops with min A OT Short Term Goal 3 (Week 3): Pt will maintain sitting balance EOB with min A for UB dressing tasks  Skilled Therapeutic Interventions/Progress Updates:    Pt resting in bed upon arrival. Pt declined bathing this morning but stated that he needed to have his brief checked.  Mod A for rolling to R using bed rails. Brief was soaked with urine and small feces smear. Pt requested a bladder scan. Bladder scan results at 605 ml. Pt performed catherization with result of 550 ml. Ted Hose donned and pants donned with pt rolling in bed to facilitate. Pt insistent that he is going home 4/7. Recommended hospital bed but pt declined. Pt remained in bed with all needs within reach.   Therapy Documentation Precautions:  Precautions Precautions: Fall, Cervical Precaution Booklet Issued: No Precaution Comments: Reveiwed precautions with patient Required Braces or Orthoses: Cervical Brace Cervical Brace: Hard collar, At all times Restrictions Weight Bearing Restrictions: No Pain: Pt c/o discomfort posterior neck; repositioned with pillows  Therapy/Group: Individual Therapy  [name] 05/31/2019, 10:02 AM

## 2019-05-31 NOTE — Plan of Care (Signed)
  Problem: Consults Goal: RH SPINAL CORD INJURY PATIENT EDUCATION Description:  See Patient Education module for education specifics.  Outcome: Progressing Goal: Skin Care Protocol Initiated - if Braden Score 18 or less Description: If consults are not indicated, leave blank or document N/A Outcome: Progressing   Problem: SCI BOWEL ELIMINATION Goal: RH STG MANAGE BOWEL WITH ASSISTANCE Description: STG Manage Bowel with max Assistance. Outcome: Progressing Goal: RH STG SCI MANAGE BOWEL WITH MEDICATION WITH ASSISTANCE Description: STG SCI Manage bowel with medication with max assistance. Outcome: Progressing Goal: RH STG SCI MANAGE BOWEL PROGRAM W/ASSIST OR AS APPROPRIATE Description: STG SCI Manage bowel program w/ max assist or as appropriate. Outcome: Progressing   Problem: SCI BLADDER ELIMINATION Goal: RH STG MANAGE BLADDER WITH ASSISTANCE Description: STG Manage Bladder With max Assistance Outcome: Progressing   Problem: RH SKIN INTEGRITY Goal: RH STG SKIN FREE OF INFECTION/BREAKDOWN Description: Skin to remain free from breakdown with max assist while on rehab. Outcome: Progressing Goal: RH STG MAINTAIN SKIN INTEGRITY WITH ASSISTANCE Description: STG Maintain Skin Integrity With max Assistance. Outcome: Progressing Goal: RH STG ABLE TO PERFORM INCISION/WOUND CARE W/ASSISTANCE Description: STG Able To Perform Incision/Wound Care With max Assistance. Outcome: Progressing   Problem: RH SAFETY Goal: RH STG ADHERE TO SAFETY PRECAUTIONS W/ASSISTANCE/DEVICE Description: STG Adhere to Safety Precautions With max Assistance appropriate assistive Device. Outcome: Progressing   Problem: RH PAIN MANAGEMENT Goal: RH STG PAIN MANAGED AT OR BELOW PT'S PAIN GOAL Description: <4 on a 0-10 pain scale. Outcome: Progressing   Problem: RH KNOWLEDGE DEFICIT SCI Goal: RH STG INCREASE KNOWLEDGE OF SELF CARE AFTER SCI Description: Patient and caregiver will demonstrate knowledge of  medication management, bowel and bladder management, skin care management, and follow up care with the MD post discharge with min assist from staff. Outcome: Progressing   

## 2019-05-31 NOTE — Progress Notes (Addendum)
This Clinical research associate and Psychologist, sport and exercise with in to complete I/O cath with pt. Pt still concerned about white discharge that his coming from penis. Pt has a very small amount of white clear thin substance coming out of penis. Pt states that the doctor said it was backed up sperm. Pt concerned still about the color of urine and the odor. Pt throwing out many different scenarios. This writer explained to  Pt that it could be multiple things and could be nothing and that waiting to get labs or any cultures back would be the best option.   Urine was Amber and had scant amount of sediment

## 2019-05-31 NOTE — Progress Notes (Signed)
Physical Therapy Discharge Summary  Patient Details  Name: [name] Ford MRN: [id number] Date of Birth: Aug 08, 1985  Today's Date: 05/31/2019   Patient has met 5 of 7 long term goals due to improved activity tolerance, improved balance, improved postural control and ability to compensate for deficits.  Patient to discharge at a wheelchair level [place].   Patient's care partner is independent to provide the necessary physical assistance at discharge. Pt's fiance has completed hands on family education and is safe to assist pt upon d/c home.  Reasons goals not met: Pt did not meet all of his rehab goals due to a shortened length of stay. Pt exhibited decreased buy-in of therapy and rehab program and exhibited limited participation and insight during his stay. Pt was unable to achieve all of his rehab goals due to requesting d/c home prior to completing therapy program. Anticipated that pt would be at CGA to min A level overall. He currently requires mod A overall for transfers but his fiance is able to provide this level of assist.  Recommendation:  Patient will benefit from ongoing skilled PT services in home health setting to continue to advance safe functional mobility, address ongoing impairments in endurance, strength, balance, safety, independence with functional mobility, and minimize fall risk.  Equipment: loaner ulra light manual wheelchair; 30" slide board   Therapy recommends use of a hospital bed at home for increased safety with transfers due to height adjustability, for caregiver body mechanics when assisting pt with bathing/dressing and bowel/bladder program, and for increased patient independence with bathing and dressing at bed level. Pt and his fiance decline use of a hospital bed at this time despite recommendations.  Reasons for discharge: discharge from hospital  Patient/family agrees with progress made and goals achieved: Yes  PT  Discharge Precautions/Restrictions Precautions Precautions: Fall;Cervical Precaution Comments: Reveiwed precautions with patient Required Braces or Orthoses: Cervical Brace Cervical Brace: Hard collar;At all times Restrictions Weight Bearing Restrictions: Yes LUE Weight Bearing: Weight bearing as tolerated Vision/Perception  Perception Perception: Within Functional Limits Praxis Praxis: Intact  Cognition Overall Cognitive Status: Within Functional Limits for tasks assessed Arousal/Alertness: Awake/alert Orientation Level: Oriented X4 Attention: Selective Focused Attention: Appears intact Selective Attention: Appears intact Memory: Appears intact Awareness: Impaired Awareness Impairment: Anticipatory impairment Problem Solving: Impaired Safety/Judgment: Impaired Sensation Sensation Light Touch: Impaired Detail Central sensation comments: impaired T4 and below Light Touch Impaired Details: Absent RLE;Absent LLE Proprioception: Impaired Detail Proprioception Impaired Details: Absent RLE;Absent LLE Coordination Gross Motor Movements are Fluid and Coordinated: No Fine Motor Movements are Fluid and Coordinated: Yes Coordination and Movement Description: impaired 2/2 T4 paraplegia Motor  Motor Motor: Abnormal tone;Abnormal postural alignment and control;Paraplegia Motor - Skilled Clinical Observations: T4 paraplegia Motor - Discharge Observations: T4 paraplegia  Mobility Bed Mobility Bed Mobility: Rolling Right;Rolling Left;Supine to Sit;Sit to Supine Rolling Right: Minimal Assistance - Patient > 75% Rolling Left: Minimal Assistance - Patient > 75% Supine to Sit: Moderate Assistance - Patient 50-74% Sit to Supine: Moderate Assistance - Patient 50-74% Transfers Transfers: Lateral/Scoot Transfers Lateral/Scoot Transfers: Moderate Assistance - Patient 50-74% Transfer (Assistive device): Other (Comment)(slide board) Locomotion  Gait Ambulation: No Gait Gait: No Stairs  / Additional Locomotion Stairs: No Architect: Yes Wheelchair Assistance: Independent with Camera operator: Both upper extremities Wheelchair Parts Management: Needs assistance Distance: 150  Trunk/Postural Assessment  Cervical Assessment Cervical Assessment: Exceptions to WFL(cervical collar; spinal precautions) Thoracic Assessment Thoracic Assessment: Exceptions to WFL(back precautions) Lumbar Assessment Lumbar Assessment: Exceptions to WFL(posterior pelvic  tilt) Postural Control Postural Control: Deficits on evaluation(insufficient and delayed trunk control)  Balance Balance Balance Assessed: Yes Static Sitting Balance Static Sitting - Balance Support: Bilateral upper extremity supported;Feet supported Static Sitting - Level of Assistance: 5: Stand by assistance;4: Min assist Dynamic Sitting Balance Dynamic Sitting - Balance Support: No upper extremity supported;Feet supported;During functional activity Dynamic Sitting - Level of Assistance: 3: Mod assist Extremity Assessment      RLE Assessment RLE Assessment: Exceptions to [facility] Passive Range of Motion (PROM) Comments: decreased DF ROM General Strength Comments: 0/5 LLE Assessment LLE Assessment: Exceptions to [facility] Passive Range of Motion (PROM) Comments: decreased DF ROM General Strength Comments: 0/5     Excell Seltzer, PT, DPT 05/31/2019, 4:33 PM

## 2019-05-31 NOTE — Progress Notes (Signed)
Dig stim followed by suppository.  Pt had had a very small, loose bm prior to dig stim and suppository.  Mepilex on sacrum replaced during dig stim as well as it had become soiled.

## 2019-05-31 NOTE — Progress Notes (Signed)
Physical Therapy Session Note  Patient Details  Name: [name] Ford MRN: [id number] Date of Birth: 1985/08/20  Today's Date: 05/31/2019 PT Individual Time: 1450-1535 PT Individual Time Calculation (min): 45 min   Short Term Goals: Week 3:  PT Short Term Goal 1 (Week 3): Pt will perform or request assist w/pressure relief on schedule 2 min every 30 min PT Short Term Goal 2 (Week 3): Pt will perform bed mobility with assist x 1 consistently PT Short Term Goal 3 (Week 3): Pt will perform manual w/c propulsion x 150 ft at Supervision level  Skilled Therapeutic Interventions/Progress Updates: Pt presented in bed with fiancee and [name] from [place] present and agreeable to therapy. Fiancee assisted with bed mobility and set up of w/c for transfer. Pt and fiancee performed SB transfer downhill to ultralight w/c. [name] made adjustments as needed and indicated changing back support to include slightly increased lateral support would be recommended. It was also recommended to use seat belt initially due to decreased core stability. Pt transported to parking lot to perform car transfer with fiancee's vehicle. Pt performed SB transfer to passenger seat with minA from fiancee and PTA providing verbal cues for safety. Pt was able to perform transfer out of car in same manner. As pt was returning to hospital entrance pt was adjusting locs and had lateral LOB in w/c. PTA used this as opportunity to reinforce benefit of using seat belt although pt was not overly receptive to idea. Pt was able to propel periodically at hospital entrance, down hallway and controlling speed on down slope in 4N hallway with overall supervision. Pt returned to room and pt and fiancee performed SB transfer uphill to bed. During transfer pt was unable to slide fully onto bed and shifted anteriorly. PTA and fiancee blocking feet and primary PT able to assist pulling pt's hips more centered on bed to complete safe transfer. [name]  completed transfer to supine and pt left with wife repositioning to comfort with current needs met.      Therapy Documentation Precautions:  Precautions Precautions: (P) Fall, Cervical Precaution Booklet Issued: No Precaution Comments: (P) Reveiwed precautions with patient Required Braces or Orthoses: (P) Cervical Brace Cervical Brace: (P) Hard collar, At all times Restrictions Weight Bearing Restrictions: (P) Yes General:   Vital Signs: Therapy Vitals Temp: (!) 97.4 F (36.3 C) Temp Source: Oral Pulse Rate: 66 Resp: 20 BP: 138/77 Patient Position (if appropriate): Lying Oxygen Therapy SpO2: 100 % O2 Device: Room Air Pain:   Mobility:   Locomotion :    Trunk/Postural Assessment : Cervical Assessment Cervical Assessment: Exceptions to WFL(cervical collar) Thoracic Assessment Thoracic Assessment: (back precautions) Lumbar Assessment Lumbar Assessment: (back precautions)  Balance: Static Sitting Balance Static Sitting - Balance Support: Left upper extremity supported;Right upper extremity supported Static Sitting - Level of Assistance: 5: Stand by assistance;4: Min assist Dynamic Sitting Balance Dynamic Sitting - Balance Support: No upper extremity supported;Feet supported;During functional activity Dynamic Sitting - Level of Assistance: 3: Mod assist Exercises:   Other Treatments:      Therapy/Group: Individual Therapy  Cristella Stiver 05/31/2019, 4:07 PM

## 2019-06-01 LAB — URINE CULTURE: Culture: NO GROWTH

## 2019-06-01 MED ORDER — CEPHALEXIN 500 MG PO CAPS: 500.0000 mg | ORAL_CAPSULE | Freq: Three times a day (TID) | ORAL | 0 refills | Status: DC

## 2019-06-01 MED ORDER — ADULT MULTIVITAMIN W/MINERALS CH: 1.0000 | ORAL_TABLET | Freq: Every day | ORAL | 0 refills | Status: AC

## 2019-06-01 MED ORDER — ENOXAPARIN SODIUM 40 MG/0.4ML ~~LOC~~ SOLN: 40.0000 mg | SUBCUTANEOUS | 0 refills | Status: DC

## 2019-06-01 MED ORDER — BISACODYL 10 MG RE SUPP: 10.0000 mg | Freq: Every day | RECTAL | 0 refills | Status: DC

## 2019-06-01 MED ORDER — FLUDROCORTISONE ACETATE 0.1 MG PO TABS: 0.1000 mg | ORAL_TABLET | Freq: Every day | ORAL | 0 refills | Status: DC

## 2019-06-01 MED ORDER — BACLOFEN 5 MG PO TABS: 5.0000 mg | ORAL_TABLET | Freq: Three times a day (TID) | ORAL | 0 refills | Status: DC | PRN

## 2019-06-01 MED ORDER — MIDODRINE HCL 5 MG PO TABS: 5.0000 mg | ORAL_TABLET | Freq: Three times a day (TID) | ORAL | 0 refills | Status: DC

## 2019-06-01 MED ORDER — POLYETHYLENE GLYCOL 3350 17 G PO PACK: 17.0000 g | PACK | Freq: Every day | ORAL | 0 refills | Status: DC

## 2019-06-01 MED ORDER — OXYCODONE-ACETAMINOPHEN 7.5-325 MG PO TABS: 1.0000 | ORAL_TABLET | Freq: Four times a day (QID) | ORAL | 0 refills | Status: DC | PRN

## 2019-06-01 MED ORDER — SENNA 8.6 MG PO TABS: 2.0000 | ORAL_TABLET | Freq: Two times a day (BID) | ORAL | 0 refills | Status: DC

## 2019-06-01 MED FILL — MIDODRINE HCL 5 MG TABS: 5 | 30 days supply | Qty: 90 | Fill #0

## 2019-06-01 MED FILL — ENOXAPARIN SODIUM 40 MG/0.4: 40 | 30 days supply | Qty: 12 | Fill #0

## 2019-06-01 MED FILL — POLYETHYLENE GLYCOL 3350 PO: 17 | 30 days supply | Qty: 510 | Fill #0

## 2019-06-01 MED FILL — FLUDROCORTISONE 0.1 MG TAB: 0.1 | 30 days supply | Qty: 30 | Fill #0

## 2019-06-01 MED FILL — BISACODYL 10 MG SUPP: 10 | 27 days supply | Qty: 27 | Fill #0

## 2019-06-01 MED FILL — SENNA 8.6 MG TABS: 8.6 | 30 days supply | Qty: 120 | Fill #0

## 2019-06-01 MED FILL — CEPHALEXIN 500 MG CAPS: 500 | 6 days supply | Qty: 18 | Fill #0

## 2019-06-01 MED FILL — OXYCODONE-APAP 7.5-325MG: 7.5-325 | 7 days supply | Qty: 28 | Fill #0

## 2019-06-01 MED FILL — BACLOFEN 10 MG TABS: 10 | 30 days supply | Qty: 45 | Fill #0
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
## 2019-06-01 NOTE — Discharge Instructions (Signed)
Inpatient Rehab Discharge Instructions  Gable Odonohue Discharge date and time:  06/01/19   Activities/Precautions/ Functional Status: Activity: no lifting, driving, or strenuous exercise till cleared by MD Diet: regular diet Wound Care: Wear collar at all times.    Functional status:  ___ No restrictions     ___ Walk up steps independently _X__ 24/7 supervision/assistance   ___ Walk up steps with assistance ___ Intermittent supervision/assistance  ___ Bathe/dress independently ___ Walk with walker     _X__ Bathe/dress with assistance ___ Walk Independently    ___ Shower independently ___ Walk with assistance    ___ Shower with assistance _X__ No alcohol     ___ Return to work/school ________   COMMUNITY REFERRALS UPON DISCHARGE:    Home Health:   PT    OT     RN                      Agency: [facility] Phone:    Medical Equipment/Items Ordered:transfer board                                                 Agency/Supplier: [employer] [phone]  Catheters- Please contact [employer] #[phone] to discuss catheter supplies needed.   GENERAL COMMUNITY RESOURCES FOR PATIENT/FAMILY: New patient appointment: Wednesday, June 15, 2019 at 1pm with [name], NP [facility]  [address] [place], [place] [address]  Special Instructions: 1. Need to keep a record of how much you drink Note that what you drink will affect how much urine output you have. Stop drinking after 7 pm--suck on ice chips. Cath yourself every 4 hours with goal to keep volumes less than 250 cc. Make sure to cath before going to bed and set alarm to cath early am around 4-5 am as you will have higher volumes during the night.    My questions have been answered and I understand these instructions. I will adhere to these goals and the provided educational materials after my discharge from the hospital.  Patient/Caregiver Signature _______________________________  Date __________  Clinician Signature _______________________________________ Date __________  Please bring this form and your medication list with you to all your follow-up doctor's appointments.
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
## 2019-06-01 NOTE — Progress Notes (Signed)
Patient discharged home with family, all belongings sent with patient. Girlfriend with patient. No complications noted at this time.  Jay Schlichter, LPN
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
## 2019-06-02 NOTE — Progress Notes (Signed)
Social Work Discharge Note   The overall goal for the admission was met for:   Discharge location: Yes. D/c to home with his s/o Brandi.   Length of Stay: Yes. 21 days.   Discharge activity level: Yes. Minimal Assistance; Max/total A for LB dressing  Home/community participation: Yes. Limited  Services provided included: MD, RD, PT, OT, RN, CM, TR, Pharmacy, Neuropsych and SW  Financial Services: Other: Uninsured; Medicaid pending  Follow-up services arranged: Home Health: [facility] for PT/OT/SN and DME: [employer] [facility]- transfer board; loaner w/c provided by [facility]/c arranged by PT; pt provided with 2-30ct SimPro 6f catheters. Contact information for catheter follow-up in d/c instructions. [facility]   Comments (or additional information): contact pt #[phone] s/o BVelna Hatchet#[phone] Patient/Family verbalized understanding of follow-up arrangements: Yes  Individual responsible for coordination of the follow-up plan: Pt will have assistance with managing his care needs from family.   Confirmed correct DME delivered: ARana Snare4/09/2019    ARana Snare

## 2019-06-03 ENCOUNTER — Telehealth: Payer: Self-pay | Admitting: *Deleted

## 2019-06-03 NOTE — Telephone Encounter (Signed)
Mr Feighner called to speak with Dr Berline Chough and asks that she call back.  [phone]

## 2019-06-03 NOTE — Telephone Encounter (Signed)
L/M for patient- called 2x to make sure didn't miss him accidentally.  So L/M at that time.

## 2019-06-04 ENCOUNTER — Inpatient Hospital Stay (HOSPITAL_COMMUNITY): Payer: Self-pay | Admitting: Occupational Therapy

## 2019-06-04 ENCOUNTER — Inpatient Hospital Stay (HOSPITAL_COMMUNITY): Payer: Self-pay | Admitting: Physical Therapy

## 2019-06-06 ENCOUNTER — Other Ambulatory Visit: Payer: Self-pay

## 2019-06-06 ENCOUNTER — Encounter
Payer: Medicaid Other | Attending: Physical Medicine and Rehabilitation | Admitting: Physical Medicine and Rehabilitation

## 2019-06-06 ENCOUNTER — Encounter: Payer: Self-pay | Admitting: Physical Medicine and Rehabilitation

## 2019-06-06 VITALS — BP 106/67 | HR 95 | Temp 98.9°F | Ht 72.0 in | Wt 239.6 lb

## 2019-06-06 DIAGNOSIS — K592 Neurogenic bowel, not elsewhere classified: Secondary | ICD-10-CM | POA: Insufficient documentation

## 2019-06-06 DIAGNOSIS — G894 Chronic pain syndrome: Secondary | ICD-10-CM | POA: Diagnosis present

## 2019-06-06 DIAGNOSIS — N319 Neuromuscular dysfunction of bladder, unspecified: Secondary | ICD-10-CM | POA: Diagnosis not present

## 2019-06-06 DIAGNOSIS — G8220 Paraplegia, unspecified: Secondary | ICD-10-CM | POA: Insufficient documentation

## 2019-06-06 MED ORDER — OXYCODONE-ACETAMINOPHEN 7.5-325 MG PO TABS: 1.0000 | ORAL_TABLET | Freq: Four times a day (QID) | ORAL | 0 refills | Status: DC | PRN
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
## 2019-06-06 NOTE — Patient Instructions (Signed)
Patient is a 34 yr old male with hx of T3/4 paraplegia Neurogenic bowel and bladder, and mild spasticity  1. Perocet 7.5 mg q4 hours prn  2. Opiate contract and oral drug screen.   3. Will do trigger point injections at next visit.   4. Theracane - suggest-   5. Wait and don't get rid of Baclofen  6. F/U in 2 months overall for SCI

## 2019-06-06 NOTE — Addendum Note (Signed)
Addended by: Doreene Eland on: 06/06/2019 04:00 PM   Modules accepted: Orders

## 2019-06-07 ENCOUNTER — Telehealth: Payer: Self-pay

## 2019-06-07 MED ORDER — OXYCODONE-ACETAMINOPHEN 7.5-325 MG PO TABS: 1.0000 | ORAL_TABLET | Freq: Four times a day (QID) | ORAL | 0 refills | Status: DC | PRN

## 2019-06-07 NOTE — Telephone Encounter (Signed)
Sent in Rx to new pharmacy- thank you

## 2019-06-07 NOTE — Telephone Encounter (Signed)
Patient called [name] Ford has exceeded there amount of Oxycodone/APAP and will not be getting anymore in. Ask to send to [facility] in [place] on [address]. I called [facility] and cancelled prescription.
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
## 2019-06-10 LAB — DRUG TOX MONITOR 1 W/CONF, ORAL FLD
Amphetamines: NEGATIVE ng/mL (ref <10)
Barbiturates: NEGATIVE ng/mL (ref <10)
Benzodiazepines: NEGATIVE ng/mL (ref <0.50)
Buprenorphine: NEGATIVE ng/mL (ref <0.10)
Cocaine: NEGATIVE ng/mL (ref <5.0)
Codeine: NEGATIVE ng/mL (ref <2.5)
Cotinine: 6.8 ng/mL — ABNORMAL HIGH (ref <5.0)
Dihydrocodeine: NEGATIVE ng/mL (ref <2.5)
Fentanyl: NEGATIVE ng/mL (ref <0.10)
Heroin Metabolite: NEGATIVE ng/mL (ref <1.0)
Hydrocodone: NEGATIVE ng/mL (ref <2.5)
Hydromorphone: NEGATIVE ng/mL (ref <2.5)
MARIJUANA: NEGATIVE ng/mL (ref <2.5)
MDMA: NEGATIVE ng/mL (ref <10)
Meprobamate: NEGATIVE ng/mL (ref <2.5)
Methadone: NEGATIVE ng/mL (ref <5.0)
Morphine: NEGATIVE ng/mL (ref <2.5)
Nicotine Metabolite: POSITIVE ng/mL — AB (ref <5.0)
Norhydrocodone: NEGATIVE ng/mL (ref <2.5)
Noroxycodone: 22.1 ng/mL — ABNORMAL HIGH (ref <2.5)
Opiates: POSITIVE ng/mL — AB (ref <2.5)
Oxycodone: 54.1 ng/mL — ABNORMAL HIGH (ref <2.5)
Oxymorphone: NEGATIVE ng/mL (ref <2.5)
Phencyclidine: NEGATIVE ng/mL (ref <10)
Tapentadol: NEGATIVE ng/mL (ref <5.0)
Tramadol: NEGATIVE ng/mL (ref <5.0)
Zolpidem: NEGATIVE ng/mL (ref <5.0)

## 2019-06-10 LAB — DRUG TOX ALC METAB W/CON, ORAL FLD: Alcohol Metabolite: NEGATIVE ng/mL (ref <25)

## 2019-06-13 ENCOUNTER — Telehealth: Payer: Self-pay | Admitting: *Deleted

## 2019-06-13 NOTE — Telephone Encounter (Signed)
Oral swab drug screen was consistent for prescribed medications.  ?

## 2019-06-15 ENCOUNTER — Ambulatory Visit: Payer: Self-pay | Admitting: Family Medicine

## 2019-06-17 DIAGNOSIS — Z9181 History of falling: Secondary | ICD-10-CM

## 2019-06-17 DIAGNOSIS — S0231XD Fracture of orbital floor, right side, subsequent encounter for fracture with routine healing: Secondary | ICD-10-CM

## 2019-06-17 DIAGNOSIS — S24112D Complete lesion at T2-T6 level of thoracic spinal cord, subsequent encounter: Secondary | ICD-10-CM

## 2019-06-17 DIAGNOSIS — M419 Scoliosis, unspecified: Secondary | ICD-10-CM

## 2019-06-17 DIAGNOSIS — Z7901 Long term (current) use of anticoagulants: Secondary | ICD-10-CM

## 2019-06-17 DIAGNOSIS — S22039D Unspecified fracture of third thoracic vertebra, subsequent encounter for fracture with routine healing: Secondary | ICD-10-CM

## 2019-06-17 DIAGNOSIS — K592 Neurogenic bowel, not elsewhere classified: Secondary | ICD-10-CM

## 2019-06-17 DIAGNOSIS — K5900 Constipation, unspecified: Secondary | ICD-10-CM

## 2019-06-17 DIAGNOSIS — S22059D Unspecified fracture of T5-T6 vertebra, subsequent encounter for fracture with routine healing: Secondary | ICD-10-CM

## 2019-06-17 DIAGNOSIS — Z792 Long term (current) use of antibiotics: Secondary | ICD-10-CM

## 2019-06-17 DIAGNOSIS — F17210 Nicotine dependence, cigarettes, uncomplicated: Secondary | ICD-10-CM

## 2019-06-17 DIAGNOSIS — Z7952 Long term (current) use of systemic steroids: Secondary | ICD-10-CM

## 2019-06-17 DIAGNOSIS — S12600D Unspecified displaced fracture of seventh cervical vertebra, subsequent encounter for fracture with routine healing: Secondary | ICD-10-CM

## 2019-06-17 DIAGNOSIS — Z981 Arthrodesis status: Secondary | ICD-10-CM

## 2019-06-17 DIAGNOSIS — S02113D Unspecified occipital condyle fracture, subsequent encounter for fracture with routine healing: Secondary | ICD-10-CM

## 2019-06-17 DIAGNOSIS — S062X0D Diffuse traumatic brain injury without loss of consciousness, subsequent encounter: Secondary | ICD-10-CM

## 2019-06-17 DIAGNOSIS — Z79891 Long term (current) use of opiate analgesic: Secondary | ICD-10-CM

## 2019-06-17 DIAGNOSIS — N319 Neuromuscular dysfunction of bladder, unspecified: Secondary | ICD-10-CM

## 2019-06-17 DIAGNOSIS — G4700 Insomnia, unspecified: Secondary | ICD-10-CM

## 2019-06-17 DIAGNOSIS — S12090D Other displaced fracture of first cervical vertebra, subsequent encounter for fracture with routine healing: Secondary | ICD-10-CM

## 2019-06-17 DIAGNOSIS — G8221 Paraplegia, complete: Secondary | ICD-10-CM

## 2019-06-17 DIAGNOSIS — S22049D Unspecified fracture of fourth thoracic vertebra, subsequent encounter for fracture with routine healing: Secondary | ICD-10-CM

## 2019-06-17 MED ORDER — NYSTATIN 100000 UNIT/GM EX POWD: 1.0000 "application " | Freq: Three times a day (TID) | CUTANEOUS | 3 refills | Status: DC

## 2019-06-17 NOTE — Telephone Encounter (Signed)
Sent some nystatin powder in for pt- not sure exactly what Merry Proud wants since called and L/M but didn't get her on phone- if not better by Monday, can add Diflucan for wound.

## 2019-06-22 ENCOUNTER — Ambulatory Visit (INDEPENDENT_AMBULATORY_CARE_PROVIDER_SITE_OTHER): Payer: Self-pay | Admitting: Family Medicine

## 2019-06-22 ENCOUNTER — Encounter: Payer: Self-pay | Admitting: Family Medicine

## 2019-06-22 ENCOUNTER — Other Ambulatory Visit: Payer: Self-pay

## 2019-06-22 VITALS — BP 116/74 | HR 92 | Temp 98.2°F | Ht 72.0 in

## 2019-06-22 DIAGNOSIS — N319 Neuromuscular dysfunction of bladder, unspecified: Secondary | ICD-10-CM

## 2019-06-22 DIAGNOSIS — S31000S Unspecified open wound of lower back and pelvis without penetration into retroperitoneum, sequela: Secondary | ICD-10-CM

## 2019-06-22 DIAGNOSIS — Z742 Need for assistance at home and no other household member able to render care: Secondary | ICD-10-CM

## 2019-06-22 DIAGNOSIS — Z7689 Persons encountering health services in other specified circumstances: Secondary | ICD-10-CM

## 2019-06-22 DIAGNOSIS — Z0000 Encounter for general adult medical examination without abnormal findings: Secondary | ICD-10-CM

## 2019-06-22 DIAGNOSIS — T1490XA Injury, unspecified, initial encounter: Secondary | ICD-10-CM

## 2019-06-22 DIAGNOSIS — Z09 Encounter for follow-up examination after completed treatment for conditions other than malignant neoplasm: Secondary | ICD-10-CM

## 2019-06-22 DIAGNOSIS — G8220 Paraplegia, unspecified: Secondary | ICD-10-CM

## 2019-06-22 DIAGNOSIS — M542 Cervicalgia: Secondary | ICD-10-CM

## 2019-06-22 LAB — POCT GLYCOSYLATED HEMOGLOBIN (HGB A1C): Hemoglobin A1C: 5.1 % (ref 4.0–5.6)

## 2019-06-22 LAB — GLUCOSE, POCT (MANUAL RESULT ENTRY): POC Glucose: 95 mg/dl (ref 70–99)
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
## 2019-06-23 ENCOUNTER — Other Ambulatory Visit: Payer: Self-pay | Admitting: Family Medicine

## 2019-06-23 ENCOUNTER — Encounter: Payer: Self-pay | Admitting: Family Medicine

## 2019-06-23 DIAGNOSIS — Z742 Need for assistance at home and no other household member able to render care: Secondary | ICD-10-CM | POA: Insufficient documentation

## 2019-06-23 DIAGNOSIS — M542 Cervicalgia: Secondary | ICD-10-CM | POA: Insufficient documentation

## 2019-06-23 DIAGNOSIS — E559 Vitamin D deficiency, unspecified: Secondary | ICD-10-CM

## 2019-06-23 DIAGNOSIS — S31000S Unspecified open wound of lower back and pelvis without penetration into retroperitoneum, sequela: Secondary | ICD-10-CM | POA: Insufficient documentation

## 2019-06-23 LAB — COMPREHENSIVE METABOLIC PANEL
ALT: 38 IU/L (ref 0–44)
AST: 26 IU/L (ref 0–40)
Albumin/Globulin Ratio: 1 — ABNORMAL LOW (ref 1.2–2.2)
Albumin: 3.8 g/dL — ABNORMAL LOW (ref 4.0–5.0)
Alkaline Phosphatase: 92 IU/L (ref 39–117)
BUN/Creatinine Ratio: 14 (ref 9–20)
BUN: 12 mg/dL (ref 6–20)
Bilirubin Total: 0.4 mg/dL (ref 0.0–1.2)
CO2: 24 mmol/L (ref 20–29)
Calcium: 9.7 mg/dL (ref 8.7–10.2)
Chloride: 95 mmol/L — ABNORMAL LOW (ref 96–106)
Creatinine, Ser: 0.86 mg/dL (ref 0.76–1.27)
GFR calc Af Amer: 132 mL/min/{1.73_m2} (ref >59)
GFR calc non Af Amer: 114 mL/min/{1.73_m2} (ref >59)
Globulin, Total: 3.9 g/dL (ref 1.5–4.5)
Glucose: 87 mg/dL (ref 65–99)
Potassium: 4.3 mmol/L (ref 3.5–5.2)
Sodium: 136 mmol/L (ref 134–144)
Total Protein: 7.7 g/dL (ref 6.0–8.5)

## 2019-06-23 LAB — CBC WITH DIFFERENTIAL/PLATELET
Basophils Absolute: 0 10*3/uL (ref 0.0–0.2)
Basos: 0 %
EOS (ABSOLUTE): 0.3 10*3/uL (ref 0.0–0.4)
Eos: 3 %
Hematocrit: 38.5 % (ref 37.5–51.0)
Hemoglobin: 12.4 g/dL — ABNORMAL LOW (ref 13.0–17.7)
Immature Grans (Abs): 0.1 10*3/uL (ref 0.0–0.1)
Immature Granulocytes: 1 %
Lymphocytes Absolute: 1.9 10*3/uL (ref 0.7–3.1)
Lymphs: 18 %
MCH: 28.6 pg (ref 26.6–33.0)
MCHC: 32.2 g/dL (ref 31.5–35.7)
MCV: 89 fL (ref 79–97)
Monocytes Absolute: 0.7 10*3/uL (ref 0.1–0.9)
Monocytes: 7 %
Neutrophils Absolute: 7.8 10*3/uL — ABNORMAL HIGH (ref 1.4–7.0)
Neutrophils: 71 %
Platelets: 324 10*3/uL (ref 150–450)
RBC: 4.34 x10E6/uL (ref 4.14–5.80)
RDW: 12.9 % (ref 11.6–15.4)
WBC: 10.8 10*3/uL (ref 3.4–10.8)

## 2019-06-23 LAB — VITAMIN D 25 HYDROXY (VIT D DEFICIENCY, FRACTURES): Vit D, 25-Hydroxy: 17.3 ng/mL — ABNORMAL LOW (ref 30.0–100.0)

## 2019-06-23 LAB — VITAMIN B12: Vitamin B-12: 670 pg/mL (ref 232–1245)

## 2019-06-23 LAB — MAGNESIUM: Magnesium: 2.1 mg/dL (ref 1.6–2.3)

## 2019-06-23 LAB — TSH: TSH: 1.19 u[IU]/mL (ref 0.450–4.500)

## 2019-06-23 LAB — PHOSPHORUS: Phosphorus: 4.2 mg/dL — ABNORMAL HIGH (ref 2.8–4.1)

## 2019-06-23 MED ORDER — VITAMIN D (ERGOCALCIFEROL) 1.25 MG (50000 UNIT) PO CAPS: 50000.0000 [IU] | ORAL_CAPSULE | ORAL | 6 refills | Status: DC

## 2019-06-24 ENCOUNTER — Telehealth: Payer: Self-pay

## 2019-06-24 ENCOUNTER — Telehealth: Payer: Self-pay | Admitting: Physical Medicine and Rehabilitation

## 2019-06-24 MED ORDER — CEPHALEXIN 250 MG PO CAPS: 250.0000 mg | ORAL_CAPSULE | Freq: Three times a day (TID) | ORAL | 0 refills | Status: AC
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
## 2019-06-24 NOTE — Telephone Encounter (Signed)
Patient aware of labs results.   Also we are still working on his referrals.

## 2019-06-24 NOTE — Telephone Encounter (Signed)
Message left for call back for lab review.

## 2019-06-24 NOTE — Telephone Encounter (Signed)
-----   Message from Kallie Locks, FNP sent at 06/23/2019 10:07 PM EDT ----- Vitamin D level is extremely low. Rx for Vitamin D supplement sent to pharmacy today. He should include foods that are high in Vitamin D. These include: Salmon, Cod Liver Oil, Mushrooms, Canned Fish, Milk, and Egg Yolks.   All other labs are stable. Keep follow up appointment. Please inform patient.

## 2019-06-27 ENCOUNTER — Telehealth: Payer: Self-pay

## 2019-06-27 NOTE — Telephone Encounter (Signed)
I have reached out to [facility] for Healthcare maintenance. The nurse care manage or office manager will be calling back. ( We will need to plea his case).   I also called out to [facility] for DME equipment (bed, trapeze bar, wheelchair).    [name] Ford does not have insurance which is preventing him from getting these services. No one knows of any place for assistance.

## 2019-06-28 ENCOUNTER — Other Ambulatory Visit: Payer: Self-pay

## 2019-06-28 MED ORDER — SENNA 8.6 MG PO TABS: 2.0000 | ORAL_TABLET | Freq: Two times a day (BID) | ORAL | 11 refills | Status: DC

## 2019-06-28 MED ORDER — FLUDROCORTISONE ACETATE 0.1 MG PO TABS: 0.1000 mg | ORAL_TABLET | Freq: Every day | ORAL | 3 refills | Status: DC

## 2019-06-28 MED ORDER — MIDODRINE HCL 5 MG PO TABS: 5.0000 mg | ORAL_TABLET | Freq: Three times a day (TID) | ORAL | 3 refills | Status: DC

## 2019-06-28 MED ORDER — BISACODYL 10 MG RE SUPP: 10.0000 mg | Freq: Every day | RECTAL | 11 refills | Status: DC

## 2019-06-28 MED ORDER — ENOXAPARIN SODIUM 40 MG/0.4ML ~~LOC~~ SOLN: 40.0000 mg | SUBCUTANEOUS | 1 refills | Status: DC

## 2019-06-29 ENCOUNTER — Telehealth: Payer: Self-pay

## 2019-06-29 NOTE — Telephone Encounter (Signed)
Call back to [facility] with [name] Ford [facility] [phone]). She is out of the office. I will try back after lunch.

## 2019-06-29 NOTE — Telephone Encounter (Signed)
I spoke with [name] Ford with [facility] about extending Mr. [place] home healthcare. They can not extend it because he is considered, [facility].  If DME equipment is still needed he will need insurance.

## 2019-06-29 NOTE — Telephone Encounter (Signed)
[name] Ford has been advised to call back as soon as his  insurance becomes active.

## 2019-06-30 ENCOUNTER — Other Ambulatory Visit: Payer: Self-pay | Admitting: *Deleted

## 2019-06-30 MED ORDER — ENOXAPARIN SODIUM 40 MG/0.4ML ~~LOC~~ SOLN: 40.0000 mg | SUBCUTANEOUS | 1 refills | Status: DC

## 2019-06-30 MED ORDER — BISACODYL 10 MG RE SUPP: 10.0000 mg | Freq: Every day | RECTAL | 11 refills | Status: AC

## 2019-06-30 MED ORDER — MIDODRINE HCL 5 MG PO TABS: 5.0000 mg | ORAL_TABLET | Freq: Three times a day (TID) | ORAL | 3 refills | Status: DC

## 2019-06-30 MED ORDER — SENNA 8.6 MG PO TABS: 2.0000 | ORAL_TABLET | Freq: Two times a day (BID) | ORAL | 11 refills | Status: AC

## 2019-06-30 MED ORDER — FLUDROCORTISONE ACETATE 0.1 MG PO TABS: 0.1000 mg | ORAL_TABLET | Freq: Every day | ORAL | 3 refills | Status: DC

## 2019-07-13 ENCOUNTER — Other Ambulatory Visit: Payer: Self-pay

## 2019-07-13 NOTE — Telephone Encounter (Signed)
Refill request Oxycodone/APAP 

## 2019-07-14 MED ORDER — OXYCODONE-ACETAMINOPHEN 7.5-325 MG PO TABS: 1.0000 | ORAL_TABLET | Freq: Four times a day (QID) | ORAL | 0 refills | Status: DC | PRN

## 2019-07-15 NOTE — Telephone Encounter (Signed)
I just renewed this Rx last week, right??? ML

## 2019-07-15 NOTE — Telephone Encounter (Signed)
Yes, you did. I cannot refuse the order. It has to be done by a licensed provider.

## 2019-07-20 DIAGNOSIS — L89153 Pressure ulcer of sacral region, stage 3: Secondary | ICD-10-CM

## 2019-07-21 ENCOUNTER — Telehealth: Payer: Self-pay | Admitting: Hematology

## 2019-07-21 ENCOUNTER — Other Ambulatory Visit: Payer: Self-pay | Admitting: Family Medicine

## 2019-07-21 ENCOUNTER — Encounter: Payer: Self-pay | Admitting: Family Medicine

## 2019-07-21 DIAGNOSIS — S31000D Unspecified open wound of lower back and pelvis without penetration into retroperitoneum, subsequent encounter: Secondary | ICD-10-CM

## 2019-07-21 NOTE — Telephone Encounter (Signed)
Patient has questions about the sacral wound.  Home health will not come out and girlfriend has been treating it.  Per her the wound is not healing. / Patient would like to culture the wound if you will agree and provide the culture swab for her to pick up / I advised that you or Robbin would give her a call back

## 2019-07-22 ENCOUNTER — Telehealth: Payer: Self-pay

## 2019-07-22 NOTE — Telephone Encounter (Signed)
Patient will be offered an appointment on [facility] at 1[facility]. Also be put on a wait list.

## 2019-08-11 ENCOUNTER — Encounter (HOSPITAL_BASED_OUTPATIENT_CLINIC_OR_DEPARTMENT_OTHER): Payer: No Typology Code available for payment source | Admitting: Internal Medicine

## 2019-08-11 DIAGNOSIS — S24111A Complete lesion at T1 level of thoracic spinal cord, initial encounter: Secondary | ICD-10-CM | POA: Insufficient documentation

## 2019-08-23 ENCOUNTER — Other Ambulatory Visit: Payer: Self-pay

## 2019-08-24 ENCOUNTER — Encounter (HOSPITAL_BASED_OUTPATIENT_CLINIC_OR_DEPARTMENT_OTHER): Payer: Medicaid Other | Attending: Internal Medicine | Admitting: Physician Assistant

## 2019-08-24 ENCOUNTER — Other Ambulatory Visit: Payer: Self-pay

## 2019-08-24 DIAGNOSIS — Z7901 Long term (current) use of anticoagulants: Secondary | ICD-10-CM | POA: Diagnosis not present

## 2019-08-24 DIAGNOSIS — X58XXXA Exposure to other specified factors, initial encounter: Secondary | ICD-10-CM | POA: Diagnosis not present

## 2019-08-24 DIAGNOSIS — F1290 Cannabis use, unspecified, uncomplicated: Secondary | ICD-10-CM | POA: Insufficient documentation

## 2019-08-24 DIAGNOSIS — S91201A Unspecified open wound of right great toe with damage to nail, initial encounter: Secondary | ICD-10-CM | POA: Insufficient documentation

## 2019-08-24 DIAGNOSIS — G8221 Paraplegia, complete: Secondary | ICD-10-CM | POA: Insufficient documentation

## 2019-08-24 DIAGNOSIS — L89153 Pressure ulcer of sacral region, stage 3: Secondary | ICD-10-CM | POA: Insufficient documentation

## 2019-08-24 MED ORDER — OXYCODONE-ACETAMINOPHEN 7.5-325 MG PO TABS: 1.0000 | ORAL_TABLET | Freq: Four times a day (QID) | ORAL | 0 refills | Status: DC | PRN
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
## 2019-08-24 NOTE — Progress Notes (Signed)
AMBROSE, WILE ([id number]) Visit Report for 08/24/2019 Abuse/Suicide Risk Screen Details Patient Name: Date of Service: [name] 08/24/2019 2:45 PM Medical Record Number: [id number] Patient Account Number: [id number] Date of Birth/Sex: Treating RN: 05/16/1985 (34 y.o. Elizebeth Koller Primary Care Sharman Garrott: PA Zenovia Jordan, NO Other Clinician: Referring Alfio Loescher: Treating Lesta Limbert/Extender: Angus Palms Weeks in Treatment: 0 Abuse/Suicide Risk Screen Items Answer ABUSE RISK SCREEN: Has anyone close to you tried to hurt or harm you recentlyo No Do you feel uncomfortable with anyone in your familyo No Has anyone forced you do things that you didnt want to doo No Electronic Signature(s) Signed: 08/24/2019 5:49:00 PM By: Zandra Abts RN, BSN Entered By: Zandra Abts on 08/24/2019 16:08:39 -------------------------------------------------------------------------------- Activities of Daily Living Details Patient Name: Date of Service: Alison Stalling 08/24/2019 2:45 PM Medical Record Number: [id number] Patient Account Number: [id number] Date of Birth/Sex: Treating RN: 10/01/1985 (34 y.o. Elizebeth Koller Primary Care Rushawn Capshaw: PA Zenovia Jordan, NO Other Clinician: Referring Mckenna Boruff: Treating Gigi Onstad/Extender: Angus Palms Weeks in Treatment: 0 Activities of Daily Living Items Answer Activities of Daily Living (Please select one for each item) Drive Automobile Not Able T Medications ake Completely Able Use T elephone Completely Able Care for Appearance Completely Able Use T oilet Need Assistance Bath / Shower Need Assistance Dress Self Need Assistance Feed Self Completely Able Walk Not Able Get In / Out Bed Need Assistance Housework Need Assistance Prepare Meals Need Assistance Handle Money Completely Able Shop for Self Need Assistance Electronic Signature(s) Signed: 08/24/2019 5:49:00 PM By: Zandra Abts RN, BSN Entered By: Zandra Abts on 08/24/2019 16:09:17 -------------------------------------------------------------------------------- Education Screening Details Patient Name: Date of Service: [name] YNE 08/24/2019 2:45 PM Medical Record Number: [id number] Patient Account Number: [id number] Date of Birth/Sex: Treating RN: 1986/01/29 (34 y.o. Elizebeth Koller Primary Care Jabin Tapp: PA Zenovia Jordan, NO Other Clinician: Referring Florabel Faulks: Treating Cinda Hara/Extender: Albertine Grates in Treatment: 0 Primary Learner Assessed: Patient Learning Preferences/Education Level/Primary Language Learning Preference: Explanation, Demonstration, Printed Material Highest Education Level: High School Preferred Language: English Cognitive Barrier Language Barrier: No Translator Needed: No Memory Deficit: No Emotional Barrier: No Cultural/Religious Beliefs Affecting Medical Care: No Physical Barrier Impaired Vision: No Impaired Hearing: No Decreased Hand dexterity: No Knowledge/Comprehension Knowledge Level: High Comprehension Level: High Ability to understand written instructions: High Ability to understand verbal instructions: High Motivation Anxiety Level: Calm Cooperation: Cooperative Education Importance: Acknowledges Need Interest in Health Problems: Asks Questions Perception: Coherent Willingness to Engage in Self-Management High Activities: Readiness to Engage in Self-Management High Activities: Electronic Signature(s) Signed: 08/24/2019 5:49:00 PM By: Zandra Abts RN, BSN Entered By: Zandra Abts on 08/24/2019 16:10:13 -------------------------------------------------------------------------------- Fall Risk Assessment Details Patient Name: Date of Service: Duanne Guess, [name] 08/24/2019 2:45 PM Medical Record Number: [id number] Patient Account Number: [id number] Date of Birth/Sex: Treating RN: Aug 02, 1985 (34 y.o. Elizebeth Koller Primary Care Lydian Chavous: PA TIENT, NO Other  Clinician: Referring Treyden Hakim: Treating Auri Jahnke/Extender: Albertine Grates in Treatment: 0 Fall Risk Assessment Items Have you had 2 or more falls in the last 12 monthso 0 No Have you had any fall that resulted in injury in the last 12 monthso 0 No FALLS RISK SCREEN History of falling - immediate or within 3 months 0 No Secondary diagnosis (Do you have 2 or more medical diagnoseso) 0 No Ambulatory aid None/bed rest/wheelchair/nurse 0 Yes Crutches/cane/walker 0 No Furniture 0 No Intravenous therapy Access/Saline/Heparin Lock 0 No  Gait/Transferring Normal/ bed rest/ wheelchair 0 Yes Weak (short steps with or without shuffle, stooped but able to lift head while walking, may seek 0 No support from furniture) Impaired (short steps with shuffle, may have difficulty arising from chair, head down, impaired 0 No balance) Mental Status Oriented to own ability 0 Yes Electronic Signature(s) Signed: 08/24/2019 5:49:00 PM By: Zandra Abts RN, BSN Entered By: Zandra Abts on 08/24/2019 16:10:24 -------------------------------------------------------------------------------- Foot Assessment Details Patient Name: Date of Service: [name] YNE 08/24/2019 2:45 PM Medical Record Number: [id number] Patient Account Number: [id number] Date of Birth/Sex: Treating RN: 01/02/1986 (34 y.o. Elizebeth Koller Primary Care Elsia Lasota: PA TIENT, NO Other Clinician: Referring Sergio Zawislak: Treating Rexton Greulich/Extender: Angus Palms Weeks in Treatment: 0 Foot Assessment Items Site Locations + = Sensation present, - = Sensation absent, C = Callus, U = Ulcer R = Redness, W = Warmth, M = Maceration, PU = Pre-ulcerative lesion F = Fissure, S = Swelling, D = Dryness Assessment Right: Left: Other Deformity: No No Prior Foot Ulcer: No No Prior Amputation: No No Charcot Joint: No No Ambulatory Status: Ambulatory Without Help Gait: Steady Electronic  Signature(s) Signed: 08/24/2019 5:49:00 PM By: Zandra Abts RN, BSN Entered By: Zandra Abts on 08/24/2019 16:11:29 -------------------------------------------------------------------------------- Nutrition Risk Screening Details Patient Name: Date of Service: [name] YNE 08/24/2019 2:45 PM Medical Record Number: [id number] Patient Account Number: [id number] Date of Birth/Sex: Treating RN: 06/01/1985 (34 y.o. Elizebeth Koller Primary Care Tyson Parkison: PA TIENT, NO Other Clinician: Referring Crawford Tamura: Treating Teleshia Lemere/Extender: Angus Palms Weeks in Treatment: 0 Height (in): 72 Weight (lbs): 200 Body Mass Index (BMI): 27.1 Nutrition Risk Screening Items Score Screening NUTRITION RISK SCREEN: I have an illness or condition that made me change the kind and/or amount of food I eat 0 No I eat fewer than two meals per day 0 No I eat few fruits and vegetables, or milk products 0 No I have three or more drinks of beer, liquor or wine almost every day 0 No I have tooth or mouth problems that make it hard for me to eat 0 No I don't always have enough money to buy the food I need 0 No I eat alone most of the time 0 No I take three or more different prescribed or over-the-counter drugs a day 1 Yes Without wanting to, I have lost or gained 10 pounds in the last six months 0 No I am not always physically able to shop, cook and/or feed myself 2 Yes Nutrition Protocols Good Risk Protocol Moderate Risk Protocol 0 Provide education on nutrition High Risk Proctocol Risk Level: Moderate Risk Score: 3 Electronic Signature(s) Signed: 08/24/2019 5:49:00 PM By: Zandra Abts RN, BSN Entered By: Zandra Abts on 08/24/2019 16:10:54

## 2019-08-24 NOTE — Progress Notes (Signed)
[name], [name] ([id number]) Visit Report for 08/24/2019 Allergy List Details Patient Name: Date of Service: [name] [place] [facility] 2:45 PM Medical Record Number: [id number] Patient Account Number: [id number] Date of Birth/Sex: Treating RN: 03/05/1985 (34 y.o. Elizebeth KollerM) Lynch, Shatara Primary Care Thursa Emme: PA Zenovia JordanIENT, NO Other Clinician: Referring Chinmayi Rumer: Treating Kaye Mitro/Extender: Angus PalmsStone III, Hoyt Stroud, Natalie Weeks in Treatment: 0 Allergies Active Allergies No Known Drug Allergies Allergy Notes Electronic Signature(s) Signed: 08/24/2019 5:49:00 PM By: Zandra AbtsLynch, Shatara RN, BSN Entered By: Zandra AbtsLynch, Shatara on 08/24/2019 15:52:50 -------------------------------------------------------------------------------- Arrival Information Details Patient Name: Date of Service: [name] GuessJO [place] Y[place] 08/24/2019 2:45 PM Medical Record Number: [id number] Patient Account Number: [id number] Date of Birth/Sex: Treating RN: 04/03/1985 (34 y.o. Elizebeth KollerM) Lynch, Shatara Primary Care Cayne Yom: PA Zenovia JordanIENT, NO Other Clinician: Referring Autumn Pruitt: Treating Ermal Brzozowski/Extender: Albertine GratesStone III, Hoyt Stroud, Natalie Weeks in Treatment: 0 Visit Information Patient Arrived: Wheel Chair Arrival Time: 15:46 Accompanied By: spouse Transfer Assistance: Transfer Board Patient Identification Verified: Yes Secondary Verification Process Completed: Yes Patient Requires Transmission-Based Precautions: No Patient Has Alerts: Yes Patient Alerts: Patient on Blood Thinner Electronic Signature(s) Signed: 08/24/2019 5:49:00 PM By: Zandra AbtsLynch, Shatara RN, BSN Entered By: Zandra AbtsLynch, Shatara on 08/24/2019 16:16:46 -------------------------------------------------------------------------------- Clinic Level of Care Assessment Details Patient Name: Date of Service: [name] GlatterJO [place], [place] YNE 08/24/2019 2:45 PM Medical Record Number: [id number] Patient Account Number: [id number] Date of Birth/Sex: Treating RN: 12/15/1985 (34 y.o. [name] SchoonerM) Boehlein, Linda Primary Care  Wilmina Maxham: PA Zenovia [place], [place] Other Clinician: Referring Darshawn Boateng: Treating Kaydin Karbowski/Extender: Albertine GratesStone III, Hoyt Stroud, Natalie Weeks in Treatment: 0 Clinic Level of Care Assessment Items TOOL 2 Quantity Score []  - 0 Use when only an EandM is performed on the INITIAL visit ASSESSMENTS - Nursing Assessment / Reassessment X- 1 20 General Physical Exam (combine w/ comprehensive assessment (listed just below) when performed on new pt. evals) X- 1 25 Comprehensive Assessment (HX, ROS, Risk Assessments, Wounds Hx, etc.) ASSESSMENTS - Wound and Skin A ssessment / Reassessment []  - 0 Simple Wound Assessment / Reassessment - one wound X- 2 5 Complex Wound Assessment / Reassessment - multiple wounds []  - 0 Dermatologic / Skin Assessment (not related to wound area) ASSESSMENTS - Ostomy and/or Continence Assessment and Care []  - 0 Incontinence Assessment and Management []  - 0 Ostomy Care Assessment and Management (repouching, etc.) PROCESS - Coordination of Care X - Simple Patient / Family Education for ongoing care 1 15 []  - 0 Complex (extensive) Patient / Family Education for ongoing care X- 1 10 Staff obtains ChiropractorConsents, Records, T Results / Process Orders est []  - 0 Staff telephones HHA, Nursing Homes / Clarify orders / etc []  - 0 Routine Transfer to another Facility (non-emergent condition) []  - 0 Routine Hospital Admission (non-emergent condition) X- 1 15 New Admissions / Manufacturing engineernsurance Authorizations / Ordering NPWT Apligraf, etc. , []  - 0 Emergency Hospital Admission (emergent condition) X- 1 10 Simple Discharge Coordination []  - 0 Complex (extensive) Discharge Coordination PROCESS - Special Needs []  - 0 Pediatric / Minor Patient Management []  - 0 Isolation Patient Management []  - 0 Hearing / Language / Visual special needs []  - 0 Assessment of Community assistance (transportation, D/C planning, etc.) []  - 0 Additional assistance / Altered mentation []  - 0 Support Surface(s)  Assessment (bed, cushion, seat, etc.) INTERVENTIONS - Wound Cleansing / Measurement X- 1 5 Wound Imaging (photographs - any number of wounds) []  - 0 Wound Tracing (instead of photographs) []  - 0 Simple Wound Measurement - one wound X- 2 5 Complex Wound  Measurement - multiple wounds []  - 0 Simple Wound Cleansing - one wound X- 2 5 Complex Wound Cleansing - multiple wounds INTERVENTIONS - Wound Dressings X - Small Wound Dressing one or multiple wounds 1 10 []  - 0 Medium Wound Dressing one or multiple wounds []  - 0 Large Wound Dressing one or multiple wounds []  - 0 Application of Medications - injection INTERVENTIONS - Miscellaneous []  - 0 External ear exam []  - 0 Specimen Collection (cultures, biopsies, blood, body fluids, etc.) []  - 0 Specimen(s) / Culture(s) sent or taken to Lab for analysis X- 1 10 Patient Transfer (multiple staff / Lift / Similar devices) []  - 0 Simple Staple / Suture removal (25 or less) []  - 0 Complex Staple / Suture removal (26 or more) []  - 0 Hypo / Hyperglycemic Management (close monitor of Blood Glucose) X- 1 15 Ankle / Brachial Index (ABI) - do not check if billed separately Has the patient been seen at the hospital within the last three years: Yes Total Score: 165 Level Of Care: New/Established - Level 5 Electronic Signature(s) Signed: 08/24/2019 5:49:12 PM By: RN, BSN Entered By: on 08/24/2019 16:39:23 -------------------------------------------------------------------------------- Encounter Discharge Information Details Patient Name: Date of Service: , YNE 08/24/2019 2:45 PM Medical Record Number: Patient Account Number: Date of Birth/Sex: Treating RN: 09/27/1985 (34 y.o. [name] Ford Deed Primary Care Geneve Kimpel: PA [name] Ford Deed, NO Other Clinician: Referring Ceonna Frazzini: Treating Zephaniah Lubrano/Extender: 08/26/2019 in Treatment: 0 Encounter Discharge  Information Items Discharge Condition: Stable Ambulatory Status: Ambulatory Discharge Destination: Home Transportation: Private Auto Accompanied By: wife Schedule Follow-up Appointment: Yes Clinical Summary of Care: Patient Declined Electronic Signature(s) Signed: 08/24/2019 5:31:10 PM By: [place] RN Entered By: 08/26/2019 on 08/24/2019 17:18:28 -------------------------------------------------------------------------------- Lower Extremity Assessment Details Patient Name: Date of Service: [id number] 08/24/2019 2:45 PM Medical Record Number: [id number] Patient Account Number: Melonie [place] Date of Birth/Sex: Treating RN: Jul 26, 1985 (34 y.o. 08/26/2019 Primary Care Kaydyn Chism: PA [name], NO Other Clinician: Referring Maryalyce Sanjuan: Treating Jarren Para/Extender: [name] Weeks in Treatment: 0 Edema Assessment Assessed: [Left: No] [Right: No] Edema: [Left: N] [Right: o] Vascular Assessment Pulses: Dorsalis Pedis Palpable: [Right:Yes] Blood Pressure: Brachial: [Right:113] Ankle: [Right:Dorsalis Pedis: 102 0.90] Electronic Signature(s) Signed: 08/24/2019 5:49:00 PM By: Alison Stalling RN, BSN Entered By: 08/26/2019 on 08/24/2019 16:12:40 -------------------------------------------------------------------------------- Multi-Disciplinary Care Plan Details Patient Name: Date of Service: [id number], 06/03/1985 YNE 08/24/2019 2:45 PM Medical Record Number: Elizebeth Koller Patient Account Number: Zenovia Jordan Date of Birth/Sex: Treating RN: 09-Apr-1985 (34 y.o. Zandra Abts Primary Care Michel Eskelson: PA Zandra Abts, 08/26/2019 Other Clinician: Referring Eldonna Neuenfeldt: Treating Romell Wolden/Extender: [name] Ford in Treatment: 0 Active Inactive Nutrition Nursing Diagnoses: Potential for alteratiion in Nutrition/Potential for imbalanced nutrition Goals: Patient/caregiver agrees to and verbalizes understanding of need to use nutritional supplements and/or  vitamins as prescribed Date Initiated: 08/24/2019 T arget Resolution Date: 09/21/2019 Goal Status: Active Interventions: Assess patient nutrition upon admission and as needed per policy Treatment Activities: Patient referred to Primary Care Physician for further nutritional evaluation : 08/24/2019 Notes: Pressure Nursing Diagnoses: Knowledge deficit related to causes and risk factors for pressure ulcer development Knowledge deficit related to management of pressures ulcers Potential for impaired tissue integrity related to pressure, friction, moisture, and shear Goals: Patient will remain free from development of additional pressure ulcers Date Initiated: 08/24/2019 Target Resolution Date: 09/21/2019 Goal Status: Active Patient/caregiver will verbalize understanding of pressure ulcer  management Date Initiated: 08/24/2019 Target Resolution Date: 09/21/2019 Goal Status: Active Interventions: Assess: immobility, friction, shearing, incontinence upon admission and as needed Assess offloading mechanisms upon admission and as needed Assess potential for pressure ulcer upon admission and as needed Notes: Wound/Skin Impairment Nursing Diagnoses: Impaired tissue integrity Knowledge deficit related to ulceration/compromised skin integrity Goals: Patient will demonstrate a reduced rate of smoking or cessation of smoking Date Initiated: 08/24/2019 Target Resolution Date: 09/21/2019 Goal Status: Active Patient/caregiver will verbalize understanding of skin care regimen Date Initiated: 08/24/2019 Target Resolution Date: 09/21/2019 Goal Status: Active Ulcer/skin breakdown will have a volume reduction of 30% by week 4 Date Initiated: 08/24/2019 Target Resolution Date: 09/21/2019 Goal Status: Active Interventions: Assess patient/caregiver ability to obtain necessary supplies Assess patient/caregiver ability to perform ulcer/skin care regimen upon admission and as needed Provide education on ulcer  and skin care Treatment Activities: Skin care regimen initiated : 08/24/2019 Topical wound management initiated : 08/24/2019 Notes: Electronic Signature(s) Signed: 08/24/2019 5:49:12 PM By: [name] Ford Deed RN, BSN Entered By: [name] Ford Deed on 08/24/2019 16:30:13 -------------------------------------------------------------------------------- Pain Assessment Details Patient Name: Date of Service: [name] Ford YNE 08/24/2019 2:45 PM Medical Record Number: [id number] Patient Account Number: [id number] Date of Birth/Sex: Treating RN: 06/05/1985 (34 y.o. Elizebeth Koller Primary Care Sameeha Rockefeller: PA Zenovia Jordan, NO Other Clinician: Referring Marquerite Forsman: Treating Koven Belinsky/Extender: Angus Palms Weeks in Treatment: 0 Active Problems Location of Pain Severity and Description of Pain Patient Has Paino No Site Locations Pain Management and Medication Current Pain Management: Electronic Signature(s) Signed: 08/24/2019 5:49:00 PM By: Zandra Abts RN, BSN Entered By: Zandra Abts on 08/24/2019 16:15:51 -------------------------------------------------------------------------------- Patient/Caregiver Education Details Patient Name: Date of Service: [name] Ford YNE 6/30/2021andnbsp2:45 PM Medical Record Number: [id number] Patient Account Number: [id number] Date of Birth/Gender: Treating RN: 10-15-1985 (34 y.o. [name] Ford Primary Care Physician: PA Zenovia Jordan, [place] Other Clinician: Referring Physician: Treating Physician/Extender: Albertine Grates in Treatment: 0 Education Assessment Education Provided To: Patient Education Topics Provided Pressure: Handouts: Pressure Ulcers: Care and Offloading, Pressure Ulcers: Care and Offloading 2 Methods: Explain/Verbal, Printed Responses: Reinforcements needed, State content correctly Wound/Skin Impairment: Handouts: Caring for Your Ulcer, Skin Care Do's and Dont's Methods: Explain/Verbal,  Printed Responses: Reinforcements needed, State content correctly Electronic Signature(s) Signed: 08/24/2019 5:49:12 PM By: [name] Ford Deed RN, BSN Entered By: [name] Ford Deed on 08/24/2019 16:32:58 -------------------------------------------------------------------------------- Wound Assessment Details Patient Name: Date of Service: [name] Ford YNE 08/24/2019 2:45 PM Medical Record Number: [id number] Patient Account Number: [id number] Date of Birth/Sex: Treating RN: April 26, 1985 (34 y.o. Elizebeth Koller Primary Care Yaniel Limbaugh: PA TIENT, NO Other Clinician: Referring Amardeep Beckers: Treating Zaylie Gisler/Extender: Angus Palms Weeks in Treatment: 0 Wound Status Wound Number: 1 Primary Etiology: Trauma, Other Wound Location: Right, Medial T Great oe Wound Status: Open Wounding Event: Trauma Comorbid History: Paraplegia Date Acquired: 08/24/2019 Weeks Of Treatment: 0 Clustered Wound: No Wound Measurements Length: (cm) 2.3 Width: (cm) 2 Depth: (cm) 0.3 Area: (cm) 3.613 Volume: (cm) 1.084 % Reduction in Area: % Reduction in Volume: Epithelialization: None Tunneling: No Undermining: No Wound Description Classification: Full Thickness With Exposed Support Structures Wound Margin: Flat and Intact Exudate Amount: Medium Exudate Type: Sanguinous Exudate Color: red Foul Odor After Cleansing: No Slough/Fibrino No Wound Bed Granulation Amount: Large (67-100%) Exposed Structure Granulation Quality: Red Fascia Exposed: No Necrotic Amount: None Present (0%) Fat Layer (Subcutaneous Tissue) Exposed: Yes Tendon Exposed: No Muscle Exposed: No Joint Exposed: No Bone Exposed: Yes Treatment Notes Wound #1 (Right,  Medial Toe Great) 1. Cleanse With Wound Cleanser 3. Primary Dressing Applied [employer] Other primary dressing (specifiy in notes) 4. Secondary Dressing Dry Gauze 5. Secured With Tape Notes normal saline Electronic Signature(s) Signed: 08/24/2019  5:49:00 PM By: Zandra Abts RN, BSN Entered By: Zandra Abts on 08/24/2019 16:14:15 -------------------------------------------------------------------------------- Wound Assessment Details Patient Name: Date of Service: [name] Ford YNE 08/24/2019 2:45 PM Medical Record Number: [id number] Patient Account Number: [id number] Date of Birth/Sex: Treating RN: [birth year] (34 y.o. Elizebeth Koller Primary Care Baily Hovanec: PA TIENT, NO Other Clinician: Referring Sukhman Martine: Treating Khyran Riera/Extender: Angus Palms Weeks in Treatment: 0 Wound Status Wound Number: 2 Primary Etiology: Pressure Ulcer Wound Location: Sacrum Wound Status: Open Wounding Event: Pressure Injury Comorbid History: Paraplegia Date Acquired: 05/09/2019 Weeks Of Treatment: 0 Clustered Wound: No Wound Measurements Length: (cm) 6 Width: (cm) 3.4 Depth: (cm) 1.5 Area: (cm) 16.022 Area: (cm) 16.022 Volume: (cm) 24.033 % Reduction in Area: % Reduction in Volume: Epithelialization: None Tunneling: Yes Tunneling: Yes Position (o'clock): 12 Maximum Distance: (cm) 2.7 Undermining: No Wound Description Classification: Category/Stage III Wound Margin: Flat and Intact Exudate Amount: Medium Exudate Type: Serosanguineous Exudate Color: red, brown Foul Odor After Cleansing: No Slough/Fibrino Yes Wound Bed Granulation Amount: Large (67-100%) Exposed Structure Granulation Quality: Red Fascia Exposed: No Necrotic Amount: Small (1-33%) Fat Layer (Subcutaneous Tissue) Exposed: Yes Necrotic Quality: Adherent Slough Tendon Exposed: No Muscle Exposed: No Joint Exposed: No Bone Exposed: No Treatment Notes Wound #2 (Sacrum) 1. Cleanse With Wound Cleanser 3. Primary Dressing Applied [employer] Other primary dressing (specifiy in notes) 4. Secondary Dressing Dry Gauze 5. Secured With Tape Notes normal saline to collagen, vashe moist wet to dry Electronic Signature(s) Signed: 08/24/2019  5:49:00 PM By: Zandra Abts RN, BSN Entered By: Zandra Abts on 08/24/2019 16:15:38 -------------------------------------------------------------------------------- Vitals Details Patient Name: Date of Service: [name] Ford, [name] 08/24/2019 2:45 PM Medical Record Number: [id number] Patient Account Number: [id number] Date of Birth/Sex: Treating RN: 1985-03-14 (34 y.o. Elizebeth Koller Primary Care Yoltzin Barg: PA TIENT, NO Other Clinician: Referring Moriya Mitchell: Treating Niamya Vittitow/Extender: Angus Palms Weeks in Treatment: 0 Vital Signs Time Taken: 15:51 Temperature (F): 97.6 Height (in): 72 Pulse (bpm): 71 Source: Stated Respiratory Rate (breaths/min): 18 Weight (lbs): 200 Blood Pressure (mmHg): 113/81 Source: Stated Reference Range: 80 - 120 mg / dl Body Mass Index (BMI): 27.1 Electronic Signature(s) Signed: 08/24/2019 5:49:00 PM By: Zandra Abts RN, BSN Entered By: Zandra Abts on 08/24/2019 15:52:13

## 2019-08-25 ENCOUNTER — Other Ambulatory Visit: Payer: Self-pay | Admitting: Physical Medicine and Rehabilitation

## 2019-08-25 MED ORDER — OXYCODONE-ACETAMINOPHEN 7.5-325 MG PO TABS: 1.0000 | ORAL_TABLET | Freq: Four times a day (QID) | ORAL | 0 refills | Status: DC | PRN

## 2019-08-30 ENCOUNTER — Encounter (HOSPITAL_BASED_OUTPATIENT_CLINIC_OR_DEPARTMENT_OTHER): Payer: No Typology Code available for payment source | Admitting: Internal Medicine

## 2019-09-02 ENCOUNTER — Other Ambulatory Visit: Payer: Self-pay

## 2019-09-02 ENCOUNTER — Encounter (HOSPITAL_BASED_OUTPATIENT_CLINIC_OR_DEPARTMENT_OTHER): Payer: Medicaid Other | Attending: Internal Medicine | Admitting: Internal Medicine

## 2019-09-02 DIAGNOSIS — G8221 Paraplegia, complete: Secondary | ICD-10-CM | POA: Insufficient documentation

## 2019-09-02 DIAGNOSIS — L89153 Pressure ulcer of sacral region, stage 3: Secondary | ICD-10-CM | POA: Insufficient documentation

## 2019-09-02 DIAGNOSIS — Z7901 Long term (current) use of anticoagulants: Secondary | ICD-10-CM | POA: Insufficient documentation

## 2019-09-06 NOTE — Progress Notes (Addendum)
FELIPE, PALUCH ([id number]) Visit Report for 09/02/2019 Arrival Information Details Patient Name: Date of Service: [name] YNE 09/02/2019 2:15 PM Medical Record Number: [id number] Patient Account Number: [id number] Date of Birth/Sex: Treating RN: 12-Dec-1985 (34 y.o. Judie Petit) [name] Primary Care Rayana Geurin: PA Zenovia Jordan, [place] Other Clinician: Referring Cortney Beissel: Treating Althia Egolf/Extender: [name] in Treatment: 1 Visit Information History Since Last Visit All ordered tests and consults were completed: No Patient Arrived: Wheel Chair Added or deleted any medications: No Arrival Time: 14:42 Any new allergies or adverse reactions: No Accompanied By: self Had a fall or experienced change in No Transfer Assistance: None activities of daily living that may affect Patient Identification Verified: Yes risk of falls: Secondary Verification Process Completed: Yes Signs or symptoms of abuse/neglect since last visito No Patient Requires Transmission-Based Precautions: No Hospitalized since last visit: No Patient Has Alerts: Yes Implantable device outside of the clinic excluding No Patient Alerts: Patient on Blood Thinner cellular tissue based products placed in the center since last visit: Has Dressing in Place as Prescribed: Yes Has Compression in Place as Prescribed: Yes Pain Present Now: No Electronic Signature(s) Signed: 09/05/2019 5:13:16 PM By: [name] RN Entered By: [name] on 09/02/2019 14:43:19 -------------------------------------------------------------------------------- Clinic Level of Care Assessment Details Patient Name: Date of Service: Alison Stalling 09/02/2019 2:15 PM Medical Record Number: [id number] Patient Account Number: [id number] Date of Birth/Sex: Treating RN: 1985/07/26 (34 y.o. [name] Primary Care Denaly Gatling: PA Zenovia Jordan, NO Other Clinician: Referring Terree Gaultney: Treating Ihan Pat/Extender: [name] in Treatment: 1 Clinic Level of Care Assessment Items TOOL 4 Quantity Score X- 1 0 Use when only an EandM is performed on FOLLOW-UP visit ASSESSMENTS - Nursing Assessment / Reassessment X- 1 10 Reassessment of Co-morbidities (includes updates in patient status) X- 1 5 Reassessment of Adherence to Treatment Plan ASSESSMENTS - Wound and Skin A ssessment / Reassessment []  - 0 Simple Wound Assessment / Reassessment - one wound X- 2 5 Complex Wound Assessment / Reassessment - multiple wounds []  - 0 Dermatologic / Skin Assessment (not related to wound area) ASSESSMENTS - Focused Assessment []  - 0 Circumferential Edema Measurements - multi extremities []  - 0 Nutritional Assessment / Counseling / Intervention []  - 0 Lower Extremity Assessment (monofilament, tuning fork, pulses) []  - 0 Peripheral Arterial Disease Assessment (using hand held doppler) ASSESSMENTS - Ostomy and/or Continence Assessment and Care []  - 0 Incontinence Assessment and Management []  - 0 Ostomy Care Assessment and Management (repouching, etc.) PROCESS - Coordination of Care X - Simple Patient / Family Education for ongoing care 1 15 []  - 0 Complex (extensive) Patient / Family Education for ongoing care X- 1 10 Staff obtains , Records, T Results / Process Orders est []  - 0 Staff telephones HHA, Nursing Homes / Clarify orders / etc []  - 0 Routine Transfer to another Facility (non-emergent condition) []  - 0 Routine Hospital Admission (non-emergent condition) []  - 0 New Admissions / / Ordering NPWT Apligraf, etc. , []  - 0 Emergency Hospital Admission (emergent condition) X- 1 10 Simple Discharge Coordination []  - 0 Complex (extensive) Discharge Coordination PROCESS - Special Needs []  - 0 Pediatric / Minor Patient Management []  - 0 Isolation Patient Management []  - 0 Hearing / Language / Visual special needs []  - 0 Assessment of Community assistance  (transportation, D/C planning, etc.) []  - 0 Additional assistance / Altered mentation []  - 0 Support Surface(s) Assessment (bed, cushion, seat, etc.) INTERVENTIONS -  Wound Cleansing / Measurement []  - 0 Simple Wound Cleansing - one wound X- 2 5 Complex Wound Cleansing - multiple wounds X- 1 5 Wound Imaging (photographs - any number of wounds) []  - 0 Wound Tracing (instead of photographs) []  - 0 Simple Wound Measurement - one wound X- 2 5 Complex Wound Measurement - multiple wounds INTERVENTIONS - Wound Dressings X - Small Wound Dressing one or multiple wounds 2 10 []  - 0 Medium Wound Dressing one or multiple wounds []  - 0 Large Wound Dressing one or multiple wounds X- 1 5 Application of Medications - topical []  - 0 Application of Medications - injection INTERVENTIONS - Miscellaneous []  - 0 External ear exam []  - 0 Specimen Collection (cultures, biopsies, blood, body fluids, etc.) []  - 0 Specimen(s) / Culture(s) sent or taken to Lab for analysis []  - 0 Patient Transfer (multiple staff / / Similar devices) []  - 0 Simple Staple / Suture removal (25 or less) []  - 0 Complex Staple / Suture removal (26 or more) []  - 0 Hypo / Hyperglycemic Management (close monitor of Blood Glucose) []  - 0 Ankle / Brachial Index (ABI) - do not check if billed separately X- 1 5 Vital Signs Has the patient been seen at the hospital within the last three years: Yes Total Score: 115 Level Of Care: New/Established - Level 3 Electronic Signature(s) Signed: 09/02/2019 5:57:27 PM By: Entered By: on 09/02/2019 15:27:43 -------------------------------------------------------------------------------- Encounter Discharge Information Details Patient Name: Date of Service: , YNE 09/02/2019 2:15 PM Medical Record Number: Patient Account Number: Date of Birth/Sex: Treating RN: 1985-08-18 (34 y.o. Primary Care Imajean Mcdermid: PA , NO Other Clinician: Referring Markiah Janeway: Treating Juaquina Machnik/Extender: in Treatment: 1 Encounter Discharge Information Items Discharge Condition: Stable Ambulatory Status: Wheelchair Discharge Destination: Home Transportation: Other Accompanied By: girlfriend Schedule Follow-up Appointment: Yes Clinical Summary of Care: Patient Declined Electronic Signature(s) Signed: 09/02/2019 5:57:27 PM By: Cherylin Mylar Entered By: Cherylin Mylar on 09/02/2019 15:41:44 -------------------------------------------------------------------------------- Multi Wound Chart Details Patient Name: Date of Service: Duanne Guess, [name] 09/02/2019 2:15 PM Medical Record Number: [id number] Patient Account Number: [id number] Date of Birth/Sex: Treating RN: 03-28-1985 (34 y.o. [name] Primary Care Deetra Booton: PA Zenovia Jordan, [place] Other Clinician: Referring Aleicia Kenagy: Treating Laster Appling/Extender: [name] in Treatment: 1 Vital Signs Height(in): 72 Pulse(bpm): 83 Weight(lbs): 200 Blood Pressure(mmHg): 118/81 Body Mass Index(BMI): 27 Temperature(F): 97.7 Respiratory Rate(breaths/min): 18 Photos: [1:No Photos Right, Medial T Great oe] [2:No Photos Sacrum] [N/A:N/A N/A] Wound Location: [1:Trauma] [2:Pressure Injury] [N/A:N/A] Wounding Event: [1:Trauma, Other] [2:Pressure Ulcer] [N/A:N/A] Primary Etiology: [1:Paraplegia] [2:Paraplegia] [N/A:N/A] Comorbid History: [1:08/24/2019] [2:05/09/2019] [N/A:N/A] Date Acquired: [1:1] [2:1] [N/A:N/A] Weeks of Treatment: [1:Open] [2:Open] [N/A:N/A] Wound Status: [1:1.7x1x0.1] [2:5.2x3.3x1.5] [N/A:N/A] Measurements L x W x D (cm) [1:1.335] [2:13.477] [N/A:N/A] A (cm) : rea [1:0.134] [2:20.216] [N/A:N/A] Volume (cm) : [1:63.10%] [2:15.90%] [N/A:N/A] % Reduction in A rea: [1:87.60%] [2:15.90%] [N/A:N/A] % Reduction in Volume: [2:11] Starting Position 1  (o'clock): [2:12] Ending Position 1 (o'clock): [2:1.5] Maximum Distance 1 (cm): [1:No] [2:Yes] [N/A:N/A] Undermining: [1:Full Thickness With Exposed Support Category/Stage III] [N/A:N/A] Classification: [1:Structures Medium] [2:Medium] [N/A:N/A] Exudate Amount: [1:Sanguinous] [2:Serosanguineous] [N/A:N/A] Exudate Type: [1:red] [2:red, brown] [N/A:N/A] Exudate Color: [1:Flat and Intact] [2:Flat and Intact] [N/A:N/A] Wound Margin: [1:Large (67-100%)] [2:Large (67-100%)] [N/A:N/A] Granulation Amount: [1:Red] [2:Red] [N/A:N/A] Granulation Quality: [1:None Present (0%)] [2:Small (1-33%)] [N/A:N/A] Necrotic Amount: [1:Fat Layer (Subcutaneous Tissue)] [2:Fat Layer (Subcutaneous Tissue)] [N/A:N/A]  Exposed Structures: [1:Exposed: Yes Bone: Yes Fascia: No Tendon: No Muscle: No Joint: No None] [2:Exposed: Yes Fascia: No Tendon: No Muscle: No Joint: No Bone: No None] [N/A:N/A] Treatment Notes Electronic Signature(s) Signed: 09/02/2019 5:57:27 PM By: Cherylin Mylarwiggins, Shannon Signed: 09/06/2019 8:12:39 AM By: Baltazar Najjarobson, Michael MD Entered By: Baltazar Najjarobson, Michael on 09/02/2019 15:29:24 -------------------------------------------------------------------------------- Multi-Disciplinary Care Plan Details Patient Name: Date of Service: Duanne GuessJO [place], [place] YNE 09/02/2019 2:15 PM Medical Record Number: [id number] Patient Account Number: [id number] Date of Birth/Sex: Treating RN: 06/10/1985 (34 y.o. Katherina RightM) Dwiggins, Shannon Primary Care Tykira Wachs: PA Zenovia JordanIENT, NO Other Clinician: Referring Mairely Foxworth: Treating Bobie Kistler/Extender: Dorian Heckleobson, Michael Stroud, Natalie Weeks in Treatment: 1 Active Inactive Nutrition Nursing Diagnoses: Potential for alteratiion in Nutrition/Potential for imbalanced nutrition Goals: Patient/caregiver agrees to and verbalizes understanding of need to use nutritional supplements and/or vitamins as prescribed Date Initiated: 08/24/2019 Target Resolution Date: 09/21/2019 Goal Status: Active Interventions: Assess  patient nutrition upon admission and as needed per policy Treatment Activities: Patient referred to Primary Care Physician for further nutritional evaluation : 08/24/2019 Notes: Pressure Nursing Diagnoses: Knowledge deficit related to causes and risk factors for pressure ulcer development Knowledge deficit related to management of pressures ulcers Potential for impaired tissue integrity related to pressure, friction, moisture, and shear Goals: Patient will remain free from development of additional pressure ulcers Date Initiated: 08/24/2019 Target Resolution Date: 09/21/2019 Goal Status: Active Patient/caregiver will verbalize understanding of pressure ulcer management Date Initiated: 08/24/2019 Target Resolution Date: 09/21/2019 Goal Status: Active Interventions: Assess: immobility, friction, shearing, incontinence upon admission and as needed Assess offloading mechanisms upon admission and as needed Assess potential for pressure ulcer upon admission and as needed Notes: Wound/Skin Impairment Nursing Diagnoses: Impaired tissue integrity Knowledge deficit related to ulceration/compromised skin integrity Goals: Patient will demonstrate a reduced rate of smoking or cessation of smoking Date Initiated: 08/24/2019 Target Resolution Date: 09/21/2019 Goal Status: Active Patient/caregiver will verbalize understanding of skin care regimen Date Initiated: 08/24/2019 Target Resolution Date: 09/21/2019 Goal Status: Active Ulcer/skin breakdown will have a volume reduction of 30% by week 4 Date Initiated: 08/24/2019 Target Resolution Date: 09/21/2019 Goal Status: Active Interventions: Assess patient/caregiver ability to obtain necessary supplies Assess patient/caregiver ability to perform ulcer/skin care regimen upon admission and as needed Provide education on ulcer and skin care Treatment Activities: Skin care regimen initiated : 08/24/2019 Topical wound management initiated :  08/24/2019 Notes: Electronic Signature(s) Signed: 09/02/2019 5:57:27 PM By: Cherylin Mylarwiggins, Shannon Entered By: Cherylin Mylarwiggins, Shannon on 09/02/2019 14:40:30 -------------------------------------------------------------------------------- Pain Assessment Details Patient Name: Date of Service: Azzie GlatterJO [place], [place] YNE 09/02/2019 2:15 PM Medical Record Number: [id number] Patient Account Number: [id number] Date of Birth/Sex: Treating RN: 10/08/1985 (34 y.o. Melonie [place]) Epps, Carrie Primary Care Dawnelle Warman: PA Zenovia JordanIENT, NO Other Clinician: Referring Aahna Rossa: Treating Laurell Coalson/Extender: Dorian Heckleobson, Michael Stroud, Natalie Weeks in Treatment: 1 Active Problems Location of Pain Severity and Description of Pain Patient Has Paino No Site Locations Pain Management and Medication Current Pain Management: Electronic Signature(s) Signed: 09/05/2019 5:13:16 PM By: Yevonne PaxEpps, Carrie RN Entered By: Yevonne PaxEpps, Carrie on 09/02/2019 14:43:53 -------------------------------------------------------------------------------- Patient/Caregiver Education Details Patient Name: Date of Service: Azzie GlatterJO [place], [place] YNE 7/9/2021andnbsp2:15 PM Medical Record Number: [id number] Patient Account Number: [id number] Date of Birth/Gender: Treating RN: 05/28/1985 (34 y.o. Katherina RightM) Dwiggins, Shannon Primary Care Physician: PA Zenovia [place], [place] Other Clinician: Referring Physician: Treating Physician/Extender: Dorian Heckleobson, Michael Stroud, Natalie Weeks in Treatment: 1 Education Assessment Education Provided To: Patient Education Topics Provided Wound/Skin Impairment: Handouts: Caring for Your Ulcer Methods: Explain/Verbal Responses: State content correctly Electronic Signature(s) Signed: 09/02/2019 5:57:27 PM By:  Dwiggins, Carollee Herter Entered By: Cherylin Mylar on 09/02/2019 15:16:07 -------------------------------------------------------------------------------- Wound Assessment Details Patient Name: Date of Service: Alison Stalling 09/02/2019 2:15 PM Medical Record Number:  [id number] Patient Account Number: [id number] Date of Birth/Sex: Treating RN: 1985-04-26 (34 y.o. Judie Petit) [name] Primary Care Shanele Nissan: PA Zenovia Jordan, [place] Other Clinician: Referring Eldene Plocher: Treating Deyjah Kindel/Extender: [name] in Treatment: 1 Wound Status Wound Number: 1 Primary Etiology: Trauma, Other Wound Location: Right, Medial T Great oe Wound Status: Open Wounding Event: Trauma Comorbid History: Paraplegia Date Acquired: 08/24/2019 Weeks Of Treatment: 1 Clustered Wound: No Photos Photo Uploaded By: Benjaman Kindler on 09/05/2019 15:40:51 Wound Measurements Length: (cm) 1.7 Width: (cm) 1 Depth: (cm) 0.1 Area: (cm) 1.335 Volume: (cm) 0.134 % Reduction in Area: 63.1% % Reduction in Volume: 87.6% Epithelialization: None Tunneling: No Undermining: No Wound Description Classification: Full Thickness With Exposed Support Structures Wound Margin: Flat and Intact Exudate Amount: Medium Exudate Type: Sanguinous Exudate Color: red Foul Odor After Cleansing: No Slough/Fibrino No Wound Bed Granulation Amount: Large (67-100%) Exposed Structure Granulation Quality: Red Fascia Exposed: No Necrotic Amount: None Present (0%) Fat Layer (Subcutaneous Tissue) Exposed: Yes Tendon Exposed: No Muscle Exposed: No Joint Exposed: No Bone Exposed: Yes Treatment Notes Wound #1 (Right, Medial Toe Great) 1. Cleanse With Wound Cleanser 2. Periwound Care Skin Prep 3. Primary Dressing Applied Calcium Alginate Ag 4. Secondary Dressing Dry Gauze Roll Gauze 5. Secured With Secretary/administrator) Signed: 09/05/2019 5:13:16 PM By: [name] RN Entered By: [name] on 09/02/2019 14:44:42 -------------------------------------------------------------------------------- Wound Assessment Details Patient Name: Date of Service: [name] YNE 09/02/2019 2:15 PM Medical Record Number: [id number] Patient Account Number: [id number] Date of  Birth/Sex: Treating RN: 09/29/1985 (34 y.o. Judie Petit) [name] Primary Care Teneka Malmberg: PA Zenovia Jordan, [place] Other Clinician: Referring Yancy Knoble: Treating Shaquinta Peruski/Extender: [name] in Treatment: 1 Wound Status Wound Number: 2 Primary Etiology: Pressure Ulcer Wound Location: Sacrum Wound Status: Open Wounding Event: Pressure Injury Comorbid History: Paraplegia Date Acquired: 05/09/2019 Weeks Of Treatment: 1 Clustered Wound: No Photos Photo Uploaded By: Benjaman Kindler on 09/05/2019 15:40:51 Wound Measurements Length: (cm) 5.2 % Re Width: (cm) 3.3 % Re Depth: (cm) 1.5 Epit Area: (cm) 13.477 Tun Volume: (cm) 20.216 Und S E M duction in Area: 15.9% duction in Volume: 15.9% helialization: None neling: No ermining: Yes tarting Position (o'clock): 11 nding Position (o'clock): 12 aximum Distance: (cm) 1.5 Wound Description Classification: Category/Stage III Fou Wound Margin: Flat and Intact Slo Exudate Amount: Medium Exudate Type: Serosanguineous Exudate Color: red, brown l Odor After Cleansing: No ugh/Fibrino Yes Wound Bed Granulation Amount: Large (67-100%) Exposed Structure Granulation Quality: Red Fascia Exposed: No Necrotic Amount: Small (1-33%) Fat Layer (Subcutaneous Tissue) Exposed: Yes Necrotic Quality: Adherent Slough Tendon Exposed: No Muscle Exposed: No Joint Exposed: No Bone Exposed: No Treatment Notes Wound #2 (Sacrum) 1. Cleanse With Wound Cleanser 2. Periwound Care Skin Prep 3. Primary Dressing Applied Collegen AG 4. Secondary Dressing Other secondary dressing (specify in notes) Foam Border Dressing Notes normal saline to collagen, vashe moist wet to dry Electronic Signature(s) Signed: 09/05/2019 5:13:16 PM By: [name] RN Entered By: [name] on 09/02/2019 14:45:05 -------------------------------------------------------------------------------- Vitals Details Patient Name: Date of Service: Duanne Guess, [name]  09/02/2019 2:15 PM Medical Record Number: [id number] Patient Account Number: [id number] Date of Birth/Sex: Treating RN: [birth year] (34 y.o. Melonie [place] Primary Care Chaze Hruska: PA Zenovia Jordan, [place] Other Clinician: Referring Xela Oregel: Treating Terriann Difonzo/Extender: [name] in Treatment: 1 Vital  Signs Time Taken: 14:42 Temperature (F): 97.7 Height (in): 72 Pulse (bpm): 83 Weight (lbs): 200 Respiratory Rate (breaths/min): 18 Body Mass Index (BMI): 27.1 Blood Pressure (mmHg): 118/81 Reference Range: 80 - 120 mg / dl Electronic Signature(s) Signed: 09/05/2019 5:13:16 PM By: [name] RN Entered By: [name] on 09/02/2019 14:43:46
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
## 2019-09-15 ENCOUNTER — Encounter (HOSPITAL_BASED_OUTPATIENT_CLINIC_OR_DEPARTMENT_OTHER): Payer: Medicaid Other | Admitting: Internal Medicine

## 2019-09-21 ENCOUNTER — Ambulatory Visit: Payer: Self-pay | Admitting: Family Medicine

## 2019-10-03 ENCOUNTER — Other Ambulatory Visit: Payer: Self-pay

## 2019-10-04 MED ORDER — OXYCODONE-ACETAMINOPHEN 7.5-325 MG PO TABS: 1.0000 | ORAL_TABLET | Freq: Four times a day (QID) | ORAL | 0 refills | Status: DC | PRN

## 2019-10-04 NOTE — Telephone Encounter (Signed)
Oxycodone refill request. Please advise. 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

## 2019-10-07 ENCOUNTER — Encounter (HOSPITAL_BASED_OUTPATIENT_CLINIC_OR_DEPARTMENT_OTHER): Payer: No Typology Code available for payment source | Admitting: Internal Medicine

## 2019-10-14 ENCOUNTER — Ambulatory Visit: Payer: Self-pay | Admitting: Family Medicine

## 2019-10-14 ENCOUNTER — Encounter (HOSPITAL_BASED_OUTPATIENT_CLINIC_OR_DEPARTMENT_OTHER): Payer: No Typology Code available for payment source | Admitting: Internal Medicine

## 2019-10-18 ENCOUNTER — Other Ambulatory Visit: Payer: Self-pay | Admitting: Physical Medicine and Rehabilitation

## 2019-10-21 ENCOUNTER — Other Ambulatory Visit: Payer: Self-pay

## 2019-10-21 ENCOUNTER — Encounter (HOSPITAL_BASED_OUTPATIENT_CLINIC_OR_DEPARTMENT_OTHER): Payer: Medicaid Other | Attending: Internal Medicine | Admitting: Internal Medicine

## 2019-10-21 DIAGNOSIS — Z7901 Long term (current) use of anticoagulants: Secondary | ICD-10-CM | POA: Diagnosis not present

## 2019-10-21 DIAGNOSIS — G8221 Paraplegia, complete: Secondary | ICD-10-CM | POA: Insufficient documentation

## 2019-10-21 DIAGNOSIS — L89153 Pressure ulcer of sacral region, stage 3: Secondary | ICD-10-CM | POA: Diagnosis not present

## 2019-10-23 NOTE — Progress Notes (Signed)
ARVIE, BARTHOLOMEW ([id number]) Visit Report for 10/21/2019 Arrival Information Details Patient Name: Date of Service: [name] Ford YNE 10/21/2019 10:30 A M Medical Record Number: [id number] Patient Account Number: [id number] Date of Birth/Sex: Treating RN: Jan 08, 1986 (34 y.o. [name] Ford Primary Care Zeev Deakins: [name] Other Clinician: Referring Samyak Sackmann: Treating Kenn Rekowski/Extender: [name] in Treatment: 8 Visit Information History Since Last Visit Added or deleted any medications: No Patient Arrived: Ambulatory Any new allergies or adverse reactions: No Arrival Time: 11:23 Had a fall or experienced change in No Accompanied By: spouse activities of daily living that may affect Transfer Assistance: Transfer Board risk of falls: Patient Identification Verified: Yes Signs or symptoms of abuse/neglect since last visito No Secondary Verification Process Completed: Yes Hospitalized since last visit: No Patient Requires Transmission-Based Precautions: No Implantable device outside of the clinic excluding No Patient Has Alerts: Yes cellular tissue based products placed in the center Patient Alerts: Patient on Blood Thinner since last visit: Has Dressing in Place as Prescribed: Yes Pain Present Now: No Electronic Signature(s) Signed: 10/21/2019 6:00:28 PM By: Zenaida Deed RN, BSN Entered By: Zenaida Deed on 10/21/2019 11:32:39 -------------------------------------------------------------------------------- Clinic Level of Care Assessment Details Patient Name: Date of Service: [name] Ford YNE 10/21/2019 10:30 A M Medical Record Number: [id number] Patient Account Number: [id number] Date of Birth/Sex: Treating RN: [birth year] (34 y.o. [name] Primary Care Uriah Trueba: [name] Other Clinician: Referring Senaida Chilcote: Treating Aron Needles/Extender: [name] in Treatment: 8 Clinic Level of Care Assessment  Items TOOL 4 Quantity Score X- 1 0 Use when only an EandM is performed on FOLLOW-UP visit ASSESSMENTS - Nursing Assessment / Reassessment X- 1 10 Reassessment of Co-morbidities (includes updates in patient status) X- 1 5 Reassessment of Adherence to Treatment Plan ASSESSMENTS - Wound and Skin A ssessment / Reassessment []  - 0 Simple Wound Assessment / Reassessment - one wound X- 2 5 Complex Wound Assessment / Reassessment - multiple wounds []  - 0 Dermatologic / Skin Assessment (not related to wound area) ASSESSMENTS - Focused Assessment X- 1 5 Circumferential Edema Measurements - multi extremities []  - 0 Nutritional Assessment / Counseling / Intervention []  - 0 Lower Extremity Assessment (monofilament, tuning fork, pulses) []  - 0 Peripheral Arterial Disease Assessment (using hand held doppler) ASSESSMENTS - Ostomy and/or Continence Assessment and Care []  - 0 Incontinence Assessment and Management []  - 0 Ostomy Care Assessment and Management (repouching, etc.) PROCESS - Coordination of Care X - Simple Patient / Family Education for ongoing care 1 15 []  - 0 Complex (extensive) Patient / Family Education for ongoing care X- 1 10 Staff obtains , Records, T Results / Process Orders est []  - 0 Staff telephones HHA, Nursing Homes / Clarify orders / etc []  - 0 Routine Transfer to another Facility (non-emergent condition) []  - 0 Routine Hospital Admission (non-emergent condition) []  - 0 New Admissions / / Ordering NPWT Apligraf, etc. , []  - 0 Emergency Hospital Admission (emergent condition) X- 1 10 Simple Discharge Coordination []  - 0 Complex (extensive) Discharge Coordination PROCESS - Special Needs []  - 0 Pediatric / Minor Patient Management []  - 0 Isolation Patient Management []  - 0 Hearing / Language / Visual special needs []  - 0 Assessment of Community assistance (transportation, D/C planning, etc.) []  - 0 Additional  assistance / Altered mentation []  - 0 Support Surface(s) Assessment (bed, cushion, seat, etc.) INTERVENTIONS - Wound Cleansing / Measurement []  - 0 Simple Wound Cleansing - one wound X-  2 5 Complex Wound Cleansing - multiple wounds X- 1 5 Wound Imaging (photographs - any number of wounds) []  - 0 Wound Tracing (instead of photographs) []  - 0 Simple Wound Measurement - one wound X- 2 5 Complex Wound Measurement - multiple wounds INTERVENTIONS - Wound Dressings X - Small Wound Dressing one or multiple wounds 1 10 []  - 0 Medium Wound Dressing one or multiple wounds []  - 0 Large Wound Dressing one or multiple wounds X- 1 5 Application of Medications - topical []  - 0 Application of Medications - injection INTERVENTIONS - Miscellaneous []  - 0 External ear exam []  - 0 Specimen Collection (cultures, biopsies, blood, body fluids, etc.) []  - 0 Specimen(s) / Culture(s) sent or taken to Lab for analysis []  - 0 Patient Transfer (multiple staff / / Similar devices) []  - 0 Simple Staple / Suture removal (25 or less) []  - 0 Complex Staple / Suture removal (26 or more) []  - 0 Hypo / Hyperglycemic Management (close monitor of Blood Glucose) []  - 0 Ankle / Brachial Index (ABI) - do not check if billed separately X- 1 5 Vital Signs Has the patient been seen at the hospital within the last three years: Yes Total Score: 110 Level Of Care: New/Established - Level 3 Electronic Signature(s) Signed: 10/21/2019 5:56:33 PM By: Entered By: on 10/21/2019 12:35:24 -------------------------------------------------------------------------------- Encounter Discharge Information Details Patient Name: Date of Service: , YNE 10/21/2019 10:30 A M Medical Record Number: Patient Account Number: Nurse, adult Date of Birth/Sex: Treating RN: 1985/05/29 (34 y.o. Primary Care Nachum Derossett: Other  Clinician: Referring Imelda Dandridge: Treating Vida Nicol/Extender: 10/23/2019 in Treatment: 8 Encounter Discharge Information Items Discharge Condition: Stable Ambulatory Status: Wheelchair Discharge Destination: Home Transportation: Private Auto Accompanied By: spouse Schedule Follow-up Appointment: Yes Clinical Summary of Care: Patient Declined Electronic Signature(s) Signed: 10/21/2019 6:00:28 PM By: Cherylin Mylar RN, BSN Entered By: 10/23/2019 on 10/21/2019 12:35:39 -------------------------------------------------------------------------------- Lower Extremity Assessment Details Patient Name: Date of Service: [name] 10/21/2019 10:30 A M Medical Record Number: [id number] Patient Account Number: [id number] Date of Birth/Sex: Treating RN: 02/08/1986 (34 y.o. [name] Ford Primary Care Shavontae Gibeault: [name] Other Clinician: Referring Zahir Eisenhour: Treating Luby Seamans/Extender: [name] in Treatment: 8 Edema Assessment Assessed: [Left: No] [Right: No] Edema: [Left: N] [Right: o] Ankle Left: Right: Point of Measurement: cm From Medial Instep cm 21.4 cm Vascular Assessment Pulses: Dorsalis Pedis Palpable: [Right:Yes] Electronic Signature(s) Signed: 10/21/2019 6:00:28 PM By: Zenaida Deed RN, BSN Entered By: Zenaida Deed on 10/21/2019 11:34:40 -------------------------------------------------------------------------------- Multi Wound Chart Details Patient Name: Date of Service: [name] Ford, 10/23/2019 YNE 10/21/2019 10:30 A M Medical Record Number: [id number] Patient Account Number: 06/03/1985 Date of Birth/Sex: Treating RN: [birth year] (34 y.o. [name] Primary Care Issaac Shipper: [name] Other Clinician: Referring Raven Harmes: Treating Fianna Snowball/Extender: 10/23/2019 in Treatment: 8 Vital Signs Height(in): 72 Pulse(bpm): 86 Weight(lbs): 200 Blood Pressure(mmHg):  144/88 Body Mass Index(BMI): 27 Temperature(F): 98.2 Respiratory Rate(breaths/min): 18 Photos: [1:No Photos Right, Medial T Great oe] [2:No Photos Sacrum] [N/A:N/A N/A] Wound Location: [1:Trauma] [2:Pressure Injury] [N/A:N/A] Wounding Event: [1:Trauma, Other] [2:Pressure Ulcer] [N/A:N/A] Primary Etiology: [1:Paraplegia] [2:Paraplegia] [N/A:N/A] Comorbid History: [1:08/24/2019] [2:05/09/2019] [N/A:N/A] Date Acquired: [1:8] [2:8] [N/A:N/A] Weeks of Treatment: [1:Healed - Epithelialized] [2:Open] [N/A:N/A] Wound Status: [1:0x0x0] [2:3.6x2x1.2] [N/A:N/A] Measurements L x W x D (cm) [1:0] [2:5.655] [N/A:N/A] A (cm) : rea [1:0] [2:6.786] [N/A:N/A] Volume (cm) : [  1:100.00%] [2:64.70%] [N/A:N/A] % Reduction in A rea: [1:100.00%] [2:71.80%] [N/A:N/A] % Reduction in Volume: [2:3] Starting Position 1 (o'clock): [2:6] Ending Position 1 (o'clock): [2:1.4] Maximum Distance 1 (cm): [1:No] [2:Yes] [N/A:N/A] Undermining: [1:Full Thickness With Exposed Support Category/Stage III] [N/A:N/A] Classification: [1:Structures None Present] [2:Medium] [N/A:N/A] Exudate Amount: [1:N/A] [2:Serosanguineous] [N/A:N/A] Exudate Type: [1:N/A] [2:red, brown] [N/A:N/A] Exudate Color: [1:Flat and Intact] [2:Well defined, not attached] [N/A:N/A] Wound Margin: [1:None Present (0%)] [2:Large (67-100%)] [N/A:N/A] Granulation Amount: [1:N/A] [2:Red, Pink] [N/A:N/A] Granulation Quality: [1:None Present (0%)] [2:Small (1-33%)] [N/A:N/A] Necrotic Amount: [1:Fascia: No] [2:Fat Layer (Subcutaneous Tissue): Yes N/A] Exposed Structures: [1:Fat Layer (Subcutaneous Tissue): No Fascia: No Tendon: No Muscle: No Joint: No Bone: No Large (67-100%)] [2:Tendon: No Muscle: No Joint: No Bone: No Small (1-33%)] [N/A:N/A] Treatment Notes Electronic Signature(s) Signed: 10/21/2019 5:56:33 PM By: Cherylin Mylar Signed: 10/23/2019 7:39:18 AM By: Baltazar Najjar MD Entered By: Baltazar Najjar on 10/21/2019  12:30:33 -------------------------------------------------------------------------------- Multi-Disciplinary Care Plan Details Patient Name: Date of Service: Duanne Guess, [name] 10/21/2019 10:30 A M Medical Record Number: [id number] Patient Account Number: [id number] Date of Birth/Sex: Treating RN: 12-26-1985 (34 y.o. [name] Primary Care Charliene Inoue: [name] Other Clinician: Referring Nichollas Perusse: Treating Zoe Creasman/Extender: [name] in Treatment: 8 Active Inactive Nutrition Nursing Diagnoses: Potential for alteratiion in Nutrition/Potential for imbalanced nutrition Goals: Patient/caregiver agrees to and verbalizes understanding of need to use nutritional supplements and/or vitamins as prescribed Date Initiated: 08/24/2019 Target Resolution Date: 11/11/2019 Goal Status: Active Interventions: Assess patient nutrition upon admission and as needed per policy Treatment Activities: Patient referred to Primary Care Physician for further nutritional evaluation : 08/24/2019 Notes: Pressure Nursing Diagnoses: Knowledge deficit related to causes and risk factors for pressure ulcer development Knowledge deficit related to management of pressures ulcers Potential for impaired tissue integrity related to pressure, friction, moisture, and shear Goals: Patient will remain free from development of additional pressure ulcers Date Initiated: 08/24/2019 Target Resolution Date: 11/11/2019 Goal Status: Active Patient/caregiver will verbalize understanding of pressure ulcer management Date Initiated: 08/24/2019 Target Resolution Date: 11/11/2019 Goal Status: Active Interventions: Assess: immobility, friction, shearing, incontinence upon admission and as needed Assess offloading mechanisms upon admission and as needed Assess potential for pressure ulcer upon admission and as needed Notes: Wound/Skin Impairment Nursing Diagnoses: Impaired tissue  integrity Knowledge deficit related to ulceration/compromised skin integrity Goals: Patient will demonstrate a reduced rate of smoking or cessation of smoking Date Initiated: 08/24/2019 Target Resolution Date: 11/11/2019 Goal Status: Active Patient/caregiver will verbalize understanding of skin care regimen Date Initiated: 08/24/2019 Target Resolution Date: 11/11/2019 Goal Status: Active Ulcer/skin breakdown will have a volume reduction of 30% by week 4 Date Initiated: 08/24/2019 Target Resolution Date: 11/11/2019 Goal Status: Active Interventions: Assess patient/caregiver ability to obtain necessary supplies Assess patient/caregiver ability to perform ulcer/skin care regimen upon admission and as needed Provide education on ulcer and skin care Treatment Activities: Skin care regimen initiated : 08/24/2019 Topical wound management initiated : 08/24/2019 Notes: Electronic Signature(s) Signed: 10/21/2019 5:56:33 PM By: Cherylin Mylar Entered By: Cherylin Mylar on 10/21/2019 11:16:51 -------------------------------------------------------------------------------- Pain Assessment Details Patient Name: Date of Service: [name] Ford YNE 10/21/2019 10:30 A M Medical Record Number: [id number] Patient Account Number: [id number] Date of Birth/Sex: Treating RN: 01/21/1986 (34 y.o. [name] Ford Primary Care Jakarri Lesko: [name] Other Clinician: Referring Jonathen Rathman: Treating Ananda Caya/Extender: [name] in Treatment: 8 Active Problems Location of Pain Severity and Description of Pain Patient Has Paino No Site Locations Rate the pain. Current Pain Level:  0 Pain Management and Medication Current Pain Management: Electronic Signature(s) Signed: 10/21/2019 6:00:28 PM By: Zenaida DeedBoehlein, Linda RN, BSN Entered By: Zenaida DeedBoehlein, Linda on 10/21/2019 11:33:33 -------------------------------------------------------------------------------- Patient/Caregiver  Education Details Patient Name: Date of Service: Duanne GuessJO [place], [place] YNE 8/27/2021andnbsp10:30 A M Medical Record Number: [id number] Patient Account Number: [id number] Date of Birth/Gender: Treating RN: 11/06/1985 (34 y.o. Katherina RightM) Dwiggins, Shannon Primary Care Physician: Raliegh IpStroud, Natalie Other Clinician: Referring Physician: Treating Physician/Extender: Dorian Heckleobson, Michael Stroud, Natalie Weeks in Treatment: 8 Education Assessment Education Provided To: Patient Education Topics Provided Wound/Skin Impairment: Handouts: Caring for Your Ulcer Methods: Explain/Verbal Responses: State content correctly Electronic Signature(s) Signed: 10/21/2019 5:56:33 PM By: Cherylin Mylarwiggins, Shannon Entered By: Cherylin Mylarwiggins, Shannon on 10/21/2019 11:17:12 -------------------------------------------------------------------------------- Wound Assessment Details Patient Name: Date of Service: [name] GlatterJO HNSO N, [place] YNE 10/21/2019 10:30 A M Medical Record Number: [id number] Patient Account Number: [id number] Date of Birth/Sex: Treating RN: 03/31/1985 (34 y.o. [name] SchoonerM) Boehlein, Linda Primary Care Johnathon Olden: Raliegh IpStroud, Natalie Other Clinician: Referring Isma Tietje: Treating Lariya Kinzie/Extender: Dorian Heckleobson, Michael Stroud, Natalie Weeks in Treatment: 8 Wound Status Wound Number: 1 Primary Etiology: Trauma, Other Wound Location: Right, Medial T Great oe Wound Status: Healed - Epithelialized Wounding Event: Trauma Comorbid History: Paraplegia Date Acquired: 08/24/2019 Weeks Of Treatment: 8 Clustered Wound: No Wound Measurements Length: (cm) Width: (cm) Depth: (cm) Area: (cm) Volume: (cm) 0 % Reduction in Area: 100% 0 % Reduction in Volume: 100% 0 Epithelialization: Large (67-100%) 0 Tunneling: No 0 Undermining: No Wound Description Classification: Full Thickness With Exposed Support Structures Wound Margin: Flat and Intact Exudate Amount: None Present Foul Odor After Cleansing: No Slough/Fibrino No Wound Bed Granulation Amount: None  Present (0%) Exposed Structure Necrotic Amount: None Present (0%) Fascia Exposed: No Fat Layer (Subcutaneous Tissue) Exposed: No Tendon Exposed: No Muscle Exposed: No Joint Exposed: No Bone Exposed: No Electronic Signature(s) Signed: 10/21/2019 6:00:28 PM By: Zenaida DeedBoehlein, Linda RN, BSN Entered By: Zenaida DeedBoehlein, Linda on 10/21/2019 11:43:19 -------------------------------------------------------------------------------- Wound Assessment Details Patient Name: Date of Service: Duanne GuessJO [place]NS[place], [place] YNE 10/21/2019 10:30 A M Medical Record Number: [id number] Patient Account Number: [id number] Date of Birth/Sex: Treating RN: 06/02/1985 (34 y.o. [name] SchoonerM) Boehlein, Linda Primary Care Cassadee Vanzandt: Raliegh IpStroud, Natalie Other Clinician: Referring Gabryela Kimbrell: Treating Debera Sterba/Extender: Dorian Heckleobson, Michael Stroud, Natalie Weeks in Treatment: 8 Wound Status Wound Number: 2 Primary Etiology: Pressure Ulcer Wound Location: Sacrum Wound Status: Open Wounding Event: Pressure Injury Comorbid History: Paraplegia Date Acquired: 05/09/2019 Weeks Of Treatment: 8 Clustered Wound: No Wound Measurements Length: (cm) 3.6 Width: (cm) 2 Depth: (cm) 1.2 Area: (cm) 5.655 Volume: (cm) 6.786 % Reduction in Area: 64.7% % Reduction in Volume: 71.8% Epithelialization: Small (1-33%) Tunneling: No Undermining: Yes Starting Position (o'clock): 3 Ending Position (o'clock): 6 Maximum Distance: (cm) 1.4 Wound Description Classification: Category/Stage III Wound Margin: Well defined, not attached Exudate Amount: Medium Exudate Type: Serosanguineous Exudate Color: red, brown Foul Odor After Cleansing: No Slough/Fibrino Yes Wound Bed Granulation Amount: Large (67-100%) Exposed Structure Granulation Quality: Red, Pink Fascia Exposed: No Necrotic Amount: Small (1-33%) Fat Layer (Subcutaneous Tissue) Exposed: Yes Necrotic Quality: Adherent Slough Tendon Exposed: No Muscle Exposed: No Joint Exposed: No Bone Exposed: No Treatment  Notes Wound #2 (Sacrum) 2. Periwound Care Skin Prep 3. Primary Dressing Applied Calcium Alginate Ag 4. Secondary Dressing Foam Border Dressing Electronic Signature(s) Signed: 10/21/2019 6:00:28 PM By: Zenaida DeedBoehlein, Linda RN, BSN Entered By: Zenaida DeedBoehlein, Linda on 10/21/2019 11:44:10 -------------------------------------------------------------------------------- Vitals Details Patient Name: Date of Service: Duanne GuessJO HNS[place], [place] YNE 10/21/2019 10:30 A M Medical Record Number: [id number] Patient Account Number: [id number] Date of Birth/Sex: Treating  RN: 1985/06/18 (34 y.o. [name] Ford Primary Care Stephene Alegria: [name] Other Clinician: Referring Adron Geisel: Treating Yaneliz Radebaugh/Extender: [name] in Treatment: 8 Vital Signs Time Taken: 11:32 Temperature (F): 98.2 Height (in): 72 Pulse (bpm): 86 Source: Stated Respiratory Rate (breaths/min): 18 Weight (lbs): 200 Blood Pressure (mmHg): 144/88 Source: Stated Reference Range: 80 - 120 mg / dl Body Mass Index (BMI): 27.1 Electronic Signature(s) Signed: 10/21/2019 6:00:28 PM By: Zenaida Deed RN, BSN Entered By: Zenaida Deed on 10/21/2019 11:33:01
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
## 2019-11-01 ENCOUNTER — Ambulatory Visit: Payer: Self-pay | Admitting: Family Medicine

## 2019-11-11 ENCOUNTER — Encounter (HOSPITAL_BASED_OUTPATIENT_CLINIC_OR_DEPARTMENT_OTHER): Payer: Medicaid Other | Attending: Internal Medicine | Admitting: Internal Medicine

## 2019-11-11 ENCOUNTER — Other Ambulatory Visit: Payer: Self-pay

## 2019-11-11 DIAGNOSIS — L89153 Pressure ulcer of sacral region, stage 3: Secondary | ICD-10-CM | POA: Insufficient documentation

## 2019-11-11 DIAGNOSIS — Z7901 Long term (current) use of anticoagulants: Secondary | ICD-10-CM | POA: Diagnosis not present

## 2019-11-11 DIAGNOSIS — G8221 Paraplegia, complete: Secondary | ICD-10-CM | POA: Diagnosis not present
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
## 2019-11-14 NOTE — Progress Notes (Signed)
[name], [name] ([id number]) Visit Report for 11/11/2019 Arrival Information Details Patient Name: Date of Service: [name] Ford 11/11/2019 11:00 [name] Ford Record Number: [id number] Patient Account Number: [id number] Date of Birth/Sex: Treating RN: 1985/11/22 (34 y.o. [name] Primary Care [name]: [name] Other Clinician: Referring [name]: Treating [name]/Extender: [name] in Treatment: 11 Visit Information History Since Last Visit Added or deleted any medications: No Patient Arrived: Wheel Chair Any new allergies or adverse reactions: No Arrival Time: 11:39 Had a fall or experienced change in No Accompanied By: girlfriend activities of daily living that may affect Transfer Assistance: None risk of falls: Patient Identification Verified: Yes Signs or symptoms of abuse/neglect since last visito No Secondary Verification Process Completed: Yes Hospitalized since last visit: No Patient Requires Transmission-Based Precautions: No Implantable device outside of the clinic excluding No Patient Has Alerts: Yes cellular tissue based products placed in the center Patient Alerts: Patient on Blood Thinner since last visit: Has Dressing in Place as Prescribed: Yes Pain Present Now: Yes Electronic Signature(s) Signed: 11/14/2019 8:02:18 AM By: [name] Entered By: [name] on 11/11/2019 11:39:37 -------------------------------------------------------------------------------- Clinic Level of Care Assessment Details Patient Name: Date of Service: [name] Ford, [name] Ford Medical Record Number: [id number] Patient Account Number: [id number] Date of Birth/Sex: Treating RN: [birth year] (34 y.o. [name] Primary Care [name]: [name] Other Clinician: Referring [name]: Treating Ivan Lacher/Extender: [name] in Treatment: 11 Clinic Level of Care Assessment  Items TOOL 4 Quantity Score X- 1 0 Use when only an EandM is performed on FOLLOW-UP visit ASSESSMENTS - Nursing Assessment / Reassessment X- 1 10 Reassessment of Co-morbidities (includes updates in patient status) X- 1 5 Reassessment of Adherence to Treatment Plan ASSESSMENTS - Wound and Skin A ssessment / Reassessment X - Simple Wound Assessment / Reassessment - one wound 1 5 []  - 0 Complex Wound Assessment / Reassessment - multiple wounds []  - 0 Dermatologic / Skin Assessment (not related to wound area) ASSESSMENTS - Focused Assessment []  - 0 Circumferential Edema Measurements - multi extremities []  - 0 Nutritional Assessment / Counseling / Intervention []  - 0 Lower Extremity Assessment (monofilament, tuning fork, pulses) []  - 0 Peripheral Arterial Disease Assessment (using hand held doppler) ASSESSMENTS - Ostomy and/or Continence Assessment and Care []  - 0 Incontinence Assessment and Management []  - 0 Ostomy Care Assessment and Management (repouching, etc.) PROCESS - Coordination of Care X - Simple Patient / Family Education for ongoing care 1 15 []  - 0 Complex (extensive) Patient / Family Education for ongoing care X- 1 10 Staff obtains Programmer, systems, Records, T Results / Process Orders est []  - 0 Staff telephones HHA, Nursing Homes / Clarify orders / etc []  - 0 Routine Transfer to another Facility (non-emergent condition) []  - 0 Routine Hospital Admission (non-emergent condition) []  - 0 New Admissions / Biomedical engineer / Ordering NPWT Apligraf, etc. , []  - 0 Emergency Hospital Admission (emergent condition) X- 1 10 Simple Discharge Coordination []  - 0 Complex (extensive) Discharge Coordination PROCESS - Special Needs []  - 0 Pediatric / Minor Patient Management []  - 0 Isolation Patient Management []  - 0 Hearing / Language / Visual special needs []  - 0 Assessment of Community assistance (transportation, D/C planning, etc.) []  - 0 Additional  assistance / Altered mentation []  - 0 Support Surface(s) Assessment (bed, cushion, seat, etc.) INTERVENTIONS - Wound Cleansing / Measurement X - Simple Wound Cleansing - one wound 1 5 []  -  0 Complex Wound Cleansing - multiple wounds X- 1 5 Wound Imaging (photographs - any number of wounds) []  - 0 Wound Tracing (instead of photographs) X- 1 5 Simple Wound Measurement - one wound []  - 0 Complex Wound Measurement - multiple wounds INTERVENTIONS - Wound Dressings X - Small Wound Dressing one or multiple wounds 1 10 []  - 0 Medium Wound Dressing one or multiple wounds []  - 0 Large Wound Dressing one or multiple wounds X- 1 5 Application of Medications - topical []  - 0 Application of Medications - injection INTERVENTIONS - Miscellaneous []  - 0 External ear exam []  - 0 Specimen Collection (cultures, biopsies, blood, body fluids, etc.) []  - 0 Specimen(s) / Culture(s) sent or taken to Lab for analysis []  - 0 Patient Transfer (multiple staff / Civil Service fast streamer / Similar devices) []  - 0 Simple Staple / Suture removal (25 or less) []  - 0 Complex Staple / Suture removal (26 or more) []  - 0 Hypo / Hyperglycemic Management (close monitor of Blood Glucose) []  - 0 Ankle / Brachial Index (ABI) - do not check if billed separately X- 1 5 Vital Signs Has the patient been seen at the hospital within the last three years: Yes Total Score: 90 Level Of Care: New/Established - Level 3 Electronic Signature(s) Signed: 11/14/2019 1:33:30 PM By: [name] Entered By: [name] on 11/11/2019 12:21:22 -------------------------------------------------------------------------------- Encounter Discharge Information Details Patient Name: Date of Service: [name] Ford, [name] Ford 11/11/2019 11:00 [name] Ford Record Number: [id number] Patient Account Number: [id number] Date of Birth/Sex: Treating RN: 08/15/1985 (34 y.o. [name] Primary Care [name]: [name] Other  Clinician: Referring [name]: Treating [name]/Extender: [name] in Treatment: 11 Encounter Discharge Information Items Discharge Condition: Stable Ambulatory Status: Wheelchair Discharge Destination: Home Transportation: Private Auto Accompanied By: spouse Schedule Follow-up Appointment: Yes Clinical Summary of Care: Patient Declined Electronic Signature(s) Signed: 11/14/2019 5:09:36 PM By: Levan Hurst RN, BSN Entered By: Levan Hurst on 11/11/2019 15:05:12 -------------------------------------------------------------------------------- Multi Wound Chart Details Patient Name: Date of Service: [name] Ford, [place] 11/11/2019 11:00 [place] Record Number: [id number] Patient Account Number: [id number] Date of Birth/Sex: Treating RN: [birth year] (34 y.o. [name] Primary Care Amayiah Gosnell: [name] Other Clinician: Referring Milt Coye: Treating Heyden Jaber/Extender: [name] in Treatment: 11 Vital Signs Height(in): 72 Pulse(bpm): 81 Weight(lbs): 200 Blood Pressure(mmHg): 123/79 Body Mass Index(BMI): 27 Temperature(F): 97.6 Respiratory Rate(breaths/min): 18 Photos: [2:No [place] [N/A:N/A N/A] Wound Location: [2:Pressure Injury] [N/A:N/A] Wounding Event: [2:Pressure Ulcer] [N/A:N/A] Primary Etiology: [2:Paraplegia] [N/A:N/A] Comorbid History: [2:05/09/2019] [N/A:N/A] Date Acquired: [2:11] [N/A:N/A] Weeks of Treatment: [2:Open] [N/A:N/A] Wound Status: [2:3.4x2x1.7] [N/A:N/A] Measurements L x W x D (cm) [2:5.341] [N/A:N/A] A (cm) : rea [2:9.079] [N/A:N/A] Volume (cm) : [2:66.70%] [N/A:N/A] % Reduction in A rea: [2:62.20%] [N/A:N/A] % Reduction in Volume: [2:3] Starting Position 1 (o'clock): [2:5] Ending Position 1 (o'clock): [2:1.5] Maximum Distance 1 (cm): [2:Yes] [N/A:N/A] Undermining: [2:Category/Stage III] [N/A:N/A] Classification: [2:Medium] [N/A:N/A] Exudate A mount:  [2:Serosanguineous] [N/A:N/A] Exudate Type: [2:red, brown] [N/A:N/A] Exudate Color: [2:Well defined, not attached] [N/A:N/A] Wound Margin: [2:Large (67-100%)] [N/A:N/A] Granulation A mount: [2:Red, Pink] [N/A:N/A] Granulation Quality: [2:None Present (0%)] [N/A:N/A] Necrotic A mount: [2:Fat Layer (Subcutaneous Tissue): Yes N/A] Exposed Structures: [2:Fascia: No Tendon: No Muscle: No Joint: No Bone: No Small (1-33%)] [N/A:N/A] Treatment Notes Electronic Signature(s) Signed: 11/14/2019 8:08:43 AM By: Linton Ham MD Signed: 11/14/2019 1:33:30 PM By: [name] Entered By: Linton Ham on 11/11/2019 12:58:38 -------------------------------------------------------------------------------- Multi-Disciplinary Care Plan Details Patient  Name: Date of Service: [name] Ford, [name] Ford 11/11/2019 11:00 A Ford Medical Record Number: [id number] Patient Account Number: [id number] Date of Birth/Sex: Treating RN: Feb 27, 1985 (34 y.o. [name] Primary Care Camera Krienke: [name] Other Clinician: Referring Almedia Cordell: Treating Dari Carpenito/Extender: [name] in Treatment: 11 Active Inactive Nutrition Nursing Diagnoses: Potential for alteratiion in Nutrition/Potential for imbalanced nutrition Goals: Patient/caregiver agrees to and verbalizes understanding of need to use nutritional supplements and/or vitamins as prescribed Date Initiated: 08/24/2019 Target Resolution Date: 12/02/2019 Goal Status: Active Interventions: Assess patient nutrition upon admission and as needed per policy Treatment Activities: Patient referred to Primary Care Physician for further nutritional evaluation : 08/24/2019 Notes: Pressure Nursing Diagnoses: Knowledge deficit related to causes and risk factors for pressure ulcer development Knowledge deficit related to management of pressures ulcers Potential for impaired tissue integrity related to pressure, friction, moisture, and  shear Goals: Patient will remain free from development of additional pressure ulcers Date Initiated: 08/24/2019 Target Resolution Date: 12/02/2019 Goal Status: Active Patient/caregiver will verbalize understanding of pressure ulcer management Date Initiated: 08/24/2019 Target Resolution Date: 12/02/2019 Goal Status: Active Interventions: Assess: immobility, friction, shearing, incontinence upon admission and as needed Assess offloading mechanisms upon admission and as needed Assess potential for pressure ulcer upon admission and as needed Notes: Wound/Skin Impairment Nursing Diagnoses: Impaired tissue integrity Knowledge deficit related to ulceration/compromised skin integrity Goals: Patient will demonstrate a reduced rate of smoking or cessation of smoking Date Initiated: 08/24/2019 Date Inactivated: 11/11/2019 Target Resolution Date: 11/11/2019 Goal Status: Met Patient/caregiver will verbalize understanding of skin care regimen Date Initiated: 08/24/2019 Target Resolution Date: 11/11/2019 Goal Status: Active Ulcer/skin breakdown will have a volume reduction of 30% by week 4 Date Initiated: 08/24/2019 Date Inactivated: 11/11/2019 Target Resolution Date: 11/11/2019 Goal Status: Unmet Unmet Reason: chronic wound Interventions: Assess patient/caregiver ability to obtain necessary supplies Assess patient/caregiver ability to perform ulcer/skin care regimen upon admission and as needed Provide education on ulcer and skin care Treatment Activities: Skin care regimen initiated : 08/24/2019 Topical wound management initiated : 08/24/2019 Notes: Electronic Signature(s) Signed: 11/14/2019 1:33:30 PM By: [name] Entered By: [name] on 11/11/2019 12:20:00 -------------------------------------------------------------------------------- Pain Assessment Details Patient Name: Date of Service: [place], [place] 11/11/2019 11:00 A Ford Medical Record Number: [id number] Patient  Account Number: [id number] Date of Birth/Sex: Treating RN: 03-12-1985 (34 y.o. [name] Primary Care Jerron Niblack: [name] Other Clinician: Referring Kemiah Booz: Treating Lavance Beazer/Extender: [name] in Treatment: 11 Active Problems Location of Pain Severity and Description of Pain Patient Has Paino Yes Site Locations Rate the pain. Rate the pain. Current Pain Level: 7 Pain Management and Medication Current Pain Management: Electronic Signature(s) Signed: 11/14/2019 8:02:18 AM By: [name] Signed: 11/14/2019 1:33:30 PM By: [name] Entered By: [name] on 11/11/2019 11:40:02 -------------------------------------------------------------------------------- Patient/Caregiver Education Details Patient Name: Date of Service: [name] Ford, [place] 9/17/2021andnbsp11:00 [place] Record Number: [id number] Patient Account Number: [id number] Date of Birth/Gender: Treating RN: [birth year] (34 y.o. [name] Primary Care Physician: [name] Other Clinician: Referring Physician: Treating Physician/Extender: [name] in Treatment: 11 Education Assessment Education Provided To: Patient Education Topics Provided Wound/Skin Impairment: Handouts: Caring for Your Ulcer Methods: Explain/Verbal Responses: State content correctly Electronic Signature(s) Signed: 11/14/2019 1:33:30 PM By: [name] Entered By: [name] on 11/11/2019 12:20:18 -------------------------------------------------------------------------------- Wound Assessment Details Patient Name: Date of Service: [name] Ford 11/11/2019 11:00 [place] Record Number: [id number] Patient Account Number: [id number]  Date of Birth/Sex: Treating RN: 1985/11/05 (34 y.o. [name] Primary Care Rylynne Schicker: [name] Other Clinician: Referring Martine Bleecker: Treating Otto Caraway/Extender: [name] in Treatment: 11 Wound Status Wound Number: 2 Primary Etiology: Pressure Ulcer Wound Location: Sacrum Wound Status: Open Wounding Event: Pressure Injury Comorbid History: Paraplegia Date Acquired: 05/09/2019 Weeks Of Treatment: 11 Clustered Wound: No Wound Measurements Length: (cm) 3.4 Width: (cm) 2 Depth: (cm) 1.7 Area: (cm) 5.341 Volume: (cm) 9.079 % Reduction in Area: 66.7% % Reduction in Volume: 62.2% Epithelialization: Small (1-33%) Tunneling: No Undermining: Yes Starting Position (o'clock): 3 Ending Position (o'clock): 5 Maximum Distance: (cm) 1.5 Wound Description Classification: Category/Stage III Wound Margin: Well defined, not attached Exudate Amount: Medium Exudate Type: Serosanguineous Exudate Color: red, brown Foul Odor After Cleansing: No Slough/Fibrino Yes Wound Bed Granulation Amount: Large (67-100%) Exposed Structure Granulation Quality: Red, Pink Fascia Exposed: No Necrotic Amount: None Present (0%) Fat Layer (Subcutaneous Tissue) Exposed: Yes Tendon Exposed: No Muscle Exposed: No Joint Exposed: No Bone Exposed: No Treatment Notes Wound #2 (Sacrum) 1. Cleanse With Wound Cleanser 3. Primary Dressing Applied Collegen AG 4. Secondary Dressing Foam Border Dressing Electronic Signature(s) Signed: 11/14/2019 1:33:30 PM By: [name] Signed: 11/14/2019 5:09:36 PM By: Levan Hurst RN, BSN Entered By: Levan Hurst on 11/11/2019 11:57:10 -------------------------------------------------------------------------------- Vitals Details Patient Name: Date of Service: [name] Ford, [place] 11/11/2019 11:00 [place] Record Number: [id number] Patient Account Number: [id number] Date of Birth/Sex: Treating RN: 1985-06-09 (34 y.o. [name] Primary Care Fransheska Willingham: [name] Other Clinician: Referring Cassady Turano: Treating Jenai Scaletta/Extender: [name] in Treatment: 11 Vital  Signs Time Taken: 11:39 Temperature (F): 97.6 Height (in): 72 Pulse (bpm): 81 Weight (lbs): 200 Respiratory Rate (breaths/min): 18 Body Mass Index (BMI): 27.1 Blood Pressure (mmHg): 123/79 Reference Range: 80 - 120 mg / dl Electronic Signature(s) Signed: 11/14/2019 8:02:18 AM By: [name] Entered By: [name] on 11/11/2019 11:39:51

## 2019-11-17 ENCOUNTER — Other Ambulatory Visit: Payer: Self-pay

## 2019-11-17 MED ORDER — OXYCODONE-ACETAMINOPHEN 7.5-325 MG PO TABS: 1.0000 | ORAL_TABLET | Freq: Four times a day (QID) | ORAL | 0 refills | Status: DC | PRN

## 2019-11-19 ENCOUNTER — Other Ambulatory Visit: Payer: Self-pay

## 2019-11-21 ENCOUNTER — Other Ambulatory Visit: Payer: Self-pay

## 2019-11-21 MED ORDER — OXYCODONE-ACETAMINOPHEN 7.5-325 MG PO TABS: 1.0000 | ORAL_TABLET | Freq: Four times a day (QID) | ORAL | 0 refills | Status: DC | PRN

## 2019-11-21 NOTE — Telephone Encounter (Signed)
Patient's last visit was back in April 2021

## 2019-11-24 ENCOUNTER — Telehealth: Payer: Self-pay | Admitting: *Deleted

## 2019-11-24 NOTE — Telephone Encounter (Signed)
Contacted patients wife and noified that Dr. Berline Chough is out on PAL until Monday October 4th. Let her know MyChart message has been forwarded to her inbox and the clinic manager, Oretha Milch, inbox

## 2019-12-02 ENCOUNTER — Encounter (HOSPITAL_BASED_OUTPATIENT_CLINIC_OR_DEPARTMENT_OTHER): Payer: No Typology Code available for payment source | Admitting: Internal Medicine

## 2019-12-09 ENCOUNTER — Encounter (HOSPITAL_BASED_OUTPATIENT_CLINIC_OR_DEPARTMENT_OTHER): Payer: No Typology Code available for payment source | Attending: Internal Medicine | Admitting: Internal Medicine

## 2019-12-12 ENCOUNTER — Other Ambulatory Visit: Payer: Self-pay | Admitting: Physical Medicine and Rehabilitation

## 2019-12-23 ENCOUNTER — Encounter: Payer: Self-pay | Admitting: Family Medicine

## 2019-12-26 ENCOUNTER — Other Ambulatory Visit: Payer: Self-pay

## 2019-12-26 ENCOUNTER — Encounter
Payer: Medicaid Other | Attending: Physical Medicine and Rehabilitation | Admitting: Physical Medicine and Rehabilitation

## 2019-12-26 ENCOUNTER — Encounter: Payer: Self-pay | Admitting: Physical Medicine and Rehabilitation

## 2019-12-26 VITALS — BP 126/77 | HR 75 | Temp 97.9°F

## 2019-12-26 DIAGNOSIS — Z79891 Long term (current) use of opiate analgesic: Secondary | ICD-10-CM | POA: Insufficient documentation

## 2019-12-26 DIAGNOSIS — Z993 Dependence on wheelchair: Secondary | ICD-10-CM | POA: Insufficient documentation

## 2019-12-26 DIAGNOSIS — N521 Erectile dysfunction due to diseases classified elsewhere: Secondary | ICD-10-CM | POA: Insufficient documentation

## 2019-12-26 DIAGNOSIS — N319 Neuromuscular dysfunction of bladder, unspecified: Secondary | ICD-10-CM | POA: Insufficient documentation

## 2019-12-26 DIAGNOSIS — Z5181 Encounter for therapeutic drug level monitoring: Secondary | ICD-10-CM | POA: Diagnosis present

## 2019-12-26 DIAGNOSIS — S31000S Unspecified open wound of lower back and pelvis without penetration into retroperitoneum, sequela: Secondary | ICD-10-CM | POA: Insufficient documentation

## 2019-12-26 DIAGNOSIS — G8220 Paraplegia, unspecified: Secondary | ICD-10-CM | POA: Diagnosis not present

## 2019-12-26 DIAGNOSIS — G8921 Chronic pain due to trauma: Secondary | ICD-10-CM | POA: Insufficient documentation

## 2019-12-26 MED ORDER — OXYBUTYNIN CHLORIDE ER 10 MG PO TB24: 10.0000 mg | ORAL_TABLET | Freq: Every day | ORAL | 5 refills | Status: DC
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
## 2019-12-26 NOTE — Patient Instructions (Signed)
Patient is a 34 yr old male with hx of T3/4 paraplegia Neurogenic bowel and bladder, and spasticity as well as pressure ulcer-   1. Will call Barbara Cower- to get w/c adjusted. LM.   2.  Get handicapped placard-  Today- suggest 2 placards.   3.  Described colonization AND spasticity of the bladder- sounds like what's going on, not UTI.  Won't check U/A and Cx at this time.  - will send to Dr Lorin Picket  MacDiarmid  4. Will try Oxybutynin 10 mg extended released nightly- for bladder function.   5. Needs to drink more water. Urine too dark.   6. Wrote and filled out paperwork for CAP worker- still agree with needs.   7. Outpatient PT- ordered to increase independence.   8. Needs oral drug screen for pain meds.   9. Retrograde ejaculation- can occur during orgasm , or not- they are NOT connected anymore.   10. Suggest the ring- silicone ring-  So don't need Viagra!  11. And discuss Sex with Urology!   12. F/U in 3 months- double appointment

## 2019-12-26 NOTE — Addendum Note (Signed)
Addended by: Silas [place] T on: 12/26/2019 12:03 PM   Modules accepted: Orders

## 2019-12-29 ENCOUNTER — Other Ambulatory Visit: Payer: Self-pay | Admitting: Family Medicine

## 2019-12-29 DIAGNOSIS — N3000 Acute cystitis without hematuria: Secondary | ICD-10-CM

## 2019-12-29 MED ORDER — SULFAMETHOXAZOLE-TRIMETHOPRIM 800-160 MG PO TABS: 1.0000 | ORAL_TABLET | Freq: Two times a day (BID) | ORAL | 0 refills | Status: DC

## 2019-12-30 ENCOUNTER — Ambulatory Visit: Payer: Medicaid Other | Admitting: Family Medicine

## 2019-12-30 LAB — DRUG TOX MONITOR 1 W/CONF, ORAL FLD
Amphetamines: NEGATIVE ng/mL (ref <10)
Barbiturates: NEGATIVE ng/mL (ref <10)
Benzodiazepines: NEGATIVE ng/mL (ref <0.50)
Benzoylecgonine: NEGATIVE ng/mL (ref <5.0)
Buprenorphine: NEGATIVE ng/mL (ref <0.10)
Cocaine: 15.8 ng/mL — ABNORMAL HIGH (ref <5.0)
Cocaine: POSITIVE ng/mL — AB (ref <5.0)
Codeine: NEGATIVE ng/mL (ref <2.5)
Cotinine: >250 ng/mL — ABNORMAL HIGH (ref <5.0)
Dihydrocodeine: NEGATIVE ng/mL (ref <2.5)
Fentanyl: NEGATIVE ng/mL (ref <0.10)
Heroin Metabolite: NEGATIVE ng/mL (ref <1.0)
Hydrocodone: NEGATIVE ng/mL (ref <2.5)
Hydromorphone: NEGATIVE ng/mL (ref <2.5)
MARIJUANA: POSITIVE ng/mL — AB (ref <2.5)
MDMA: NEGATIVE ng/mL (ref <10)
Meprobamate: NEGATIVE ng/mL (ref <2.5)
Methadone: NEGATIVE ng/mL (ref <5.0)
Morphine: NEGATIVE ng/mL (ref <2.5)
Nicotine Metabolite: POSITIVE ng/mL — AB (ref <5.0)
Norhydrocodone: NEGATIVE ng/mL (ref <2.5)
Noroxycodone: 48.8 ng/mL — ABNORMAL HIGH (ref <2.5)
Opiates: POSITIVE ng/mL — AB (ref <2.5)
Oxycodone: 192.4 ng/mL — ABNORMAL HIGH (ref <2.5)
Oxymorphone: NEGATIVE ng/mL (ref <2.5)
Phencyclidine: NEGATIVE ng/mL (ref <10)
THC: 92.7 ng/mL — ABNORMAL HIGH (ref <2.5)
Tapentadol: NEGATIVE ng/mL (ref <5.0)
Tramadol: NEGATIVE ng/mL (ref <5.0)
Zolpidem: NEGATIVE ng/mL (ref <5.0)

## 2019-12-30 LAB — DRUG TOX ALC METAB W/CON, ORAL FLD: Alcohol Metabolite: NEGATIVE ng/mL (ref <25)

## 2020-01-06 ENCOUNTER — Telehealth: Payer: Self-pay | Admitting: *Deleted

## 2020-01-06 NOTE — Telephone Encounter (Signed)
Oral drug screen swab is positive for cocaine and marijuana as well as his prescribed oxycodone.

## 2020-01-12 ENCOUNTER — Other Ambulatory Visit: Payer: Self-pay

## 2020-01-12 ENCOUNTER — Encounter (HOSPITAL_BASED_OUTPATIENT_CLINIC_OR_DEPARTMENT_OTHER): Payer: Medicaid Other | Attending: Internal Medicine | Admitting: Internal Medicine

## 2020-01-12 DIAGNOSIS — G8221 Paraplegia, complete: Secondary | ICD-10-CM | POA: Diagnosis not present

## 2020-01-12 DIAGNOSIS — Z993 Dependence on wheelchair: Secondary | ICD-10-CM | POA: Insufficient documentation

## 2020-01-12 DIAGNOSIS — L89153 Pressure ulcer of sacral region, stage 3: Secondary | ICD-10-CM | POA: Diagnosis not present

## 2020-01-12 NOTE — Progress Notes (Signed)
[name] Ford, [name] Ford ([id number]) Visit Report for 01/12/2020 Abuse/Suicide Risk Screen Details Patient Name: Date of Service: [name] Ford YNE 01/12/2020 10:30 A M Medical Record Number: [id number] Patient Account Number: [id number] Date of Birth/Sex: Treating RN: [birth year] (34 y.o. [name] Ford) [name] Ford Primary Care [name] Ford: [name] Ford [name] Ford: Referring [name] Ford: Treating [name] Ford: [name] Ford: 0 Abuse/Suicide Risk Screen Items Answer ABUSE RISK SCREEN: Has anyone close to you tried to hurt or harm you recentlyo No Do you feel uncomfortable with anyone in your familyo No Has anyone forced you do things that you didnt want to doo No Electronic Signature(s) Signed: 01/12/2020 5:30:22 PM By: [name] Pax RN Entered By: [name] Ford on 01/12/2020 11:23:17 -------------------------------------------------------------------------------- Activities of Daily Living Details Patient Name: Date of Service: [name] Ford [name] Ford 01/12/2020 10:30 A M Medical Record Number: [id number] Patient Account Number: [id number] Date of Birth/Sex: Treating RN: [birth year] (34 y.o. [name] Ford) [name] Ford Primary Care [name] Ford: [name] Ford [name] Ford: Referring [name] Ford: Treating [name] Ford: [name] Ford: 0 Activities of Daily Living Items Answer Activities of Daily Living (Please select one for each item) Drive Automobile Completely Able T Medications ake Completely Able Use T elephone Completely Able Care for Appearance Need Assistance Use T oilet Need Assistance Bath / Shower Need Assistance Dress Self Need Assistance Feed Self Completely Able Walk Not Able Get In / Out Bed Need Assistance Housework Need Assistance Prepare Meals Need Assistance Handle Money Need Assistance Shop for Self Completely Able Electronic Signature(s) Signed: 01/12/2020 5:30:22 PM By: [name] Pax RN Entered By: [name] Ford on 01/12/2020 11:24:23 -------------------------------------------------------------------------------- Education Screening Details Patient Name: Date of Service: [name] Ford, [name] Ford YNE 01/12/2020 10:30 A M Medical Record Number: [id number] Patient Account Number: [id number] Date of Birth/Sex: Treating RN: [name] Ford (34 y.o. [name] Primary Care [name] Ford: [name] Ford [name] Ford: Referring [name] Ford: Treating [name] Ford/Extender: [name] Ford: 0 Primary Learner Assessed: Patient Learning Preferences/Education Level/Primary Language Learning Preference: Explanation Highest Education Level: High School Preferred Language: English Cognitive Barrier Language Barrier: No Translator Needed: No Memory Deficit: No Emotional Barrier: No Cultural/Religious Beliefs Affecting Medical Care: No Physical Barrier Impaired Vision: No Impaired Hearing: No Decreased Hand dexterity: No Knowledge/Comprehension Knowledge Level: Medium Comprehension Level: High Ability to understand written instructions: High Ability to understand verbal instructions: High Motivation Anxiety Level: Anxious Cooperation: Cooperative Education Importance: Acknowledges Need Interest in Health Problems: Asks Questions Perception: Coherent Willingness to Engage in Self-Management High Activities: Readiness to Engage in Self-Management High Activities: Electronic Signature(s) Signed: 01/12/2020 5:30:22 PM By: [name] Pax RN Entered By: [name] Ford on 01/12/2020 11:25:35 -------------------------------------------------------------------------------- Fall Risk Assessment Details Patient Name: Date of Service: [name] Ford, [name] Ford YNE 01/12/2020 10:30 A M Medical Record Number: [id number] Patient Account Number: [id number] Date of Birth/Sex: Treating RN: [birth year] (34 y.o. [name] Ford) [name] Ford Primary Care [name] Ford: [name] Ford [name] Ford: Referring  [name] Ford: Treating [name] Ford/Extender: [name] Ford: 0 Fall Risk Assessment Items Have you had 2 or more falls in the last 12 monthso 0 No Have you had any fall that resulted in injury in the last 12 monthso 0 No FALLS RISK SCREEN History of falling - immediate or within 3 months 0 No Secondary diagnosis (Do you have 2 or more medical diagnoseso) 0 No Ambulatory aid None/bed rest/wheelchair/nurse 0 No Crutches/cane/walker 0 No Furniture 0 No Intravenous therapy Access/Saline/Heparin Lock 0 No Gait/Transferring Normal/ bed rest/ wheelchair 0 No Weak (short steps  with or without shuffle, stooped but able to lift head while walking, may seek 0 No support from furniture) Impaired (short steps with shuffle, may have difficulty arising from chair, head down, impaired 0 No balance) Mental Status Oriented to own ability 0 No Electronic Signature(s) Signed: 01/12/2020 5:30:22 PM By: [name] Pax RN Entered By: [name] Ford on 01/12/2020 11:25:44 -------------------------------------------------------------------------------- Nutrition Risk Screening Details Patient Name: Date of Service: [name] Ford YNE 01/12/2020 10:30 A M Medical Record Number: [id number] Patient Account Number: [id number] Date of Birth/Sex: Treating RN: [name] Ford (34 y.o. [name] Ford) [name] Ford Primary Care [name] Ford: [name] Ford [name] Ford: Referring [name] Ford: Treating [name] Ford: [name] Ford: 0 Height (in): 72 Weight (lbs): 156 Body Mass Index (BMI): 21.2 Nutrition Risk Screening Items Score Screening NUTRITION RISK SCREEN: I have an illness or condition that made me change the kind and/or amount of food I eat 0 No I eat fewer than two meals per day 0 No I eat few fruits and vegetables, or milk products 0 No I have three or more drinks of beer, liquor or wine almost every day 0 No I have tooth or mouth problems that make  it hard for me to eat 0 No I don't always have enough money to buy the food I need 0 No I eat alone most of the time 0 No I take three or more different prescribed or over-the-counter drugs a day 1 Yes Without wanting to, I have lost or gained 10 pounds in the last six months 0 No I am not always physically able to shop, cook and/or feed myself 2 Yes Nutrition Protocols Good Risk Protocol Moderate Risk Protocol 0 Provide education on nutrition High Risk Proctocol Risk Level: Moderate Risk Score: 3 Electronic Signature(s) Signed: 01/12/2020 5:30:22 PM By: [name] Pax RN Entered By: [name] Ford on 01/12/2020 11:26:00
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
## 2020-01-13 NOTE — Progress Notes (Addendum)
[name] Ford ([id number]) Visit Report for 01/12/2020 Allergy List Details Patient Name: Date of Service: [name] Ford [name] Ford 01/12/2020 10:30 A M Medical Record Number: [id number] Patient Account Number: [id number] Date of Birth/Sex: Treating Ford: 22-Nov-1985 (34 y.o. [name] Ford) [name] Ford Primary Care Terrea Bruster: [name] Ford Other Clinician: Referring [name] Ford: Treating [name] Ford: [name] Ford Weeks in Treatment: 0 Allergies Active Allergies No Known Drug Allergies Allergy Notes Electronic Signature(s) Signed: 01/12/2020 5:30:22 PM By: [name] Ford Entered By: [name] Ford on 01/12/2020 11:17:22 -------------------------------------------------------------------------------- Arrival Information Details Patient Name: Date of Service: [name] Ford, [name] Ford [name] Ford 01/12/2020 10:30 A M Medical Record Number: [id number] Patient Account Number: [id number] Date of Birth/Sex: Treating Ford: 1985/11/30 (34 y.o. [name] Ford) [name] Ford Primary Care Kemonte Ullman: [name] Ford Other Clinician: Referring Cera Rorke: Treating [name] Ford: [name] Ford in Treatment: 0 Visit Information Patient Arrived: Wheel Chair Arrival Time: 11:15 Accompanied By: self Transfer Assistance: None Patient Identification Verified: Yes Secondary Verification Process Completed: Yes Patient Requires Transmission-Based Precautions: No Patient Has Alerts: No History Since Last Visit All ordered tests and consults were completed: No Added or deleted any medications: No Any new allergies or adverse reactions: No Had a fall or experienced change in activities of daily living that may affect risk of falls: No Signs or symptoms of abuse/neglect since last visito No Hospitalized since last visit: No Implantable device outside of the clinic excluding cellular tissue based products placed in the center since last visit: No Has Dressing in Place as Prescribed: Yes Electronic  Signature(s) Signed: 01/12/2020 5:30:22 PM By: [name] Ford Entered By: [name] Ford on 01/12/2020 11:16:21 -------------------------------------------------------------------------------- Clinic Level of Care Assessment Details Patient Name: Date of Service: [name] Ford [name] Ford 01/12/2020 10:30 A M Medical Record Number: [id number] Patient Account Number: [id number] Date of Birth/Sex: Treating Ford: [birth year] (34 y.o. [name] Ford Primary Care Amry Cathy: [name] Ford Other Clinician: Referring [name] Ford: Treating [name] Ford: [name] Ford in Treatment: 0 Clinic Level of Care Assessment Items TOOL 1 Quantity Score X- 1 0 Use when EandM and Procedure is performed on INITIAL visit ASSESSMENTS - Nursing Assessment / Reassessment X- 1 20 General Physical Exam (combine w/ comprehensive assessment (listed just below) when performed on new pt. evals) X- 1 25 Comprehensive Assessment (HX, ROS, Risk Assessments, Wounds Hx, etc.) ASSESSMENTS - Wound and Skin Assessment / Reassessment X- 1 10 Dermatologic / Skin Assessment (not related to wound area) ASSESSMENTS - Ostomy and/or Continence Assessment and Care []  - 0 Incontinence Assessment and Management []  - 0 Ostomy Care Assessment and Management (repouching, etc.) PROCESS - Coordination of Care X - Simple Patient / Family Education for ongoing care 1 15 []  - 0 Complex (extensive) Patient / Family Education for ongoing care X- 1 10 Staff obtains , Records, T Results / Process Orders est []  - 0 Staff telephones HHA, Nursing Homes / Clarify orders / etc []  - 0 Routine Transfer to another Facility (non-emergent condition) []  - 0 Routine Hospital Admission (non-emergent condition) X- 1 15 New Admissions / / Ordering NPWT Apligraf, etc. , []  - 0 Emergency Hospital Admission (emergent condition) PROCESS - Special Needs []  - 0 Pediatric / Minor Patient Management []   - 0 Isolation Patient Management []  - 0 Hearing / Language / Visual special needs []  - 0 Assessment of Community assistance (transportation, D/C planning, etc.) []  - 0 Additional assistance / Altered mentation []  - 0 Support Surface(s) Assessment (bed, cushion, seat, etc.) INTERVENTIONS - Miscellaneous []  -  0 External ear exam []  - 0 Patient Transfer (multiple staff / / Similar devices) []  - 0 Simple Staple / Suture removal (25 or less) []  - 0 Complex Staple / Suture removal (26 or more) []  - 0 Hypo/Hyperglycemic Management (do not check if billed separately) []  - 0 Ankle / Brachial Index (ABI) - do not check if billed separately Has the patient been seen at the hospital within the last three years: Yes Total Score: 95 Level Of Care: New/Established - Level 3 Electronic Signature(s) Signed: 01/12/2020 5:38:55 PM By: Signed: 01/12/2020 5:38:55 PM By: Entered By: on 01/12/2020 12:24:27 -------------------------------------------------------------------------------- Encounter Discharge Information Details Patient Name: Date of Service: [name] Ford, 01/14/2020 [name] Ford 01/12/2020 10:30 A M Medical Record Number: [name] Ford Patient Account Number: 01/14/2020 Date of Birth/Sex: Treating Ford: 11/22/1985 (34 y.o. 01/14/2020 Primary Care Alyaan Budzynski: [id number] Other Clinician: Referring Eliese Kerwood: Treating Alexxis Mackert/Extender: [id number] in Treatment: 0 Encounter Discharge Information Items Post Procedure Vitals Discharge Condition: Stable Temperature (F): 98.3 Ambulatory Status: Wheelchair Pulse (bpm): 82 Discharge Destination: Home Respiratory Rate (breaths/min): 18 Transportation: Private Auto Blood Pressure (mmHg): 116/72 Accompanied By: self Schedule Follow-up Appointment: Yes Clinical Summary of Care: Patient Declined Electronic Signature(s) Signed: 01/13/2020 12:02:47 PM By: 32  Ford Entered By: [name] Ford on 01/12/2020 12:13:26 -------------------------------------------------------------------------------- Multi Wound Chart Details Patient Name: Date of Service: [name] Ford, 01/15/2020 [name] Ford 01/12/2020 10:30 A M Medical Record Number: Fonnie Mu Patient Account Number: 01/14/2020 Date of Birth/Sex: Treating Ford: October 10, 1985 (34 y.o. 01/14/2020 Primary Care Jeane Cashatt: [id number] Other Clinician: Referring Jessaca Philippi: Treating Naima Veldhuizen/Extender: [id number] in Treatment: 0 Vital Signs Height(in): 72 Pulse(bpm): 82 Weight(lbs): 156 Blood Pressure(mmHg): 116/72 Body Mass Index(BMI): 21 Temperature(F): 98.3 Respiratory Rate(breaths/min): 18 Photos: [3:No Photos Sacrum] [N/A:N/A N/A] Wound Location: [3:Gradually Appeared] [N/A:N/A] Wounding Event: [3:Pressure Ulcer] [N/A:N/A] Primary Etiology: [3:Paraplegia] [N/A:N/A] Comorbid History: [3:05/25/2019] [N/A:N/A] Date Acquired: [3:0] [N/A:N/A] Weeks of Treatment: [3:Open] [N/A:N/A] Wound Status: [3:2x1.5x1.1] [N/A:N/A] Measurements L x W x D (cm) [3:2.356] [N/A:N/A] A (cm) : rea [3:2.592] [N/A:N/A] Volume (cm) : [3:Category/Stage III] [N/A:N/A] Classification: [3:Medium] [N/A:N/A] Exudate A mount: [3:Serosanguineous] [N/A:N/A] Exudate Type: [3:red, brown] [N/A:N/A] Exudate Color: [3:Medium (34-66%)] [N/A:N/A] Granulation A mount: [3:Pink] [N/A:N/A] Granulation Quality: [3:Medium (34-66%)] [N/A:N/A] Necrotic Amount: [3:Fat Layer (Subcutaneous Tissue): Yes N/A] Exposed Structures: [3:Fascia: No Tendon: No Muscle: No Joint: No Bone: No None] [N/A:N/A] Epithelialization: [3:Debridement - Excisional] [N/A:N/A] Debridement: Pre-procedure Verification/Time Out 11:40 [N/A:N/A] Taken: [3:Lidocaine 4% Topical Solution] [N/A:N/A] Pain Control: [3:Subcutaneous, Slough] [N/A:N/A] Tissue Debrided: [3:Skin/Subcutaneous Tissue] [N/A:N/A] Level: [3:3] [N/A:N/A] Debridement A (sq cm):  [3:rea Curette] [N/A:N/A] Instrument: [3:Minimum] [N/A:N/A] Bleeding: [3:Pressure] [N/A:N/A] Hemostasis A chieved: [3:0] [N/A:N/A] Procedural Pain: [3:0] [N/A:N/A] Post Procedural Pain: [3:Procedure was tolerated well] [N/A:N/A] Debridement Treatment Response: [3:2x1.5x1.1] [N/A:N/A] Post Debridement Measurements L x W x D (cm) [3:2.592] [N/A:N/A] Post Debridement Volume: (cm) [3:Category/Stage III] [N/A:N/A] Post Debridement Stage: [3:Debridement] [N/A:N/A] Treatment Notes Wound #3 (Sacrum) 1. Cleanse With Saline 3. Primary Dressing Applied Hydrofera Blue 4. Secondary Dressing ABD Pad 5. Secured With Tape Notes Explained dressings to pt. and he states understanding Electronic Signature(s) Signed: 01/12/2020 5:14:03 PM By: [name] Sours MD Signed: 01/12/2020 5:38:55 PM By: [name] Ford Entered By: 05/27/2019 on 01/12/2020 12:39:07 -------------------------------------------------------------------------------- Multi-Disciplinary Care Plan Details Patient Name: Date of Service: [name] Ford, 01/14/2020 [name] Ford 01/12/2020 10:30 A M Medical Record Number: [name] Ford Patient Account Number: 01/14/2020 Date of Birth/Sex: Treating Ford: December 22, 1985 (34 y.o. M)  [name] Ford Primary Care Finnbar Cedillos: [name] Ford Other Clinician: Referring Diogenes Whirley: Treating Tinsley Lomas/Extender: [name] Ford in Treatment: 0 Active Inactive Electronic Signature(s) Signed: 01/26/2020 5:23:58 PM By: [name] Ford Previous Signature: 01/12/2020 5:38:55 PM Version By: [name] Ford Entered By: [name] Ford on 01/26/2020 17:23:57 -------------------------------------------------------------------------------- Pain Assessment Details Patient Name: Date of Service: [name] Ford [name] Ford 01/12/2020 10:30 A M Medical Record Number: [id number] Patient Account Number: [id number] Date of Birth/Sex: Treating Ford: May 19, 1985 (34 y.o. [name] Primary Care Minola Guin: [name] Ford Other  Clinician: Referring Kymora Sciara: Treating Willson Lipa/Extender: [name] Ford in Treatment: 0 Active Problems Location of Pain Severity and Description of Pain Patient Has Paino No Site Locations Pain Management and Medication Current Pain Management: Electronic Signature(s) Signed: 01/12/2020 5:30:22 PM By: [name] Ford Entered By: [name] Ford on 01/12/2020 11:34:17 -------------------------------------------------------------------------------- Patient/Caregiver Education Details Patient Name: Date of Service: [name] Ford, [name] Ford [name] Ford 11/18/2021andnbsp10:30 A M Medical Record Number: [id number] Patient Account Number: [id number] Date of Birth/Gender: Treating Ford: 03/24/1985 (35 y.o. [name] Ford Primary Care Physician: [name] Ford Other Clinician: Referring Physician: Treating Physician/Extender: [name] Ford in Treatment: 0 Education Assessment Education Provided To: Patient Education Topics Provided Welcome [facility]: o Handouts: Welcome [facility] o Methods: Explain/Verbal Responses: Reinforcements needed Electronic Signature(s) Signed: 01/12/2020 5:38:55 PM By: [name] Ford Entered By: [name] Ford on 01/12/2020 12:24:04 -------------------------------------------------------------------------------- Wound Assessment Details Patient Name: Date of Service: [name] Ford [name] Ford 01/12/2020 10:30 A M Medical Record Number: [id number] Patient Account Number: [id number] Date of Birth/Sex: Treating Ford: 1985-09-24 (34 y.o. [name] Ford) [name] Ford Primary Care Pope Brunty: [name] Ford Other Clinician: Referring Codylee Patil: Treating Peony Barner/Extender: [name] Ford in Treatment: 0 Wound Status Wound Number: 3 Primary Etiology: Pressure Ulcer Wound Location: Sacrum Wound Status: Open Wounding Event: Gradually Appeared Comorbid History: Paraplegia Date Acquired:  05/25/2019 Weeks Of Treatment: 0 Clustered Wound: No Wound Measurements Length: (cm) 2 Width: (cm) 1.5 Depth: (cm) 1.1 Area: (cm) 2.356 Volume: (cm) 2.592 % Reduction in Area: % Reduction in Volume: Epithelialization: None Tunneling: No Undermining: No Wound Description Classification: Category/Stage III Exudate Amount: Medium Exudate Type: Serosanguineous Exudate Color: red, brown Foul Odor After Cleansing: No Slough/Fibrino Yes Wound Bed Granulation Amount: Medium (34-66%) Exposed Structure Granulation Quality: Pink Fascia Exposed: No Necrotic Amount: Medium (34-66%) Fat Layer (Subcutaneous Tissue) Exposed: Yes Necrotic Quality: Adherent Slough Tendon Exposed: No Muscle Exposed: No Joint Exposed: No Bone Exposed: No Electronic Signature(s) Signed: 01/12/2020 5:30:22 PM By: [name] Ford Entered By: [name] Ford on 01/12/2020 11:33:59 -------------------------------------------------------------------------------- Vitals Details Patient Name: Date of Service: [name] Ford, [name] Ford [name] Ford 01/12/2020 10:30 A M Medical Record Number: [id number] Patient Account Number: [id number] Date of Birth/Sex: Treating Ford: 1985/05/31 (34 y.o. [name] Ford) [name] Ford Primary Care Glenn Gullickson: [name] Ford Other Clinician: Referring Lena Gores: Treating Kalika Smay/Extender: [name] Ford in Treatment: 0 Vital Signs Time Taken: 11:16 Temperature (F): 98.3 Height (in): 72 Pulse (bpm): 82 Source: Stated Respiratory Rate (breaths/min): 18 Weight (lbs): 156 Blood Pressure (mmHg): 116/72 Source: Stated Reference Range: 80 - 120 mg / dl Body Mass Index (BMI): 21.2 Electronic Signature(s) Signed: 01/12/2020 5:30:22 PM By: [name] Ford Entered By: [name] Ford on 01/12/2020 11:17:12

## 2020-01-16 ENCOUNTER — Ambulatory Visit: Payer: Medicaid Other | Admitting: Family Medicine

## 2020-01-17 ENCOUNTER — Telehealth: Payer: Self-pay | Admitting: Physical Medicine and Rehabilitation

## 2020-01-17 NOTE — Telephone Encounter (Signed)
Patient called stating she missed a call from you today, would like to speak to the doctor directly
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
## 2020-01-26 ENCOUNTER — Encounter (HOSPITAL_BASED_OUTPATIENT_CLINIC_OR_DEPARTMENT_OTHER): Payer: No Typology Code available for payment source | Admitting: Internal Medicine

## 2020-01-31 ENCOUNTER — Other Ambulatory Visit: Payer: Self-pay

## 2020-01-31 MED ORDER — OXYCODONE-ACETAMINOPHEN 7.5-325 MG PO TABS: 1.0000 | ORAL_TABLET | Freq: Four times a day (QID) | ORAL | 0 refills | Status: DC | PRN

## 2020-03-01 ENCOUNTER — Other Ambulatory Visit: Payer: Self-pay

## 2020-03-02 ENCOUNTER — Other Ambulatory Visit: Payer: Self-pay

## 2020-03-02 MED ORDER — OXYCODONE-ACETAMINOPHEN 7.5-325 MG PO TABS: 1.0000 | ORAL_TABLET | Freq: Four times a day (QID) | ORAL | 0 refills | Status: DC | PRN

## 2020-03-06 ENCOUNTER — Telehealth: Payer: Self-pay | Admitting: *Deleted

## 2020-03-06 DIAGNOSIS — Z79891 Long term (current) use of opiate analgesic: Secondary | ICD-10-CM

## 2020-03-06 DIAGNOSIS — G8921 Chronic pain due to trauma: Secondary | ICD-10-CM

## 2020-03-06 DIAGNOSIS — Z5181 Encounter for therapeutic drug level monitoring: Secondary | ICD-10-CM

## 2020-03-06 NOTE — Telephone Encounter (Signed)
I placed a call to Mr Mclean to request that he come in for his repeat drug screen. I informed him that he has 24 hours to complete the test from the call. He was asking "why he has to do that"? I told him we do not usually repeat drug screens at patients request, but that Dr Berline Chough has ok'd him to retest, and when we do allow retest it must be within the 24 hours after the phone call.

## 2020-03-07 NOTE — Telephone Encounter (Signed)
[name] Ford arrived at 1:15 for his repeat drug screen.  He is saying he is having problems with his appetite and would like to know if anything can be given to him to help increase his appetite.  I will forward to Dr Berline Chough.

## 2020-03-12 ENCOUNTER — Other Ambulatory Visit: Payer: Self-pay | Admitting: Physical Medicine and Rehabilitation

## 2020-03-12 LAB — DRUG TOX MONITOR 1 W/CONF, ORAL FLD

## 2020-03-12 LAB — DRUG TOX ALC METAB W/CON, ORAL FLD

## 2020-03-14 ENCOUNTER — Other Ambulatory Visit: Payer: Self-pay | Admitting: *Deleted

## 2020-03-14 NOTE — Telephone Encounter (Signed)
Received fax from patient's preferred pharmacy requesting refill off midodrine

## 2020-03-15 MED ORDER — MIDODRINE HCL 5 MG PO TABS
5.0000 mg | ORAL_TABLET | Freq: Three times a day (TID) | ORAL | 3 refills | Status: DC
Start: 2020-03-15 — End: 2020-03-27

## 2020-03-28 ENCOUNTER — Other Ambulatory Visit: Payer: Self-pay

## 2020-03-28 ENCOUNTER — Encounter
Payer: Medicaid Other | Attending: Physical Medicine and Rehabilitation | Admitting: Physical Medicine and Rehabilitation

## 2020-03-28 ENCOUNTER — Encounter: Payer: Self-pay | Admitting: Physical Medicine and Rehabilitation

## 2020-03-28 VITALS — BP 119/77 | HR 99 | Temp 98.1°F | Ht 72.0 in

## 2020-03-28 DIAGNOSIS — E43 Unspecified severe protein-calorie malnutrition: Secondary | ICD-10-CM | POA: Diagnosis present

## 2020-03-28 DIAGNOSIS — R252 Cramp and spasm: Secondary | ICD-10-CM | POA: Insufficient documentation

## 2020-03-28 DIAGNOSIS — F322 Major depressive disorder, single episode, severe without psychotic features: Secondary | ICD-10-CM | POA: Insufficient documentation

## 2020-03-28 DIAGNOSIS — G8921 Chronic pain due to trauma: Secondary | ICD-10-CM | POA: Insufficient documentation

## 2020-03-28 DIAGNOSIS — G8220 Paraplegia, unspecified: Secondary | ICD-10-CM | POA: Diagnosis present

## 2020-03-28 DIAGNOSIS — S31000S Unspecified open wound of lower back and pelvis without penetration into retroperitoneum, sequela: Secondary | ICD-10-CM | POA: Diagnosis present

## 2020-03-28 MED ORDER — MIRTAZAPINE 15 MG PO TABS: 15.0000 mg | ORAL_TABLET | Freq: Every day | ORAL | 5 refills | Status: DC

## 2020-03-28 MED ORDER — BACLOFEN 5 MG PO TABS: 5.0000 mg | ORAL_TABLET | Freq: Three times a day (TID) | ORAL | 5 refills | Status: DC

## 2020-03-28 MED ORDER — OXYCODONE-ACETAMINOPHEN 7.5-325 MG PO TABS: 1.0000 | ORAL_TABLET | Freq: Four times a day (QID) | ORAL | 0 refills | Status: DC | PRN

## 2020-03-28 NOTE — Patient Instructions (Addendum)
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 For malnutrition-  Remeron/Mirtazepine-  15 mg nightly- for sleep and appetite.   2. Sources of protein- beans, meat, nuts, yogurt, cheese, eggs,  icecream ( (make a shake out of Ensure and icecream and Vit D milk). Patients gain more weight- with snacking/grazing behavior. Also drink calories-    3. Wait on Duloxetine for nerve pain- cannot do both Remeron/and Cymbalta. .   4.  Do pressure relief every 15 minutes while in w/c - and q2 hours while in bed.    5.  Percocet 7.5 /325 mg up to 4x/day as needed for pain  6. Having AD- autonomic dysreflexia-  Due to the ulcer- due to pain below lesion. It's the cause of his sweating- so using rotating bed.   7. Restart baclofen for muscle spasticity and spasms- 5 mg 3x/day- take regularly- if doesn't help muscle tightness in back, call me.   8. Give treatment 2-3 weeks, and let me know.   9. F/U in 2 months-  Double appointment Jason's number [phone]
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
## 2020-04-03 ENCOUNTER — Other Ambulatory Visit: Payer: Self-pay

## 2020-04-03 DIAGNOSIS — M4628 Osteomyelitis of vertebra, sacral and sacrococcygeal region: Secondary | ICD-10-CM | POA: Insufficient documentation

## 2020-04-03 MED ORDER — OXYCODONE-ACETAMINOPHEN 7.5-325 MG PO TABS: 1.0000 | ORAL_TABLET | Freq: Four times a day (QID) | ORAL | 0 refills | Status: DC | PRN

## 2020-04-05 ENCOUNTER — Telehealth: Payer: Self-pay | Admitting: *Deleted

## 2020-04-05 NOTE — Telephone Encounter (Signed)
Transition Care Management Follow-up Telephone Call  Date of discharge and from where: 04/04/2020 [facility]  How have you been since you were released from the hospital? "I am doing fine"  Any questions or concerns? No  Items Reviewed:  Did the pt receive and understand the discharge instructions provided? Yes   Medications obtained and verified? Yes   Other? N/A  Any new allergies since your discharge? No   Dietary orders reviewed? Yes  Do you have support at home? Yes   Home Care and Equipment/Supplies: Were home health services ordered? not applicable If so, what is the name of the agency? N/A  Has the agency set up a time to come to the patient's home? not applicable Were any new equipment or medical supplies ordered?  No What is the name of the medical supply agency? N/A Were you able to get the supplies/equipment? not applicable Do you have any questions related to the use of the equipment or supplies? No  Functional Questionnaire: (I = Independent and D = Dependent) ADLs: D  Bathing/Dressing- D  Meal Prep- D  Eating- I  Maintaining continence- D  Transferring/Ambulation- D  Managing Meds- D  Follow up appointments reviewed:   PCP Hospital f/u appt confirmed? No  - pt states that he will have his significant other make this appointment for him  Specialist Hospital f/u appt confirmed? N/A   Are transportation arrangements needed? N/A  If their condition worsens, is the pt aware to call PCP or go to the Emergency Dept.? Yes  Was the patient provided with contact information for the PCP's office or ED? Yes  Was to pt encouraged to call back with questions or concerns? Yes

## 2020-04-20 ENCOUNTER — Other Ambulatory Visit: Payer: Self-pay | Admitting: Physical Medicine and Rehabilitation

## 2020-05-03 ENCOUNTER — Other Ambulatory Visit: Payer: Self-pay

## 2020-05-04 MED ORDER — OXYCODONE-ACETAMINOPHEN 7.5-325 MG PO TABS: 1.0000 | ORAL_TABLET | Freq: Four times a day (QID) | ORAL | 0 refills | Status: DC | PRN

## 2020-05-21 ENCOUNTER — Encounter
Payer: Medicaid Other | Attending: Physical Medicine and Rehabilitation | Admitting: Physical Medicine and Rehabilitation

## 2020-05-21 DIAGNOSIS — E43 Unspecified severe protein-calorie malnutrition: Secondary | ICD-10-CM | POA: Insufficient documentation

## 2020-05-21 DIAGNOSIS — R252 Cramp and spasm: Secondary | ICD-10-CM | POA: Insufficient documentation

## 2020-05-21 DIAGNOSIS — S31000S Unspecified open wound of lower back and pelvis without penetration into retroperitoneum, sequela: Secondary | ICD-10-CM | POA: Insufficient documentation

## 2020-05-21 DIAGNOSIS — G8220 Paraplegia, unspecified: Secondary | ICD-10-CM | POA: Insufficient documentation

## 2020-05-21 DIAGNOSIS — G8921 Chronic pain due to trauma: Secondary | ICD-10-CM | POA: Insufficient documentation

## 2020-05-21 DIAGNOSIS — F322 Major depressive disorder, single episode, severe without psychotic features: Secondary | ICD-10-CM | POA: Insufficient documentation

## 2020-06-04 ENCOUNTER — Other Ambulatory Visit: Payer: Self-pay

## 2020-06-04 MED ORDER — OXYCODONE-ACETAMINOPHEN 7.5-325 MG PO TABS: 1.0000 | ORAL_TABLET | Freq: Four times a day (QID) | ORAL | 0 refills | Status: DC | PRN

## 2020-06-19 ENCOUNTER — Other Ambulatory Visit: Payer: Self-pay | Admitting: Physical Medicine and Rehabilitation

## 2020-06-20 NOTE — Telephone Encounter (Signed)
Patient was a no-show last appointment and has no future scheduled appointments

## 2020-06-30 ENCOUNTER — Other Ambulatory Visit: Payer: Self-pay | Admitting: Physical Medicine and Rehabilitation

## 2020-07-03 ENCOUNTER — Other Ambulatory Visit: Payer: Self-pay

## 2020-07-04 MED ORDER — OXYCODONE-ACETAMINOPHEN 7.5-325 MG PO TABS: 1.0000 | ORAL_TABLET | Freq: Four times a day (QID) | ORAL | 0 refills | Status: DC | PRN

## 2020-07-04 NOTE — Telephone Encounter (Signed)
Patient no-showed 03/28 appointment.  No future appointments scheduled

## 2020-08-04 ENCOUNTER — Other Ambulatory Visit: Payer: Self-pay

## 2020-08-05 ENCOUNTER — Other Ambulatory Visit: Payer: Self-pay

## 2020-08-06 ENCOUNTER — Other Ambulatory Visit: Payer: Self-pay

## 2020-08-07 ENCOUNTER — Other Ambulatory Visit: Payer: Self-pay

## 2020-08-07 MED ORDER — OXYCODONE-ACETAMINOPHEN 7.5-325 MG PO TABS: 1.0000 | ORAL_TABLET | Freq: Four times a day (QID) | ORAL | 0 refills | Status: DC | PRN

## 2020-08-07 NOTE — Telephone Encounter (Signed)
Refill request for Oxycodone/APAP. Next appt 08/20/20. [facility]

## 2020-08-07 NOTE — Telephone Encounter (Signed)
Refilled medicine- is due- ML

## 2020-08-20 ENCOUNTER — Encounter
Payer: Medicaid Other | Attending: Physical Medicine and Rehabilitation | Admitting: Physical Medicine and Rehabilitation

## 2020-08-20 ENCOUNTER — Other Ambulatory Visit: Payer: Self-pay

## 2020-08-20 ENCOUNTER — Encounter: Payer: Self-pay | Admitting: Physical Medicine and Rehabilitation

## 2020-08-20 VITALS — BP 125/84 | HR 53 | Temp 98.1°F | Ht 72.0 in | Wt 155.0 lb

## 2020-08-20 DIAGNOSIS — G894 Chronic pain syndrome: Secondary | ICD-10-CM | POA: Diagnosis present

## 2020-08-20 DIAGNOSIS — Z993 Dependence on wheelchair: Secondary | ICD-10-CM | POA: Insufficient documentation

## 2020-08-20 DIAGNOSIS — Z5181 Encounter for therapeutic drug level monitoring: Secondary | ICD-10-CM | POA: Insufficient documentation

## 2020-08-20 DIAGNOSIS — G8220 Paraplegia, unspecified: Secondary | ICD-10-CM | POA: Diagnosis present

## 2020-08-20 DIAGNOSIS — Z79891 Long term (current) use of opiate analgesic: Secondary | ICD-10-CM | POA: Diagnosis present

## 2020-08-20 DIAGNOSIS — N319 Neuromuscular dysfunction of bladder, unspecified: Secondary | ICD-10-CM | POA: Diagnosis present

## 2020-08-20 MED ORDER — SILDENAFIL CITRATE 100 MG PO TABS: 100.0000 mg | ORAL_TABLET | Freq: Every day | ORAL | 11 refills | Status: AC | PRN
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
## 2020-08-25 LAB — DRUG TOX MONITOR 1 W/CONF, ORAL FLD
Amphetamines: NEGATIVE ng/mL (ref <10)
Barbiturates: NEGATIVE ng/mL (ref <10)
Benzodiazepines: NEGATIVE ng/mL (ref <0.50)
Benzoylecgonine: 6.2 ng/mL — ABNORMAL HIGH (ref <5.0)
Buprenorphine: NEGATIVE ng/mL (ref <0.10)
Cocaine: 12.9 ng/mL — ABNORMAL HIGH (ref <5.0)
Cocaine: POSITIVE ng/mL — AB (ref <5.0)
Codeine: NEGATIVE ng/mL (ref <2.5)
Cotinine: >250 ng/mL — ABNORMAL HIGH (ref <5.0)
Dihydrocodeine: NEGATIVE ng/mL (ref <2.5)
Fentanyl: 0.32 ng/mL — ABNORMAL HIGH (ref <0.10)
Fentanyl: POSITIVE ng/mL — AB (ref <0.10)
Heroin Metabolite: NEGATIVE ng/mL (ref <1.0)
Hydrocodone: NEGATIVE ng/mL (ref <2.5)
Hydromorphone: NEGATIVE ng/mL (ref <2.5)
MARIJUANA: POSITIVE ng/mL — AB (ref <2.5)
MDMA: NEGATIVE ng/mL (ref <10)
Meprobamate: NEGATIVE ng/mL (ref <2.5)
Methadone: NEGATIVE ng/mL (ref <5.0)
Morphine: NEGATIVE ng/mL (ref <2.5)
Nicotine Metabolite: POSITIVE ng/mL — AB (ref <5.0)
Norhydrocodone: NEGATIVE ng/mL (ref <2.5)
Noroxycodone: 62.5 ng/mL — ABNORMAL HIGH (ref <2.5)
Opiates: POSITIVE ng/mL — AB (ref <2.5)
Oxycodone: >250 ng/mL — ABNORMAL HIGH (ref <2.5)
Oxymorphone: NEGATIVE ng/mL (ref <2.5)
Phencyclidine: NEGATIVE ng/mL (ref <10)
THC: 244 ng/mL — ABNORMAL HIGH (ref <2.5)
Tapentadol: NEGATIVE ng/mL (ref <5.0)
Tramadol: NEGATIVE ng/mL (ref <5.0)
Zolpidem: NEGATIVE ng/mL (ref <5.0)

## 2020-08-25 LAB — DRUG TOX ALC METAB W/CON, ORAL FLD: Alcohol Metabolite: NEGATIVE ng/mL (ref <25)

## 2020-08-31 ENCOUNTER — Telehealth: Payer: Self-pay | Admitting: *Deleted

## 2020-08-31 NOTE — Telephone Encounter (Signed)
Oral swab was positive for cocaine, fentanyl, and marijuana, as well as his prescribed oxycodone. This is the second positive for these drugs for [name] Ford.

## 2020-09-03 NOTE — Telephone Encounter (Signed)
I spoke to Misty Stanley- he can still see me for SCI, but I will not prescribe for pain anymore- thank you, ML

## 2020-09-04 ENCOUNTER — Other Ambulatory Visit: Payer: Self-pay

## 2020-09-04 ENCOUNTER — Ambulatory Visit (HOSPITAL_COMMUNITY): Payer: Medicaid Other | Attending: Physical Medicine and Rehabilitation

## 2020-09-04 ENCOUNTER — Encounter (HOSPITAL_COMMUNITY): Payer: Self-pay

## 2020-09-04 DIAGNOSIS — G8221 Paraplegia, complete: Secondary | ICD-10-CM | POA: Insufficient documentation

## 2020-09-04 DIAGNOSIS — M6281 Muscle weakness (generalized): Secondary | ICD-10-CM

## 2020-09-04 DIAGNOSIS — S24112A Complete lesion at T2-T6 level of thoracic spinal cord, initial encounter: Secondary | ICD-10-CM | POA: Diagnosis not present

## 2020-09-04 DIAGNOSIS — R29898 Other symptoms and signs involving the musculoskeletal system: Secondary | ICD-10-CM

## 2020-09-04 DIAGNOSIS — G8220 Paraplegia, unspecified: Secondary | ICD-10-CM | POA: Diagnosis present
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
## 2020-09-10 NOTE — Telephone Encounter (Signed)
Letter sent via MyChart and USPS.

## 2020-09-12 ENCOUNTER — Ambulatory Visit (HOSPITAL_COMMUNITY): Payer: Medicaid Other | Admitting: Physical Therapy

## 2020-09-12 ENCOUNTER — Other Ambulatory Visit: Payer: Self-pay

## 2020-09-12 DIAGNOSIS — G8220 Paraplegia, unspecified: Secondary | ICD-10-CM

## 2020-09-12 DIAGNOSIS — M6281 Muscle weakness (generalized): Secondary | ICD-10-CM

## 2020-09-12 DIAGNOSIS — R29898 Other symptoms and signs involving the musculoskeletal system: Secondary | ICD-10-CM

## 2020-09-12 DIAGNOSIS — S24112A Complete lesion at T2-T6 level of thoracic spinal cord, initial encounter: Secondary | ICD-10-CM | POA: Diagnosis not present

## 2020-09-12 DIAGNOSIS — G8221 Paraplegia, complete: Secondary | ICD-10-CM
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
## 2020-09-18 ENCOUNTER — Encounter (HOSPITAL_COMMUNITY): Payer: Self-pay | Admitting: Physical Therapy

## 2020-09-18 ENCOUNTER — Telehealth (HOSPITAL_COMMUNITY): Payer: Self-pay | Admitting: Physical Therapy

## 2020-09-18 NOTE — Therapy (Signed)
[facility] [address] [place], [place], [address] Phone: [phone]   Fax:  [phone]  Patient Details  Name: [name] Ford MRN: [id number] Date of Birth: 29-Dec-1985 Referring Provider:  No ref. provider found  Encounter Date: 09/18/2020   Per Joanette Gula PT who works with spinal cord injury pt's.    We have used standing frames for T4 level injuries but mostly to assist with tone management, ROM, pressure relief, circulation, digestion, etc.  It really has no effect on bone health if you aren't getting muscle contraction and pulling on the bones.  We do have patients get standing frames or standing wheelchairs for home through one of our specialty wheelchair companies ([employer], [employer]) but they are typically not covered by insurance.      When you mention LE bracing, do you mean AFO or KAFOs??  I don't think I would pursue any LE bracing at this time since it is probably not the priority over sitting balance, transfers, bed mobility, w/c mobility, pressure relief training, ADLs, etc.  We have had a few people a few years out pursue KAFO's or RGO's for standing and ambulation BUT I don't usually encourage it for patients with that high of an injury because they lack a lot of the trunk control needed to use these independently.  They typically require a lot of assistance to use and are at a greater risk for LE fractures due to osteoporosis.  Virgina Organ, PT CLT [phone]  09/18/2020, 3:15 PM  [facility] [address] [place], [place], [address] Phone: [phone]   Fax:  [phone]

## 2020-09-18 NOTE — Telephone Encounter (Signed)
IF yes, cx all future appt - call Neuro to ask them to call pt and schedule tx's only.

## 2020-09-18 NOTE — Telephone Encounter (Signed)
S/w Mr. [name] Ford,  he wants to stay in [place] closure to home. He will talk with PT on Friday's visit and then make a decision if he wants to transfer to [facility] after that visit.

## 2020-09-18 NOTE — Telephone Encounter (Deleted)
Called no answer ask pt if [facility] .

## 2020-09-18 NOTE — Telephone Encounter (Deleted)
Called no answer -ask pt if they want to transfer to [facility].

## 2020-09-18 NOTE — Telephone Encounter (Deleted)
Called no answer ask pt if he wants to [facility] do [facility] . IF yes, cx all future appt and call Neuro to ask them to call patient and schedule tx's only.

## 2020-09-21 ENCOUNTER — Ambulatory Visit (HOSPITAL_COMMUNITY): Payer: Medicaid Other | Admitting: Physical Therapy

## 2020-09-21 NOTE — Telephone Encounter (Signed)
1st no show:  Called pt who stated that he was not feeling well.  Therapist reminded pt that his next appointment will be Wed.  Pt acknowledged.  Virgina Organ, PT CLT [phone]

## 2020-09-26 ENCOUNTER — Ambulatory Visit (HOSPITAL_COMMUNITY): Payer: Medicaid Other | Attending: Physical Medicine and Rehabilitation

## 2020-09-26 ENCOUNTER — Telehealth (HOSPITAL_COMMUNITY): Payer: Self-pay

## 2020-09-26 NOTE — Telephone Encounter (Signed)
No show #2, called with no answer and mailbox full.  Sent letter regarding no show policy to pt.'s address.  Cancelled remaining apts except for next apt.  Becky Sax, LPTA/CLT; [place] [phone]

## 2020-10-03 ENCOUNTER — Ambulatory Visit (HOSPITAL_COMMUNITY): Payer: Medicaid Other

## 2020-10-10 ENCOUNTER — Encounter (HOSPITAL_COMMUNITY): Payer: Medicaid Other

## 2020-10-10 ENCOUNTER — Other Ambulatory Visit: Payer: Self-pay | Admitting: Physical Medicine and Rehabilitation

## 2020-10-14 ENCOUNTER — Other Ambulatory Visit: Payer: Self-pay | Admitting: Physical Medicine and Rehabilitation

## 2020-10-16 ENCOUNTER — Ambulatory Visit: Payer: Medicaid Other | Admitting: Urology

## 2020-10-17 ENCOUNTER — Encounter (HOSPITAL_COMMUNITY): Payer: Self-pay

## 2020-10-17 ENCOUNTER — Ambulatory Visit (HOSPITAL_COMMUNITY): Payer: Medicaid Other | Admitting: Physical Therapy

## 2020-10-23 ENCOUNTER — Encounter (HOSPITAL_COMMUNITY): Payer: Medicaid Other | Admitting: Physical Therapy

## 2020-11-01 ENCOUNTER — Encounter (HOSPITAL_COMMUNITY): Payer: Medicaid Other | Admitting: Physical Therapy

## 2020-11-07 ENCOUNTER — Encounter (HOSPITAL_COMMUNITY): Payer: Medicaid Other | Admitting: Physical Therapy

## 2020-11-13 ENCOUNTER — Encounter (HOSPITAL_COMMUNITY): Payer: Medicaid Other

## 2020-11-16 NOTE — Addendum Note (Signed)
Addended by: Doreene Eland on: 11/16/2020 05:08 PM   Modules accepted: Orders

## 2020-11-20 ENCOUNTER — Encounter (HOSPITAL_COMMUNITY): Payer: Medicaid Other

## 2020-11-20 ENCOUNTER — Ambulatory Visit (INDEPENDENT_AMBULATORY_CARE_PROVIDER_SITE_OTHER): Payer: Medicaid Other | Admitting: Podiatry

## 2020-11-20 ENCOUNTER — Other Ambulatory Visit: Payer: Self-pay

## 2020-11-20 DIAGNOSIS — L602 Onychogryphosis: Secondary | ICD-10-CM

## 2020-11-20 DIAGNOSIS — B351 Tinea unguium: Secondary | ICD-10-CM | POA: Diagnosis not present

## 2020-11-20 DIAGNOSIS — M79675 Pain in left toe(s): Secondary | ICD-10-CM

## 2020-11-20 DIAGNOSIS — G8220 Paraplegia, unspecified: Secondary | ICD-10-CM | POA: Diagnosis not present

## 2020-11-20 DIAGNOSIS — M79674 Pain in right toe(s): Secondary | ICD-10-CM

## 2020-11-20 DIAGNOSIS — R200 Anesthesia of skin: Secondary | ICD-10-CM

## 2020-11-20 MED ORDER — TERBINAFINE HCL 250 MG PO TABS: 250.0000 mg | ORAL_TABLET | Freq: Every day | ORAL | 0 refills | Status: AC

## 2020-11-20 NOTE — Patient Instructions (Signed)
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
## 2020-11-23 ENCOUNTER — Encounter: Payer: Medicaid Other | Admitting: Physical Medicine and Rehabilitation

## 2020-11-27 ENCOUNTER — Encounter (HOSPITAL_COMMUNITY): Payer: Medicaid Other

## 2020-12-04 ENCOUNTER — Encounter (HOSPITAL_COMMUNITY): Payer: Medicaid Other

## 2020-12-18 ENCOUNTER — Ambulatory Visit: Payer: Medicaid Other | Admitting: Urology

## 2021-02-22 ENCOUNTER — Ambulatory Visit: Payer: Self-pay | Admitting: Physical Medicine and Rehabilitation

## 2021-03-08 ENCOUNTER — Other Ambulatory Visit: Payer: Self-pay | Admitting: Physical Medicine and Rehabilitation

## 2021-03-20 ENCOUNTER — Ambulatory Visit: Payer: No Typology Code available for payment source | Admitting: Family Medicine

## 2021-03-26 ENCOUNTER — Ambulatory Visit: Payer: Medicaid Other | Admitting: Podiatry

## 2021-04-18 ENCOUNTER — Ambulatory Visit: Payer: No Typology Code available for payment source | Admitting: Family Medicine

## 2021-04-25 ENCOUNTER — Telehealth: Payer: Self-pay | Admitting: *Deleted

## 2021-04-25 NOTE — Telephone Encounter (Signed)
He also needs his midodrine refilled.  ?

## 2021-04-25 NOTE — Telephone Encounter (Signed)
[name] Ford called about needing his catheters ordered. He said he had been incarcerated.  The fax is in your folder.  He cancelled his December appt and does not have one scheduled so I told Renea to make him one. ?

## 2021-04-26 MED ORDER — MIDODRINE HCL 5 MG PO TABS: ORAL_TABLET | ORAL | 1 refills | Status: DC

## 2021-06-14 ENCOUNTER — Other Ambulatory Visit: Payer: Self-pay | Admitting: Physical Medicine and Rehabilitation

## 2021-08-12 ENCOUNTER — Encounter
Payer: Medicaid Other | Attending: Physical Medicine and Rehabilitation | Admitting: Physical Medicine and Rehabilitation

## 2021-08-14 ENCOUNTER — Other Ambulatory Visit: Payer: Self-pay | Admitting: Physical Medicine and Rehabilitation

## 2021-10-15 ENCOUNTER — Telehealth: Payer: Self-pay | Admitting: Physical Medicine and Rehabilitation

## 2021-10-15 NOTE — Telephone Encounter (Signed)
Patient is needing a new air mattress.  The one he has has went out.  His CAP program said that we could send a new order into [place].

## 2021-10-16 ENCOUNTER — Telehealth: Payer: Self-pay

## 2021-10-16 NOTE — Telephone Encounter (Signed)
The number attached goes directly to fax machine- I have attempted ot call- but cannot get through on fax line- I haven't seen pt in 1 year, I will not be able to fill out paperwork for new air matress if I haven't seen him- legally- ML

## 2021-10-16 NOTE — Telephone Encounter (Signed)
Havne't seen [name] Ford in > 1 year- he needs to be be seen to be able to order anything more for DME- per DME requirements- ML

## 2021-10-16 NOTE — Telephone Encounter (Signed)
[name] Ford called and advised order is needed for hospital bed mattress to [phone]

## 2021-10-16 NOTE — Telephone Encounter (Signed)
[name] Ford returned call and stated the patient has an appointment in December and he really needs the hospital bed. Please advise

## 2021-11-16 ENCOUNTER — Other Ambulatory Visit: Payer: Self-pay | Admitting: Physical Medicine and Rehabilitation

## 2021-12-10 ENCOUNTER — Other Ambulatory Visit: Payer: Self-pay | Admitting: Physical Medicine and Rehabilitation

## 2021-12-10 NOTE — Telephone Encounter (Signed)
Pt cannot have  a refill- it shows approved- please refuse this medicine- please see above- ML

## 2021-12-11 NOTE — Addendum Note (Signed)
Addended by: Dessa Phi D on: 12/11/2021 10:46 AM   Modules accepted: Orders

## 2022-01-15 ENCOUNTER — Other Ambulatory Visit: Payer: Self-pay | Admitting: Physical Medicine and Rehabilitation

## 2022-01-24 ENCOUNTER — Encounter: Payer: Medicaid Other | Admitting: Physical Medicine and Rehabilitation

## 2022-02-19 ENCOUNTER — Other Ambulatory Visit: Payer: Self-pay | Admitting: Physical Medicine and Rehabilitation

## 2022-03-13 ENCOUNTER — Other Ambulatory Visit: Payer: Self-pay | Admitting: Physical Medicine and Rehabilitation

## 2022-05-16 ENCOUNTER — Other Ambulatory Visit: Payer: Self-pay | Admitting: Physical Medicine and Rehabilitation

## 2022-05-21 ENCOUNTER — Other Ambulatory Visit: Payer: Self-pay | Admitting: Physical Medicine and Rehabilitation

## 2022-06-13 ENCOUNTER — Other Ambulatory Visit: Payer: Self-pay | Admitting: Physical Medicine and Rehabilitation

## 2022-07-02 ENCOUNTER — Ambulatory Visit: Payer: Medicaid Other | Admitting: Urology

## 2022-08-17 ENCOUNTER — Other Ambulatory Visit: Payer: Self-pay | Admitting: Physical Medicine and Rehabilitation

## 2022-08-18 NOTE — Telephone Encounter (Signed)
Will not refill- since haven't seen pt in >1 year ML

## 2022-09-15 ENCOUNTER — Other Ambulatory Visit: Payer: Self-pay | Admitting: Physical Medicine and Rehabilitation

## 2023-04-06 ENCOUNTER — Ambulatory Visit (INDEPENDENT_AMBULATORY_CARE_PROVIDER_SITE_OTHER): Payer: Medicaid Other | Admitting: Podiatry

## 2023-04-06 DIAGNOSIS — Z91199 Patient's noncompliance with other medical treatment and regimen due to unspecified reason: Secondary | ICD-10-CM

## 2023-04-06 NOTE — Progress Notes (Signed)
 Patient was no-show for appointment today

## 2023-04-14 ENCOUNTER — Other Ambulatory Visit: Payer: Self-pay

## 2023-04-14 ENCOUNTER — Ambulatory Visit (HOSPITAL_COMMUNITY): Payer: Medicaid Other | Attending: Internal Medicine | Admitting: Physical Therapy

## 2023-04-14 DIAGNOSIS — L89153 Pressure ulcer of sacral region, stage 3: Secondary | ICD-10-CM | POA: Diagnosis present
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
## 2023-04-17 ENCOUNTER — Ambulatory Visit (HOSPITAL_COMMUNITY): Payer: Medicaid Other | Admitting: Physical Therapy

## 2023-04-21 ENCOUNTER — Ambulatory Visit (HOSPITAL_COMMUNITY): Payer: Medicaid Other | Admitting: Physical Therapy

## 2023-04-21 DIAGNOSIS — L89153 Pressure ulcer of sacral region, stage 3: Secondary | ICD-10-CM
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
## 2023-04-24 ENCOUNTER — Ambulatory Visit (HOSPITAL_COMMUNITY): Payer: Medicaid Other | Admitting: Physical Therapy

## 2023-04-28 ENCOUNTER — Telehealth (HOSPITAL_COMMUNITY): Payer: Self-pay | Admitting: Physical Therapy

## 2023-04-28 ENCOUNTER — Ambulatory Visit (HOSPITAL_COMMUNITY): Payer: Medicaid Other | Admitting: Physical Therapy

## 2023-04-28 NOTE — Telephone Encounter (Signed)
 First no show:  Pt called re no show.  Pt stated that he was on his way, however, he is already 25 minutes late.  Pt requests to be called tomorrow if he can be fit in.  Therapist relayed message to the front desk.  Virgina Organ, PT CLT [phone]

## 2023-04-29 ENCOUNTER — Encounter (HOSPITAL_COMMUNITY): Payer: Self-pay

## 2023-04-29 ENCOUNTER — Ambulatory Visit (HOSPITAL_COMMUNITY): Payer: Self-pay | Attending: Internal Medicine

## 2023-04-29 DIAGNOSIS — L89153 Pressure ulcer of sacral region, stage 3: Secondary | ICD-10-CM | POA: Insufficient documentation
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
## 2023-05-01 ENCOUNTER — Ambulatory Visit (HOSPITAL_COMMUNITY): Payer: Medicaid Other

## 2023-05-05 ENCOUNTER — Ambulatory Visit (HOSPITAL_COMMUNITY): Payer: Medicaid Other | Admitting: Physical Therapy

## 2023-05-05 DIAGNOSIS — L89153 Pressure ulcer of sacral region, stage 3: Secondary | ICD-10-CM | POA: Diagnosis not present
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
## 2023-05-08 ENCOUNTER — Ambulatory Visit (HOSPITAL_COMMUNITY): Payer: Medicaid Other | Admitting: Physical Therapy

## 2023-05-12 ENCOUNTER — Ambulatory Visit (HOSPITAL_COMMUNITY): Payer: Medicaid Other | Admitting: Physical Therapy

## 2023-05-15 ENCOUNTER — Ambulatory Visit (HOSPITAL_COMMUNITY): Payer: Medicaid Other

## 2023-05-19 ENCOUNTER — Ambulatory Visit (HOSPITAL_COMMUNITY): Payer: Medicaid Other

## 2023-05-22 ENCOUNTER — Ambulatory Visit (HOSPITAL_COMMUNITY): Payer: Medicaid Other | Admitting: Physical Therapy
